# Patient Record
Sex: Female | Born: 1935 | Race: White | Hispanic: No | Smoking: Former smoker
Health system: Southern US, Community
[De-identification: ages and names within clinical notes are randomized; demographics above are authoritative.]

## PROBLEM LIST (undated history)

## (undated) DIAGNOSIS — G510 Bell's palsy: Secondary | ICD-10-CM

## (undated) DIAGNOSIS — Z9221 Personal history of antineoplastic chemotherapy: Secondary | ICD-10-CM

## (undated) DIAGNOSIS — Z7901 Long term (current) use of anticoagulants: Secondary | ICD-10-CM

## (undated) DIAGNOSIS — D689 Coagulation defect, unspecified: Secondary | ICD-10-CM

## (undated) DIAGNOSIS — I739 Peripheral vascular disease, unspecified: Secondary | ICD-10-CM

## (undated) DIAGNOSIS — I509 Heart failure, unspecified: Secondary | ICD-10-CM

## (undated) DIAGNOSIS — K529 Noninfective gastroenteritis and colitis, unspecified: Secondary | ICD-10-CM

## (undated) DIAGNOSIS — I480 Paroxysmal atrial fibrillation: Secondary | ICD-10-CM

## (undated) DIAGNOSIS — Z5189 Encounter for other specified aftercare: Secondary | ICD-10-CM

## (undated) DIAGNOSIS — K219 Gastro-esophageal reflux disease without esophagitis: Secondary | ICD-10-CM

## (undated) DIAGNOSIS — K922 Gastrointestinal hemorrhage, unspecified: Secondary | ICD-10-CM

## (undated) DIAGNOSIS — Z86711 Personal history of pulmonary embolism: Secondary | ICD-10-CM

## (undated) DIAGNOSIS — C3490 Malignant neoplasm of unspecified part of unspecified bronchus or lung: Secondary | ICD-10-CM

## (undated) DIAGNOSIS — E119 Type 2 diabetes mellitus without complications: Secondary | ICD-10-CM

## (undated) DIAGNOSIS — I10 Essential (primary) hypertension: Secondary | ICD-10-CM

## (undated) DIAGNOSIS — Z923 Personal history of irradiation: Secondary | ICD-10-CM

## (undated) DIAGNOSIS — C801 Malignant (primary) neoplasm, unspecified: Secondary | ICD-10-CM

## (undated) HISTORY — PX: CARPAL TUNNEL RELEASE: SHX101

## (undated) HISTORY — DX: Encounter for other specified aftercare: Z51.89

## (undated) HISTORY — DX: Gastro-esophageal reflux disease without esophagitis: K21.9

## (undated) HISTORY — PX: BLADDER SURGERY: SHX569

## (undated) HISTORY — DX: Noninfective gastroenteritis and colitis, unspecified: K52.9

## (undated) HISTORY — DX: Gastrointestinal hemorrhage, unspecified: K92.2

## (undated) HISTORY — PX: ABDOMINAL HYSTERECTOMY: SHX81

## (undated) HISTORY — DX: Malignant neoplasm of unspecified part of unspecified bronchus or lung: C34.90

## (undated) HISTORY — PX: OTHER SURGICAL HISTORY: SHX169

## (undated) HISTORY — PX: LUMBAR SPINE SURGERY: SHX701

## (undated) HISTORY — DX: Peripheral vascular disease, unspecified: I73.9

## (undated) HISTORY — DX: Coagulation defect, unspecified: D68.9

## (undated) HISTORY — DX: Paroxysmal atrial fibrillation: I48.0

## (undated) HISTORY — DX: Long term (current) use of anticoagulants: Z79.01

## (undated) HISTORY — DX: Personal history of antineoplastic chemotherapy: Z92.21

## (undated) HISTORY — PX: APPENDECTOMY: SHX54

## (undated) HISTORY — DX: Personal history of irradiation: Z92.3

## (undated) HISTORY — DX: Bell's palsy: G51.0

## (undated) HISTORY — DX: Personal history of pulmonary embolism: Z86.711

---

## 1997-12-31 ENCOUNTER — Emergency Department (HOSPITAL_COMMUNITY): Admission: EM | Admit: 1997-12-31 | Discharge: 1997-12-31 | Payer: Self-pay | Admitting: Emergency Medicine

## 1999-06-12 HISTORY — PX: CORONARY ANGIOPLASTY WITH STENT PLACEMENT: SHX49

## 2000-01-04 ENCOUNTER — Encounter: Payer: Self-pay | Admitting: Cardiovascular Disease

## 2000-01-04 ENCOUNTER — Ambulatory Visit: Admission: RE | Admit: 2000-01-04 | Discharge: 2000-01-04 | Payer: Self-pay | Admitting: Cardiovascular Disease

## 2000-01-30 ENCOUNTER — Observation Stay (HOSPITAL_COMMUNITY): Admission: RE | Admit: 2000-01-30 | Discharge: 2000-01-31 | Payer: Self-pay | Admitting: Cardiovascular Disease

## 2001-03-02 ENCOUNTER — Emergency Department (HOSPITAL_COMMUNITY): Admission: EM | Admit: 2001-03-02 | Discharge: 2001-03-02 | Payer: Self-pay | Admitting: Emergency Medicine

## 2002-11-02 ENCOUNTER — Ambulatory Visit (HOSPITAL_BASED_OUTPATIENT_CLINIC_OR_DEPARTMENT_OTHER): Admission: RE | Admit: 2002-11-02 | Discharge: 2002-11-02 | Payer: Self-pay | Admitting: Cardiovascular Disease

## 2003-04-12 HISTORY — PX: SHOULDER OPEN ROTATOR CUFF REPAIR: SHX2407

## 2003-04-20 ENCOUNTER — Encounter: Admission: RE | Admit: 2003-04-20 | Discharge: 2003-04-20 | Payer: Self-pay | Admitting: Orthopedic Surgery

## 2003-04-21 ENCOUNTER — Ambulatory Visit (HOSPITAL_COMMUNITY): Admission: RE | Admit: 2003-04-21 | Discharge: 2003-04-21 | Payer: Self-pay | Admitting: Orthopedic Surgery

## 2003-04-21 ENCOUNTER — Ambulatory Visit (HOSPITAL_BASED_OUTPATIENT_CLINIC_OR_DEPARTMENT_OTHER): Admission: RE | Admit: 2003-04-21 | Discharge: 2003-04-21 | Payer: Self-pay | Admitting: Orthopedic Surgery

## 2004-06-11 HISTORY — PX: OTHER SURGICAL HISTORY: SHX169

## 2004-12-24 ENCOUNTER — Emergency Department (HOSPITAL_COMMUNITY): Admission: EM | Admit: 2004-12-24 | Discharge: 2004-12-24 | Payer: Self-pay | Admitting: Emergency Medicine

## 2005-02-01 ENCOUNTER — Inpatient Hospital Stay (HOSPITAL_COMMUNITY): Admission: RE | Admit: 2005-02-01 | Discharge: 2005-02-05 | Payer: Self-pay | Admitting: Specialist

## 2005-04-09 ENCOUNTER — Encounter: Admission: RE | Admit: 2005-04-09 | Discharge: 2005-04-09 | Payer: Self-pay | Admitting: Specialist

## 2005-04-10 ENCOUNTER — Inpatient Hospital Stay (HOSPITAL_COMMUNITY): Admission: EM | Admit: 2005-04-10 | Discharge: 2005-04-23 | Payer: Self-pay | Admitting: Emergency Medicine

## 2005-04-11 ENCOUNTER — Ambulatory Visit: Payer: Self-pay | Admitting: Pulmonary Disease

## 2005-04-16 ENCOUNTER — Encounter (INDEPENDENT_AMBULATORY_CARE_PROVIDER_SITE_OTHER): Payer: Self-pay | Admitting: *Deleted

## 2005-04-19 ENCOUNTER — Ambulatory Visit: Payer: Self-pay | Admitting: Oncology

## 2005-04-24 ENCOUNTER — Ambulatory Visit: Payer: Self-pay | Admitting: Oncology

## 2005-05-24 ENCOUNTER — Ambulatory Visit (HOSPITAL_COMMUNITY): Admission: RE | Admit: 2005-05-24 | Discharge: 2005-05-24 | Payer: Self-pay | Admitting: Oncology

## 2005-06-11 HISTORY — PX: OTHER SURGICAL HISTORY: SHX169

## 2005-06-13 ENCOUNTER — Ambulatory Visit: Payer: Self-pay | Admitting: Oncology

## 2005-06-14 ENCOUNTER — Ambulatory Visit (HOSPITAL_COMMUNITY): Admission: RE | Admit: 2005-06-14 | Discharge: 2005-06-14 | Payer: Self-pay | Admitting: Thoracic Surgery

## 2005-07-18 ENCOUNTER — Encounter: Admission: RE | Admit: 2005-07-18 | Discharge: 2005-07-18 | Payer: Self-pay | Admitting: Thoracic Surgery

## 2005-07-30 ENCOUNTER — Ambulatory Visit: Payer: Self-pay | Admitting: Oncology

## 2005-08-09 ENCOUNTER — Inpatient Hospital Stay (HOSPITAL_COMMUNITY): Admission: RE | Admit: 2005-08-09 | Discharge: 2005-08-15 | Payer: Self-pay | Admitting: Thoracic Surgery

## 2005-08-09 ENCOUNTER — Ambulatory Visit: Payer: Self-pay | Admitting: Oncology

## 2005-08-09 ENCOUNTER — Encounter (INDEPENDENT_AMBULATORY_CARE_PROVIDER_SITE_OTHER): Payer: Self-pay | Admitting: *Deleted

## 2005-08-22 ENCOUNTER — Encounter: Admission: RE | Admit: 2005-08-22 | Discharge: 2005-08-22 | Payer: Self-pay | Admitting: Thoracic Surgery

## 2005-09-05 ENCOUNTER — Encounter: Admission: RE | Admit: 2005-09-05 | Discharge: 2005-09-05 | Payer: Self-pay | Admitting: Thoracic Surgery

## 2005-09-14 ENCOUNTER — Ambulatory Visit: Payer: Self-pay | Admitting: Oncology

## 2005-09-21 LAB — CBC WITH DIFFERENTIAL/PLATELET
Basophils Absolute: 0 10*3/uL (ref 0.0–0.1)
HCT: 40.4 % (ref 34.8–46.6)
HGB: 13.5 g/dL (ref 11.6–15.9)
MCH: 30.9 pg (ref 26.0–34.0)
MONO#: 0.7 10*3/uL (ref 0.1–0.9)
NEUT%: 69.6 % (ref 39.6–76.8)
WBC: 10.4 10*3/uL — ABNORMAL HIGH (ref 3.9–10.0)
lymph#: 2.2 10*3/uL (ref 0.9–3.3)

## 2005-09-21 LAB — PROTIME-INR

## 2005-10-01 LAB — PROTIME-INR: INR: 2.7 (ref 2.00–3.50)

## 2005-10-08 LAB — PROTIME-INR: Protime: 16.1 Seconds — ABNORMAL HIGH (ref 10.6–13.4)

## 2005-10-19 LAB — COMPREHENSIVE METABOLIC PANEL
ALT: 18 U/L (ref 0–40)
AST: 20 U/L (ref 0–37)
Albumin: 4 g/dL (ref 3.5–5.2)
Alkaline Phosphatase: 97 U/L (ref 39–117)
BUN: 10 mg/dL (ref 6–23)
Calcium: 9.5 mg/dL (ref 8.4–10.5)
Chloride: 103 mEq/L (ref 96–112)
Potassium: 4.2 mEq/L (ref 3.5–5.3)
Sodium: 140 mEq/L (ref 135–145)

## 2005-10-19 LAB — CBC WITH DIFFERENTIAL/PLATELET
BASO%: 0.3 % (ref 0.0–2.0)
EOS%: 1.7 % (ref 0.0–7.0)
HCT: 41.4 % (ref 34.8–46.6)
LYMPH%: 20.9 % (ref 14.0–48.0)
MCH: 29.6 pg (ref 26.0–34.0)
MCHC: 33.1 g/dL (ref 32.0–36.0)
MONO%: 7 % (ref 0.0–13.0)
NEUT%: 70.1 % (ref 39.6–76.8)
Platelets: 237 10*3/uL (ref 145–400)
RBC: 4.63 10*6/uL (ref 3.70–5.32)
WBC: 8.2 10*3/uL (ref 3.9–10.0)

## 2005-10-19 LAB — PROTIME-INR
INR: 2 (ref 2.00–3.50)
Protime: 17.6 Seconds — ABNORMAL HIGH (ref 10.6–13.4)

## 2005-10-24 ENCOUNTER — Encounter: Admission: RE | Admit: 2005-10-24 | Discharge: 2005-10-24 | Payer: Self-pay | Admitting: Thoracic Surgery

## 2005-10-31 ENCOUNTER — Ambulatory Visit: Payer: Self-pay | Admitting: Oncology

## 2005-11-02 ENCOUNTER — Ambulatory Visit (HOSPITAL_COMMUNITY): Admission: RE | Admit: 2005-11-02 | Discharge: 2005-11-02 | Payer: Self-pay | Admitting: Oncology

## 2005-11-02 LAB — PROTIME-INR: INR: 2.7 (ref 2.00–3.50)

## 2005-11-16 LAB — CBC WITH DIFFERENTIAL/PLATELET
BASO%: 0.2 % (ref 0.0–2.0)
Eosinophils Absolute: 0.1 10*3/uL (ref 0.0–0.5)
HCT: 41 % (ref 34.8–46.6)
LYMPH%: 22.7 % (ref 14.0–48.0)
MONO#: 0.6 10*3/uL (ref 0.1–0.9)
NEUT#: 7.2 10*3/uL — ABNORMAL HIGH (ref 1.5–6.5)
NEUT%: 70.1 % (ref 39.6–76.8)
Platelets: 246 10*3/uL (ref 145–400)
WBC: 10.2 10*3/uL — ABNORMAL HIGH (ref 3.9–10.0)
lymph#: 2.3 10*3/uL (ref 0.9–3.3)

## 2005-11-16 LAB — COMPREHENSIVE METABOLIC PANEL
ALT: 13 U/L (ref 0–40)
Albumin: 4.2 g/dL (ref 3.5–5.2)
Alkaline Phosphatase: 105 U/L (ref 39–117)
CO2: 28 mEq/L (ref 19–32)
Glucose, Bld: 141 mg/dL — ABNORMAL HIGH (ref 70–99)
Potassium: 4.1 mEq/L (ref 3.5–5.3)
Sodium: 140 mEq/L (ref 135–145)
Total Bilirubin: 0.4 mg/dL (ref 0.3–1.2)
Total Protein: 6.6 g/dL (ref 6.0–8.3)

## 2005-11-16 LAB — PROTIME-INR: Protime: 16.4 Seconds — ABNORMAL HIGH (ref 10.6–13.4)

## 2006-01-10 ENCOUNTER — Ambulatory Visit: Payer: Self-pay | Admitting: Oncology

## 2006-01-14 LAB — PROTIME-INR
INR: 2.2 (ref 2.00–3.50)
Protime: 18.2 Seconds — ABNORMAL HIGH (ref 10.6–13.4)

## 2006-01-14 LAB — CBC WITH DIFFERENTIAL/PLATELET
Basophils Absolute: 0 10*3/uL (ref 0.0–0.1)
EOS%: 2 % (ref 0.0–7.0)
HGB: 13.7 g/dL (ref 11.6–15.9)
MCH: 29.8 pg (ref 26.0–34.0)
MCV: 87.5 fL (ref 81.0–101.0)
MONO%: 5.7 % (ref 0.0–13.0)
NEUT#: 7.3 10*3/uL — ABNORMAL HIGH (ref 1.5–6.5)
RBC: 4.62 10*6/uL (ref 3.70–5.32)
RDW: 17.4 % — ABNORMAL HIGH (ref 11.3–14.5)
lymph#: 2.1 10*3/uL (ref 0.9–3.3)

## 2006-01-14 LAB — COMPREHENSIVE METABOLIC PANEL
ALT: 21 U/L (ref 0–40)
AST: 16 U/L (ref 0–37)
Alkaline Phosphatase: 94 U/L (ref 39–117)
BUN: 18 mg/dL (ref 6–23)
Creatinine, Ser: 0.89 mg/dL (ref 0.40–1.20)
Total Bilirubin: 0.4 mg/dL (ref 0.3–1.2)

## 2006-01-16 ENCOUNTER — Encounter: Admission: RE | Admit: 2006-01-16 | Discharge: 2006-01-16 | Payer: Self-pay | Admitting: Thoracic Surgery

## 2006-03-07 ENCOUNTER — Ambulatory Visit: Payer: Self-pay | Admitting: Oncology

## 2006-03-11 LAB — PROTIME-INR
INR: 1.7 — ABNORMAL LOW (ref 2.00–3.50)
Protime: 20.4 Seconds — ABNORMAL HIGH (ref 10.6–13.4)

## 2006-03-11 LAB — CBC WITH DIFFERENTIAL/PLATELET
BASO%: 0.3 % (ref 0.0–2.0)
EOS%: 2.8 % (ref 0.0–7.0)
Eosinophils Absolute: 0.3 10*3/uL (ref 0.0–0.5)
LYMPH%: 18.7 % (ref 14.0–48.0)
MCHC: 34 g/dL (ref 32.0–36.0)
MCV: 89.7 fL (ref 81.0–101.0)
MONO%: 5 % (ref 0.0–13.0)
NEUT#: 8.3 10*3/uL — ABNORMAL HIGH (ref 1.5–6.5)
Platelets: 236 10*3/uL (ref 145–400)
RBC: 4.59 10*6/uL (ref 3.70–5.32)
RDW: 14.9 % — ABNORMAL HIGH (ref 11.3–14.5)

## 2006-03-11 LAB — COMPREHENSIVE METABOLIC PANEL
ALT: 20 U/L (ref 0–40)
AST: 16 U/L (ref 0–37)
Albumin: 4 g/dL (ref 3.5–5.2)
BUN: 19 mg/dL (ref 6–23)
CO2: 24 mEq/L (ref 19–32)
Calcium: 9.3 mg/dL (ref 8.4–10.5)
Chloride: 102 mEq/L (ref 96–112)
Creatinine, Ser: 0.93 mg/dL (ref 0.40–1.20)
Potassium: 3.8 mEq/L (ref 3.5–5.3)

## 2006-03-11 LAB — LACTATE DEHYDROGENASE: LDH: 113 U/L (ref 94–250)

## 2006-05-23 ENCOUNTER — Ambulatory Visit: Payer: Self-pay | Admitting: Oncology

## 2006-05-27 LAB — CBC WITH DIFFERENTIAL/PLATELET
BASO%: 0.4 % (ref 0.0–2.0)
Eosinophils Absolute: 0.2 10*3/uL (ref 0.0–0.5)
HCT: 41.9 % (ref 34.8–46.6)
MCHC: 33.4 g/dL (ref 32.0–36.0)
MONO#: 0.4 10*3/uL (ref 0.1–0.9)
NEUT#: 6.5 10*3/uL (ref 1.5–6.5)
RBC: 4.67 10*6/uL (ref 3.70–5.32)
WBC: 9.5 10*3/uL (ref 3.9–10.0)
lymph#: 2.3 10*3/uL (ref 0.9–3.3)

## 2006-05-27 LAB — COMPREHENSIVE METABOLIC PANEL
AST: 15 U/L (ref 0–37)
Albumin: 4 g/dL (ref 3.5–5.2)
Alkaline Phosphatase: 103 U/L (ref 39–117)
BUN: 18 mg/dL (ref 6–23)
Calcium: 8.8 mg/dL (ref 8.4–10.5)
Chloride: 102 mEq/L (ref 96–112)
Glucose, Bld: 150 mg/dL — ABNORMAL HIGH (ref 70–99)
Potassium: 4.1 mEq/L (ref 3.5–5.3)
Sodium: 138 mEq/L (ref 135–145)
Total Protein: 6.5 g/dL (ref 6.0–8.3)

## 2006-05-27 LAB — PROTIME-INR: INR: 2.2 (ref 2.00–3.50)

## 2006-06-19 ENCOUNTER — Encounter: Admission: RE | Admit: 2006-06-19 | Discharge: 2006-06-19 | Payer: Self-pay | Admitting: Thoracic Surgery

## 2006-07-05 ENCOUNTER — Emergency Department (HOSPITAL_COMMUNITY): Admission: EM | Admit: 2006-07-05 | Discharge: 2006-07-06 | Payer: Self-pay | Admitting: Emergency Medicine

## 2006-07-17 ENCOUNTER — Encounter: Admission: RE | Admit: 2006-07-17 | Discharge: 2006-07-17 | Payer: Self-pay | Admitting: Specialist

## 2006-07-17 ENCOUNTER — Ambulatory Visit: Payer: Self-pay | Admitting: Oncology

## 2006-07-19 LAB — COMPREHENSIVE METABOLIC PANEL
ALT: 24 U/L (ref 0–35)
AST: 20 U/L (ref 0–37)
CO2: 24 mEq/L (ref 19–32)
Calcium: 9.3 mg/dL (ref 8.4–10.5)
Chloride: 102 mEq/L (ref 96–112)
Sodium: 139 mEq/L (ref 135–145)
Total Bilirubin: 0.3 mg/dL (ref 0.3–1.2)
Total Protein: 7.2 g/dL (ref 6.0–8.3)

## 2006-07-19 LAB — CBC WITH DIFFERENTIAL/PLATELET
Basophils Absolute: 0.1 10*3/uL (ref 0.0–0.1)
HCT: 43.2 % (ref 34.8–46.6)
HGB: 14.8 g/dL (ref 11.6–15.9)
MONO#: 1 10*3/uL — ABNORMAL HIGH (ref 0.1–0.9)
NEUT#: 10.6 10*3/uL — ABNORMAL HIGH (ref 1.5–6.5)
NEUT%: 72.8 % (ref 39.6–76.8)
WBC: 14.5 10*3/uL — ABNORMAL HIGH (ref 3.9–10.0)
lymph#: 2.8 10*3/uL (ref 0.9–3.3)

## 2006-07-19 LAB — PROTIME-INR

## 2006-08-08 ENCOUNTER — Encounter: Admission: RE | Admit: 2006-08-08 | Discharge: 2006-08-08 | Payer: Self-pay | Admitting: Specialist

## 2006-08-22 ENCOUNTER — Encounter: Admission: RE | Admit: 2006-08-22 | Discharge: 2006-08-22 | Payer: Self-pay | Admitting: Specialist

## 2006-10-15 ENCOUNTER — Ambulatory Visit: Payer: Self-pay | Admitting: Oncology

## 2006-10-17 LAB — CBC WITH DIFFERENTIAL/PLATELET
BASO%: 0.4 % (ref 0.0–2.0)
Basophils Absolute: 0 10*3/uL (ref 0.0–0.1)
HCT: 41 % (ref 34.8–46.6)
HGB: 14.1 g/dL (ref 11.6–15.9)
LYMPH%: 25.9 % (ref 14.0–48.0)
MCHC: 34.4 g/dL (ref 32.0–36.0)
MONO#: 0.6 10*3/uL (ref 0.1–0.9)
NEUT%: 65.1 % (ref 39.6–76.8)
Platelets: 232 10*3/uL (ref 145–400)
WBC: 9.3 10*3/uL (ref 3.9–10.0)
lymph#: 2.4 10*3/uL (ref 0.9–3.3)

## 2006-10-17 LAB — COMPREHENSIVE METABOLIC PANEL
ALT: 13 U/L (ref 0–35)
BUN: 20 mg/dL (ref 6–23)
CO2: 24 mEq/L (ref 19–32)
Calcium: 8.7 mg/dL (ref 8.4–10.5)
Chloride: 103 mEq/L (ref 96–112)
Creatinine, Ser: 0.82 mg/dL (ref 0.40–1.20)
Glucose, Bld: 135 mg/dL — ABNORMAL HIGH (ref 70–99)
Total Bilirubin: 0.4 mg/dL (ref 0.3–1.2)

## 2006-10-17 LAB — LACTATE DEHYDROGENASE: LDH: 156 U/L (ref 94–250)

## 2006-11-25 ENCOUNTER — Encounter: Admission: RE | Admit: 2006-11-25 | Discharge: 2006-11-25 | Payer: Self-pay | Admitting: Specialist

## 2006-11-26 ENCOUNTER — Inpatient Hospital Stay (HOSPITAL_COMMUNITY): Admission: RE | Admit: 2006-11-26 | Discharge: 2006-12-02 | Payer: Self-pay | Admitting: Specialist

## 2006-12-02 ENCOUNTER — Ambulatory Visit: Payer: Self-pay | Admitting: Physical Medicine & Rehabilitation

## 2007-01-14 ENCOUNTER — Ambulatory Visit: Payer: Self-pay | Admitting: Oncology

## 2007-01-16 LAB — CBC WITH DIFFERENTIAL/PLATELET
Basophils Absolute: 0 10*3/uL (ref 0.0–0.1)
EOS%: 2.4 % (ref 0.0–7.0)
Eosinophils Absolute: 0.2 10*3/uL (ref 0.0–0.5)
HCT: 38.3 % (ref 34.8–46.6)
HGB: 13.1 g/dL (ref 11.6–15.9)
MCH: 28.7 pg (ref 26.0–34.0)
MONO#: 0.6 10*3/uL (ref 0.1–0.9)
NEUT#: 5.6 10*3/uL (ref 1.5–6.5)
NEUT%: 67.4 % (ref 39.6–76.8)
RDW: 14.2 % (ref 11.3–14.5)
WBC: 8.3 10*3/uL (ref 3.9–10.0)
lymph#: 1.9 10*3/uL (ref 0.9–3.3)

## 2007-01-16 LAB — COMPREHENSIVE METABOLIC PANEL
ALT: 9 U/L (ref 0–35)
AST: 12 U/L (ref 0–37)
Albumin: 3.9 g/dL (ref 3.5–5.2)
BUN: 12 mg/dL (ref 6–23)
CO2: 26 mEq/L (ref 19–32)
Calcium: 9.2 mg/dL (ref 8.4–10.5)
Chloride: 105 mEq/L (ref 96–112)
Creatinine, Ser: 0.78 mg/dL (ref 0.40–1.20)
Potassium: 4.4 mEq/L (ref 3.5–5.3)

## 2007-01-16 LAB — LACTATE DEHYDROGENASE: LDH: 154 U/L (ref 94–250)

## 2007-01-21 ENCOUNTER — Ambulatory Visit: Payer: Self-pay | Admitting: Thoracic Surgery

## 2007-01-21 ENCOUNTER — Encounter: Admission: RE | Admit: 2007-01-21 | Discharge: 2007-01-21 | Payer: Self-pay | Admitting: Thoracic Surgery

## 2007-04-16 ENCOUNTER — Ambulatory Visit: Payer: Self-pay | Admitting: Oncology

## 2007-04-18 LAB — COMPREHENSIVE METABOLIC PANEL
ALT: 9 U/L (ref 0–35)
AST: 15 U/L (ref 0–37)
Creatinine, Ser: 0.83 mg/dL (ref 0.40–1.20)
Total Bilirubin: 0.4 mg/dL (ref 0.3–1.2)

## 2007-04-18 LAB — CBC WITH DIFFERENTIAL/PLATELET
BASO%: 0.5 % (ref 0.0–2.0)
EOS%: 2.8 % (ref 0.0–7.0)
HCT: 39.6 % (ref 34.8–46.6)
LYMPH%: 27 % (ref 14.0–48.0)
MCH: 27.9 pg (ref 26.0–34.0)
MCHC: 33.2 g/dL (ref 32.0–36.0)
NEUT%: 62.7 % (ref 39.6–76.8)
Platelets: 212 10*3/uL (ref 145–400)
lymph#: 2.1 10*3/uL (ref 0.9–3.3)

## 2007-07-17 ENCOUNTER — Ambulatory Visit: Payer: Self-pay | Admitting: Oncology

## 2007-07-21 LAB — CBC WITH DIFFERENTIAL/PLATELET
Basophils Absolute: 0.1 10*3/uL (ref 0.0–0.1)
Eosinophils Absolute: 0.2 10*3/uL (ref 0.0–0.5)
HCT: 44.1 % (ref 34.8–46.6)
HGB: 14.4 g/dL (ref 11.6–15.9)
LYMPH%: 28.3 % (ref 14.0–48.0)
MCV: 85.1 fL (ref 81.0–101.0)
MONO#: 0.5 10*3/uL (ref 0.1–0.9)
MONO%: 5.9 % (ref 0.0–13.0)
NEUT#: 5.3 10*3/uL (ref 1.5–6.5)
Platelets: 219 10*3/uL (ref 145–400)
RBC: 5.18 10*6/uL (ref 3.70–5.32)
WBC: 8.4 10*3/uL (ref 3.9–10.0)

## 2007-07-21 LAB — COMPREHENSIVE METABOLIC PANEL
Albumin: 4.4 g/dL (ref 3.5–5.2)
Alkaline Phosphatase: 76 U/L (ref 39–117)
BUN: 17 mg/dL (ref 6–23)
CO2: 26 mEq/L (ref 19–32)
Glucose, Bld: 123 mg/dL — ABNORMAL HIGH (ref 70–99)
Sodium: 139 mEq/L (ref 135–145)
Total Bilirubin: 0.6 mg/dL (ref 0.3–1.2)
Total Protein: 7.1 g/dL (ref 6.0–8.3)

## 2007-07-21 LAB — LACTATE DEHYDROGENASE: LDH: 141 U/L (ref 94–250)

## 2007-08-05 ENCOUNTER — Encounter: Admission: RE | Admit: 2007-08-05 | Discharge: 2007-08-05 | Payer: Self-pay | Admitting: Thoracic Surgery

## 2007-08-05 ENCOUNTER — Ambulatory Visit: Payer: Self-pay | Admitting: Thoracic Surgery

## 2007-08-18 ENCOUNTER — Encounter: Admission: RE | Admit: 2007-08-18 | Discharge: 2007-08-18 | Payer: Self-pay | Admitting: Specialist

## 2007-10-07 ENCOUNTER — Inpatient Hospital Stay (HOSPITAL_COMMUNITY): Admission: RE | Admit: 2007-10-07 | Discharge: 2007-10-13 | Payer: Self-pay | Admitting: Specialist

## 2007-11-14 ENCOUNTER — Ambulatory Visit: Payer: Self-pay | Admitting: Oncology

## 2007-11-18 LAB — COMPREHENSIVE METABOLIC PANEL
AST: 12 U/L (ref 0–37)
Alkaline Phosphatase: 69 U/L (ref 39–117)
BUN: 15 mg/dL (ref 6–23)
Creatinine, Ser: 0.83 mg/dL (ref 0.40–1.20)
Glucose, Bld: 134 mg/dL — ABNORMAL HIGH (ref 70–99)
Potassium: 4 mEq/L (ref 3.5–5.3)
Total Bilirubin: 0.4 mg/dL (ref 0.3–1.2)

## 2007-11-18 LAB — CBC WITH DIFFERENTIAL/PLATELET
Basophils Absolute: 0 10*3/uL (ref 0.0–0.1)
EOS%: 3.7 % (ref 0.0–7.0)
Eosinophils Absolute: 0.3 10*3/uL (ref 0.0–0.5)
HCT: 40.3 % (ref 34.8–46.6)
HGB: 13.4 g/dL (ref 11.6–15.9)
LYMPH%: 24.8 % (ref 14.0–48.0)
MCH: 27.8 pg (ref 26.0–34.0)
MCV: 83.5 fL (ref 81.0–101.0)
MONO%: 5.4 % (ref 0.0–13.0)
NEUT#: 6.3 10*3/uL (ref 1.5–6.5)
NEUT%: 65.8 % (ref 39.6–76.8)
Platelets: 234 10*3/uL (ref 145–400)

## 2007-11-24 ENCOUNTER — Emergency Department (HOSPITAL_COMMUNITY): Admission: EM | Admit: 2007-11-24 | Discharge: 2007-11-25 | Payer: Self-pay | Admitting: Emergency Medicine

## 2007-12-08 ENCOUNTER — Encounter: Admission: RE | Admit: 2007-12-08 | Discharge: 2007-12-08 | Payer: Self-pay | Admitting: Specialist

## 2008-01-28 ENCOUNTER — Ambulatory Visit: Payer: Self-pay | Admitting: Thoracic Surgery

## 2008-01-28 ENCOUNTER — Encounter: Admission: RE | Admit: 2008-01-28 | Discharge: 2008-01-28 | Payer: Self-pay | Admitting: Thoracic Surgery

## 2008-03-17 ENCOUNTER — Ambulatory Visit: Payer: Self-pay | Admitting: Oncology

## 2008-03-19 ENCOUNTER — Ambulatory Visit (HOSPITAL_COMMUNITY): Admission: RE | Admit: 2008-03-19 | Discharge: 2008-03-19 | Payer: Self-pay | Admitting: Oncology

## 2008-03-19 LAB — COMPREHENSIVE METABOLIC PANEL
ALT: 14 U/L (ref 0–35)
AST: 17 U/L (ref 0–37)
Alkaline Phosphatase: 50 U/L (ref 39–117)
BUN: 19 mg/dL (ref 6–23)
Creatinine, Ser: 0.97 mg/dL (ref 0.40–1.20)

## 2008-03-19 LAB — CBC WITH DIFFERENTIAL/PLATELET
BASO%: 0.3 % (ref 0.0–2.0)
Basophils Absolute: 0 10*3/uL (ref 0.0–0.1)
EOS%: 2.9 % (ref 0.0–7.0)
HCT: 44.3 % (ref 34.8–46.6)
HGB: 15.1 g/dL (ref 11.6–15.9)
MCH: 30.6 pg (ref 26.0–34.0)
MCHC: 34.2 g/dL (ref 32.0–36.0)
MCV: 89.3 fL (ref 81.0–101.0)
MONO%: 5.3 % (ref 0.0–13.0)
NEUT%: 69.4 % (ref 39.6–76.8)
RDW: 14.9 % — ABNORMAL HIGH (ref 11.3–14.5)
lymph#: 2.2 10*3/uL (ref 0.9–3.3)

## 2008-07-14 ENCOUNTER — Ambulatory Visit (HOSPITAL_BASED_OUTPATIENT_CLINIC_OR_DEPARTMENT_OTHER): Admission: RE | Admit: 2008-07-14 | Discharge: 2008-07-14 | Payer: Self-pay | Admitting: *Deleted

## 2008-07-16 ENCOUNTER — Ambulatory Visit: Payer: Self-pay | Admitting: Oncology

## 2008-07-26 LAB — CBC WITH DIFFERENTIAL/PLATELET
BASO%: 0.2 % (ref 0.0–2.0)
Eosinophils Absolute: 0.3 10*3/uL (ref 0.0–0.5)
HCT: 45 % (ref 34.8–46.6)
MCHC: 34 g/dL (ref 32.0–36.0)
MONO#: 0.7 10*3/uL (ref 0.1–0.9)
NEUT#: 6.4 10*3/uL (ref 1.5–6.5)
NEUT%: 65.7 % (ref 39.6–76.8)
WBC: 9.7 10*3/uL (ref 3.9–10.0)
lymph#: 2.3 10*3/uL (ref 0.9–3.3)

## 2008-07-26 LAB — COMPREHENSIVE METABOLIC PANEL
ALT: 13 U/L (ref 0–35)
CO2: 26 mEq/L (ref 19–32)
Calcium: 10.8 mg/dL — ABNORMAL HIGH (ref 8.4–10.5)
Chloride: 102 mEq/L (ref 96–112)
Creatinine, Ser: 0.92 mg/dL (ref 0.40–1.20)
Glucose, Bld: 101 mg/dL — ABNORMAL HIGH (ref 70–99)
Sodium: 139 mEq/L (ref 135–145)
Total Protein: 7.1 g/dL (ref 6.0–8.3)

## 2008-07-26 LAB — LACTATE DEHYDROGENASE: LDH: 144 U/L (ref 94–250)

## 2008-08-09 ENCOUNTER — Ambulatory Visit (HOSPITAL_COMMUNITY): Admission: RE | Admit: 2008-08-09 | Discharge: 2008-08-09 | Payer: Self-pay | Admitting: Oncology

## 2008-08-25 ENCOUNTER — Ambulatory Visit: Payer: Self-pay | Admitting: Thoracic Surgery

## 2008-09-24 ENCOUNTER — Ambulatory Visit (HOSPITAL_COMMUNITY): Admission: RE | Admit: 2008-09-24 | Discharge: 2008-09-24 | Payer: Self-pay | Admitting: Oncology

## 2008-11-26 ENCOUNTER — Ambulatory Visit: Payer: Self-pay | Admitting: Oncology

## 2008-11-30 LAB — CBC WITH DIFFERENTIAL/PLATELET
Basophils Absolute: 0 10*3/uL (ref 0.0–0.1)
Eosinophils Absolute: 0.3 10*3/uL (ref 0.0–0.5)
HCT: 43.8 % (ref 34.8–46.6)
LYMPH%: 28.1 % (ref 14.0–49.7)
MCV: 91.8 fL (ref 79.5–101.0)
MONO#: 0.6 10*3/uL (ref 0.1–0.9)
MONO%: 7.6 % (ref 0.0–14.0)
NEUT#: 5 10*3/uL (ref 1.5–6.5)
NEUT%: 60.2 % (ref 38.4–76.8)
Platelets: 200 10*3/uL (ref 145–400)
WBC: 8.2 10*3/uL (ref 3.9–10.3)

## 2008-11-30 LAB — LACTATE DEHYDROGENASE: LDH: 134 U/L (ref 94–250)

## 2008-11-30 LAB — COMPREHENSIVE METABOLIC PANEL
Alkaline Phosphatase: 46 U/L (ref 39–117)
BUN: 16 mg/dL (ref 6–23)
CO2: 23 mEq/L (ref 19–32)
Creatinine, Ser: 0.92 mg/dL (ref 0.40–1.20)
Glucose, Bld: 95 mg/dL (ref 70–99)
Sodium: 141 mEq/L (ref 135–145)
Total Bilirubin: 0.4 mg/dL (ref 0.3–1.2)
Total Protein: 7.2 g/dL (ref 6.0–8.3)

## 2009-01-01 ENCOUNTER — Emergency Department (HOSPITAL_COMMUNITY): Admission: EM | Admit: 2009-01-01 | Discharge: 2009-01-01 | Payer: Self-pay | Admitting: Emergency Medicine

## 2009-03-02 ENCOUNTER — Encounter: Admission: RE | Admit: 2009-03-02 | Discharge: 2009-03-02 | Payer: Self-pay | Admitting: Thoracic Surgery

## 2009-03-02 ENCOUNTER — Ambulatory Visit: Payer: Self-pay | Admitting: Thoracic Surgery

## 2009-04-04 ENCOUNTER — Ambulatory Visit: Payer: Self-pay | Admitting: Oncology

## 2009-04-06 LAB — CBC WITH DIFFERENTIAL/PLATELET
Basophils Absolute: 0 10*3/uL (ref 0.0–0.1)
EOS%: 2.5 % (ref 0.0–7.0)
Eosinophils Absolute: 0.2 10*3/uL (ref 0.0–0.5)
LYMPH%: 24.3 % (ref 14.0–49.7)
MCH: 31.2 pg (ref 25.1–34.0)
MCV: 94 fL (ref 79.5–101.0)
MONO%: 5.8 % (ref 0.0–14.0)
NEUT#: 6.2 10*3/uL (ref 1.5–6.5)
Platelets: 198 10*3/uL (ref 145–400)
RBC: 4.68 10*6/uL (ref 3.70–5.45)
RDW: 13.5 % (ref 11.2–14.5)

## 2009-04-06 LAB — COMPREHENSIVE METABOLIC PANEL
AST: 18 U/L (ref 0–37)
Alkaline Phosphatase: 46 U/L (ref 39–117)
BUN: 19 mg/dL (ref 6–23)
Glucose, Bld: 170 mg/dL — ABNORMAL HIGH (ref 70–99)
Potassium: 4 mEq/L (ref 3.5–5.3)
Sodium: 142 mEq/L (ref 135–145)
Total Bilirubin: 0.4 mg/dL (ref 0.3–1.2)

## 2009-06-11 HISTORY — PX: OTHER SURGICAL HISTORY: SHX169

## 2009-08-08 ENCOUNTER — Ambulatory Visit: Payer: Self-pay | Admitting: Oncology

## 2009-08-08 LAB — LACTATE DEHYDROGENASE: LDH: 132 U/L (ref 94–250)

## 2009-08-08 LAB — COMPREHENSIVE METABOLIC PANEL
Alkaline Phosphatase: 44 U/L (ref 39–117)
Creatinine, Ser: 0.92 mg/dL (ref 0.40–1.20)
Glucose, Bld: 161 mg/dL — ABNORMAL HIGH (ref 70–99)
Sodium: 140 mEq/L (ref 135–145)
Total Bilirubin: 0.4 mg/dL (ref 0.3–1.2)
Total Protein: 7 g/dL (ref 6.0–8.3)

## 2009-08-08 LAB — CBC WITH DIFFERENTIAL/PLATELET
BASO%: 0.3 % (ref 0.0–2.0)
Eosinophils Absolute: 0.3 10*3/uL (ref 0.0–0.5)
HCT: 44.6 % (ref 34.8–46.6)
LYMPH%: 25.8 % (ref 14.0–49.7)
MCHC: 33.8 g/dL (ref 31.5–36.0)
MCV: 92.5 fL (ref 79.5–101.0)
MONO%: 4.9 % (ref 0.0–14.0)
NEUT%: 66.1 % (ref 38.4–76.8)
Platelets: 198 10*3/uL (ref 145–400)
RBC: 4.82 10*6/uL (ref 3.70–5.45)

## 2009-08-31 ENCOUNTER — Ambulatory Visit: Payer: Self-pay | Admitting: Thoracic Surgery

## 2009-08-31 ENCOUNTER — Encounter: Admission: RE | Admit: 2009-08-31 | Discharge: 2009-08-31 | Payer: Self-pay | Admitting: Thoracic Surgery

## 2009-12-01 ENCOUNTER — Ambulatory Visit: Payer: Self-pay | Admitting: Oncology

## 2009-12-05 LAB — COMPREHENSIVE METABOLIC PANEL
AST: 18 U/L (ref 0–37)
Albumin: 4.2 g/dL (ref 3.5–5.2)
Alkaline Phosphatase: 43 U/L (ref 39–117)
Potassium: 4.3 mEq/L (ref 3.5–5.3)
Sodium: 138 mEq/L (ref 135–145)
Total Protein: 6.6 g/dL (ref 6.0–8.3)

## 2009-12-05 LAB — CBC WITH DIFFERENTIAL/PLATELET
BASO%: 0.3 % (ref 0.0–2.0)
EOS%: 4 % (ref 0.0–7.0)
MCH: 31.4 pg (ref 25.1–34.0)
MCHC: 33.9 g/dL (ref 31.5–36.0)
MCV: 92.5 fL (ref 79.5–101.0)
MONO%: 7.5 % (ref 0.0–14.0)
NEUT#: 4.6 10*3/uL (ref 1.5–6.5)
RBC: 4.59 10*6/uL (ref 3.70–5.45)
RDW: 13.9 % (ref 11.2–14.5)

## 2010-01-17 ENCOUNTER — Encounter: Admission: RE | Admit: 2010-01-17 | Discharge: 2010-01-17 | Payer: Self-pay | Admitting: Family Medicine

## 2010-04-05 ENCOUNTER — Ambulatory Visit: Payer: Self-pay | Admitting: Oncology

## 2010-04-13 LAB — CBC WITH DIFFERENTIAL/PLATELET
BASO%: 0.3 % (ref 0.0–2.0)
Basophils Absolute: 0 10*3/uL (ref 0.0–0.1)
Eosinophils Absolute: 0.3 10*3/uL (ref 0.0–0.5)
HCT: 46.1 % (ref 34.8–46.6)
HGB: 15.3 g/dL (ref 11.6–15.9)
LYMPH%: 28 % (ref 14.0–49.7)
MCHC: 33.3 g/dL (ref 31.5–36.0)
MONO#: 0.5 10*3/uL (ref 0.1–0.9)
NEUT%: 62.6 % (ref 38.4–76.8)
Platelets: 210 10*3/uL (ref 145–400)
WBC: 9.1 10*3/uL (ref 3.9–10.3)
lymph#: 2.5 10*3/uL (ref 0.9–3.3)

## 2010-04-13 LAB — COMPREHENSIVE METABOLIC PANEL
ALT: 12 U/L (ref 0–35)
BUN: 23 mg/dL (ref 6–23)
CO2: 23 mEq/L (ref 19–32)
Calcium: 9.6 mg/dL (ref 8.4–10.5)
Chloride: 101 mEq/L (ref 96–112)
Creatinine, Ser: 1.05 mg/dL (ref 0.40–1.20)
Glucose, Bld: 183 mg/dL — ABNORMAL HIGH (ref 70–99)
Total Bilirubin: 0.4 mg/dL (ref 0.3–1.2)

## 2010-04-13 LAB — LACTATE DEHYDROGENASE: LDH: 133 U/L (ref 94–250)

## 2010-04-17 ENCOUNTER — Ambulatory Visit (HOSPITAL_COMMUNITY): Admission: RE | Admit: 2010-04-17 | Discharge: 2010-04-17 | Payer: Self-pay | Admitting: Oncology

## 2010-07-02 ENCOUNTER — Encounter (HOSPITAL_COMMUNITY): Payer: Self-pay | Admitting: Oncology

## 2010-07-02 ENCOUNTER — Encounter: Payer: Self-pay | Admitting: Thoracic Surgery

## 2010-08-03 ENCOUNTER — Other Ambulatory Visit: Payer: Self-pay | Admitting: Thoracic Surgery

## 2010-08-03 DIAGNOSIS — C3490 Malignant neoplasm of unspecified part of unspecified bronchus or lung: Secondary | ICD-10-CM

## 2010-08-14 ENCOUNTER — Encounter (HOSPITAL_BASED_OUTPATIENT_CLINIC_OR_DEPARTMENT_OTHER): Payer: Medicare Other | Admitting: Oncology

## 2010-08-14 ENCOUNTER — Other Ambulatory Visit (HOSPITAL_COMMUNITY): Payer: Self-pay | Admitting: Oncology

## 2010-08-14 DIAGNOSIS — C3430 Malignant neoplasm of lower lobe, unspecified bronchus or lung: Secondary | ICD-10-CM

## 2010-08-14 LAB — CBC WITH DIFFERENTIAL/PLATELET
BASO%: 0.4 % (ref 0.0–2.0)
Basophils Absolute: 0 10*3/uL (ref 0.0–0.1)
EOS%: 4 % (ref 0.0–7.0)
HCT: 42.7 % (ref 34.8–46.6)
HGB: 14.4 g/dL (ref 11.6–15.9)
MCH: 31.1 pg (ref 25.1–34.0)
MONO#: 0.7 10*3/uL (ref 0.1–0.9)
NEUT%: 56 % (ref 38.4–76.8)
RDW: 13.8 % (ref 11.2–14.5)
WBC: 7.7 10*3/uL (ref 3.9–10.3)
lymph#: 2.4 10*3/uL (ref 0.9–3.3)

## 2010-08-14 LAB — COMPREHENSIVE METABOLIC PANEL
AST: 19 U/L (ref 0–37)
Albumin: 4.3 g/dL (ref 3.5–5.2)
Alkaline Phosphatase: 42 U/L (ref 39–117)
BUN: 32 mg/dL — ABNORMAL HIGH (ref 6–23)
Calcium: 9.1 mg/dL (ref 8.4–10.5)
Chloride: 104 mEq/L (ref 96–112)
Glucose, Bld: 137 mg/dL — ABNORMAL HIGH (ref 70–99)
Potassium: 4.3 mEq/L (ref 3.5–5.3)
Sodium: 137 mEq/L (ref 135–145)
Total Protein: 6.5 g/dL (ref 6.0–8.3)

## 2010-08-30 ENCOUNTER — Ambulatory Visit (INDEPENDENT_AMBULATORY_CARE_PROVIDER_SITE_OTHER): Payer: Medicare Other | Admitting: Thoracic Surgery

## 2010-08-30 ENCOUNTER — Ambulatory Visit
Admission: RE | Admit: 2010-08-30 | Discharge: 2010-08-30 | Disposition: A | Discharge status: Home / Self Care | Payer: Medicare Other | Source: Ambulatory Visit | Attending: Thoracic Surgery | Admitting: Thoracic Surgery

## 2010-08-30 ENCOUNTER — Other Ambulatory Visit: Payer: Self-pay | Admitting: Thoracic Surgery

## 2010-08-30 DIAGNOSIS — C3490 Malignant neoplasm of unspecified part of unspecified bronchus or lung: Secondary | ICD-10-CM

## 2010-08-31 NOTE — Letter (Signed)
August 30, 2010  Samul Dada, M.D. [address] [facility] [place] [address]  Re:  [name], [name]                DOB:  05-11-1931  Dear Dr. Arline Asp:  The patient returns today.  Her CT scan unfortunately showed a new right upper lobe lesion that is about 3 cm in size.  She also has complaints of some back pain.  I am going to go ahead and order a PET scan on her and we will see her and will probably need to have a needle biopsy depending on what the PET scan shows.  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  I will keep you informed after we get her workup.  [name], M.D. Electronically Signed  DPB/MEDQ  D:  08/30/2010  T:  08/31/2010  Job:  [id number]

## 2010-09-07 ENCOUNTER — Encounter (HOSPITAL_COMMUNITY)
Admission: RE | Admit: 2010-09-07 | Discharge: 2010-09-07 | Disposition: A | Discharge status: Home / Self Care | Payer: Medicare Other | Source: Ambulatory Visit | Attending: Thoracic Surgery | Admitting: Thoracic Surgery

## 2010-09-07 DIAGNOSIS — E278 Other specified disorders of adrenal gland: Secondary | ICD-10-CM | POA: Insufficient documentation

## 2010-09-07 DIAGNOSIS — C3490 Malignant neoplasm of unspecified part of unspecified bronchus or lung: Secondary | ICD-10-CM | POA: Insufficient documentation

## 2010-09-07 DIAGNOSIS — Z9221 Personal history of antineoplastic chemotherapy: Secondary | ICD-10-CM | POA: Insufficient documentation

## 2010-09-07 DIAGNOSIS — Z85118 Personal history of other malignant neoplasm of bronchus and lung: Secondary | ICD-10-CM | POA: Insufficient documentation

## 2010-09-07 MED ORDER — FLUDEOXYGLUCOSE F - 18 (FDG) INJECTION
16.5000 | Freq: Once | INTRAVENOUS | Status: AC | PRN
  Administered 2010-09-07: 16.5 via INTRAVENOUS

## 2010-09-15 ENCOUNTER — Other Ambulatory Visit: Payer: Self-pay | Admitting: Thoracic Surgery

## 2010-09-15 DIAGNOSIS — R911 Solitary pulmonary nodule: Secondary | ICD-10-CM

## 2010-09-18 NOTE — Assessment & Plan Note (Signed)
OFFICE VISIT  [name], Jo E DOB:  May 02, 1931                                        September 13, 2010 CHART #:  [id number]  We called her daughter and said that the PET scan was positive in the right upper lobe nodule.  She also had some increased uptake in the left adrenal gland which was similar to the previous PET scane as well as a slight uptake in her liver which was also as been on her previous PET. She does have an enlarged liver.  I will go ahead and schedule for a needle biopsy.  She is on Coumadin, so will have to stop this 5 days before.  [name], M.D. Electronically Signed  DPB/MEDQ  D:  09/13/2010  T:  09/14/2010  Job:  [id number]

## 2010-09-21 ENCOUNTER — Other Ambulatory Visit: Payer: Self-pay | Admitting: Thoracic Surgery

## 2010-09-21 ENCOUNTER — Other Ambulatory Visit: Payer: Self-pay | Admitting: Interventional Radiology

## 2010-09-21 ENCOUNTER — Ambulatory Visit (HOSPITAL_COMMUNITY)
Admission: RE | Admit: 2010-09-21 | Discharge: 2010-09-21 | Disposition: A | Discharge status: Home / Self Care | Payer: Medicare Other | Source: Ambulatory Visit | Attending: Thoracic Surgery | Admitting: Thoracic Surgery

## 2010-09-21 DIAGNOSIS — R911 Solitary pulmonary nodule: Secondary | ICD-10-CM

## 2010-09-21 DIAGNOSIS — C3410 Malignant neoplasm of upper lobe, unspecified bronchus or lung: Secondary | ICD-10-CM | POA: Insufficient documentation

## 2010-09-21 DIAGNOSIS — J95811 Postprocedural pneumothorax: Secondary | ICD-10-CM

## 2010-09-21 DIAGNOSIS — Z01812 Encounter for preprocedural laboratory examination: Secondary | ICD-10-CM | POA: Insufficient documentation

## 2010-09-21 LAB — CBC
Hemoglobin: 14.2 g/dL (ref 12.0–15.0)
MCH: 30.5 pg (ref 26.0–34.0)
MCV: 93.8 fL (ref 78.0–100.0)
RBC: 4.66 MIL/uL (ref 3.87–5.11)
WBC: 7.9 10*3/uL (ref 4.0–10.5)

## 2010-09-21 LAB — PROTIME-INR: Prothrombin Time: 13.8 seconds (ref 11.6–15.2)

## 2010-09-21 LAB — APTT: aPTT: 25 seconds (ref 24–37)

## 2010-09-26 ENCOUNTER — Ambulatory Visit (INDEPENDENT_AMBULATORY_CARE_PROVIDER_SITE_OTHER): Payer: Medicare Other | Admitting: Thoracic Surgery

## 2010-09-26 DIAGNOSIS — D491 Neoplasm of unspecified behavior of respiratory system: Secondary | ICD-10-CM

## 2010-09-26 LAB — POCT I-STAT, CHEM 8
Calcium, Ion: 1.21 mmol/L (ref 1.12–1.32)
Glucose, Bld: 130 mg/dL — ABNORMAL HIGH (ref 70–99)
HCT: 46 % (ref 36.0–46.0)
Hemoglobin: 15.6 g/dL — ABNORMAL HIGH (ref 12.0–15.0)
Potassium: 3.8 mEq/L (ref 3.5–5.1)
TCO2: 24 mmol/L (ref 0–100)

## 2010-09-26 LAB — PROTIME-INR
INR: 0.9 (ref 0.00–1.49)
Prothrombin Time: 12.3 seconds (ref 11.6–15.2)

## 2010-09-27 NOTE — Letter (Signed)
September 26, 2010    Re:  [name], [name]                DOB:  December 17, 1930  The patient returns today after a needle biopsy which showed a non-small cell cancer.  Given the location and all her other medical problems, I think that she will be best be treated with SBRT, and I will arrange for her to see one of our radiation oncologist, Dr. Kathrynn Running or his associates to consider whether to do SBRT.  I discussed this also with Dr. Arline Asp.  Her blood pressure is 130/68, pulse 100, respirations 20 and sats were 98%.  [name], M.D. Electronically Signed  DPB/MEDQ  D:  09/26/2010  T:  09/27/2010  Job:  [id number]  cc:   Samul Dada, M.D.

## 2010-10-02 ENCOUNTER — Ambulatory Visit: Payer: Medicare Other | Attending: Radiation Oncology | Admitting: Radiation Oncology

## 2010-10-02 ENCOUNTER — Other Ambulatory Visit: Payer: Self-pay | Admitting: Radiation Oncology

## 2010-10-02 DIAGNOSIS — Z510 Encounter for antineoplastic radiation therapy: Secondary | ICD-10-CM | POA: Insufficient documentation

## 2010-10-02 DIAGNOSIS — Z87891 Personal history of nicotine dependence: Secondary | ICD-10-CM | POA: Insufficient documentation

## 2010-10-02 DIAGNOSIS — Z800 Family history of malignant neoplasm of digestive organs: Secondary | ICD-10-CM | POA: Insufficient documentation

## 2010-10-02 DIAGNOSIS — Z801 Family history of malignant neoplasm of trachea, bronchus and lung: Secondary | ICD-10-CM | POA: Insufficient documentation

## 2010-10-02 DIAGNOSIS — Z8049 Family history of malignant neoplasm of other genital organs: Secondary | ICD-10-CM | POA: Insufficient documentation

## 2010-10-02 DIAGNOSIS — Z7901 Long term (current) use of anticoagulants: Secondary | ICD-10-CM | POA: Insufficient documentation

## 2010-10-02 DIAGNOSIS — C3490 Malignant neoplasm of unspecified part of unspecified bronchus or lung: Secondary | ICD-10-CM

## 2010-10-02 DIAGNOSIS — Z803 Family history of malignant neoplasm of breast: Secondary | ICD-10-CM | POA: Insufficient documentation

## 2010-10-02 DIAGNOSIS — Z79899 Other long term (current) drug therapy: Secondary | ICD-10-CM | POA: Insufficient documentation

## 2010-10-02 DIAGNOSIS — C3410 Malignant neoplasm of upper lobe, unspecified bronchus or lung: Secondary | ICD-10-CM | POA: Insufficient documentation

## 2010-10-02 DIAGNOSIS — I739 Peripheral vascular disease, unspecified: Secondary | ICD-10-CM | POA: Insufficient documentation

## 2010-10-02 DIAGNOSIS — Z86711 Personal history of pulmonary embolism: Secondary | ICD-10-CM | POA: Insufficient documentation

## 2010-10-02 DIAGNOSIS — Z9861 Coronary angioplasty status: Secondary | ICD-10-CM | POA: Insufficient documentation

## 2010-10-04 ENCOUNTER — Ambulatory Visit (HOSPITAL_COMMUNITY)
Admission: RE | Admit: 2010-10-04 | Discharge: 2010-10-04 | Disposition: A | Discharge status: Home / Self Care | Payer: Medicare Other | Source: Ambulatory Visit | Attending: Radiation Oncology | Admitting: Radiation Oncology

## 2010-10-04 DIAGNOSIS — J3489 Other specified disorders of nose and nasal sinuses: Secondary | ICD-10-CM | POA: Insufficient documentation

## 2010-10-04 DIAGNOSIS — C3490 Malignant neoplasm of unspecified part of unspecified bronchus or lung: Secondary | ICD-10-CM | POA: Insufficient documentation

## 2010-10-04 MED ORDER — GADOBENATE DIMEGLUMINE 529 MG/ML IV SOLN
10.0000 mL | Freq: Once | INTRAVENOUS | Status: AC | PRN
  Administered 2010-10-04: 10 mL via INTRAVENOUS

## 2010-10-24 NOTE — Discharge Summary (Signed)
[name], KOTLARZ                ACCOUNT NO.:  [id number]   MEDICAL RECORD NO.:  [id number]          PATIENT TYPE:  INP   LOCATION:  5001                         FACILITY:  [facility]   PHYSICIAN:  Kerrin Champagne, M.D.   DATE OF BIRTH:  04-16-1931   DATE OF ADMISSION:  11/26/2006  DATE OF DISCHARGE:  12/02/2006                               DISCHARGE SUMMARY   ADMISSION DIAGNOSES:  1. Recurrent central spinal stenosis at L4-5 with degenerative      spondylolisthesis, L4-5, grade 1.  2. Diabetes mellitus type 2, non-insulin-dependent.  3. Hypertension.  4. Hypercholesterolemia.  5. History of pulmonary embolism.  On chronic Coumadin.  6. Chronic obstructive pulmonary disease.  7. History of lung cancer.  8. Irritable bowel syndrome.   DISCHARGE DIAGNOSES:  1. Recurrent central spinal stenosis at L4-5 with degenerative      spondylolisthesis, L4-5, grade 1.  2. Posthemorrhagic anemia requiring blood transfusion.  3. Hypokalemia, resolved with supplementation.  4. Postoperative ileus, resolved at discharge.  5. Diabetes mellitus type 2 diabetes, non-insulin dependent.  6. Hypertension.  7. Hypercholesterolemia.  8. History of pulmonary embolism, on chronic Coumadin.  9. Chronic obstructive pulmonary disease.  10.History of lung cancer.  11.Irritable bowel syndrome.   PROCEDURE:  On November 26, 2006, the patient underwent redo central  laminectomy, L4-5, with posterolateral fusion, L4-5, using a combination  of local bone graft and VITOSS, pedicle screws and rods.  This was  performed by Dr. Otelia Sergeant, assisted by Maud Deed, P.A.-C. under general  anesthesia.   CONSULTATIONS:  None.   BRIEF HISTORY:  The patient is a 77 year old female status post previous  L3-4 central decompression.  She did well following the surgery but had  recurring complaints of pain and discomfort with radiation into the  lower extremities and recurring neurogenic claudication.  Radiographs  demonstrated  a degenerative spondylolisthesis at the L4-5 level.  MRI  scan showed recurrent lumbar spinal stenosis with scar tissue over the  posterior aspect of the laminectomy site with central narrowing  secondary to hypertrophic changes involving the facets at L4-5.  It was  felt that she would require surgical intervention and was admitted for  the procedure as stated above.   BRIEF HOSPITAL COURSE:  The patient tolerated the procedure under  general anesthesia without complications.  Postoperatively, she was  placed on ileus precautions.  However, with a history of chronic  constipation, she did develop a mild postoperative ileus.  This was  treated with Reglan, diet restrictions, IV fluids.  She also had  hypokalemia which was treated with oral supplementation.  Eventually,  with the use of stool softeners, laxatives and suppositories, she was  able to have a bowel movement and her ileus resolved.  The patient  continued to have some mildly elevated temperatures with slight  hypoxemia.  Chest x-ray showed atelectasis.  Incentive spirometry and  handheld nebulizers were utilized.  Chest x-ray without any effusions or  signs of pneumonia.  Cardiomegaly and congestion noted.  Pain control  initially with IV analgesics via PCA.  She was gradually weaned to p.o.  analgesics.  She did require OxyContin 20 mg b.i.d. with Percocet for  breakthrough pain.  Muscle relaxers were also utilized.  The patient  continued to have low hemoglobin and hematocrit, drifting slowly to the  lowest value of 7.8.  She received 2 units of autogenous blood and  returned to a value of 11.3 on hemoglobin, 33.9 hematocrit prior to  discharge.  The patient's Coumadin was restarted postoperatively for her  history of pulmonary embolism.  Adjustments in dosage were made  according to pro times by the pharmacy at [facility].  She was also  on Lovenox until she was therapeutic with her INR.  The patient received  physical  therapy for ambulation and gait training.  She utilized an  [employer] when out of bed and ambulatory.  She was slow with progression  initially with ambulation but gradually, as pain was better controlled,  she was able to increase her activity level.  She received occupational  therapy for ADLs.  Prior to discharge, she was ambulating as much as 100  feet and was able to go up and down several stairs.  She utilized a  walker for ambulation.  A rehabilitation consult was obtained; however,  due to her excellent progress, she was not felt to be a candidate for  inpatient rehabilitation but it was recommended she have home health  physical therapy and occupational therapy.  Dressing changes were done  daily and the wound was found to be healing well.  Dressing with  Tegaderm was applied, and the patient was allowed to shower.   PERTINENT LABORATORY VALUES:  Hemoglobin and hematocrit as stated above.  Urine culture no growth on November 29, 2006.  Chemistry studies with sodium  as low as 133; however, normal at discharge.  Potassium 3.3 and 4.0 at  discharge.  Glucose values ranging from 112 to 176.  INR at discharge  1.6.   PLAN:  The patient was discharged to her home with arrangements for home  health physical therapy, occupational therapy and durable medical  equipment.  She will follow up with Dr. Otelia Sergeant 2 weeks from the date of  surgery.  The patient was instructed to change her dressing as needed  after bathing.  She will wear a Tegaderm type dressing on the wound and  will be allowed to shower.  She will ambulate with a walker and wear her  brace anytime out of bed.  No lifting, bending, twisting and no driving.   MEDICATIONS:  1. Coumadin per pharmacy recommendation.  2. Percocet 5/325 one to two every 4-6 hours as needed for      breakthrough pain.  3. OxyContin 20 mg one q.12h.  4. Robaxin 500 mg one every 8 hours as needed for spasm.  5. She will resume other home medications as  taken prior to admission,      and medicine reconciliation form was given to her with      instructions.   All questions encouraged and answered prior to discharge.   CONDITION ON DISCHARGE:  Stable.      Wende Neighbors, P.A.      Kerrin Champagne, M.D.  Electronically Signed    SMV/MEDQ  D:  01/16/2007  T:  01/16/2007  Job:  [id number]

## 2010-10-24 NOTE — Letter (Signed)
March 02, 2009   Samul Dada, MD  [address] [facility]  [place], [place] [address]   Re:  [name], [name]                DOB:  07/09/1930   Dear Roe Coombs.   I saw the patient for followup today.  Her chest x-ray showed no  evidence of recurrence of her cancer.  She is doing well overall except  for chronic back pain.  Her blood pressure was 130/68, pulse 79,  respirations 18, sats were 95%.  I will plan to see her back again in 6  months with a CT scan which at that time will be approximately 4 years  since her previous surgery.   [name], M.D.  Electronically Signed   DPB/MEDQ  D:  03/02/2009  T:  03/03/2009  Job:  [id number]

## 2010-10-24 NOTE — Discharge Summary (Signed)
[name], [name]                ACCOUNT NO.:  [id number]   MEDICAL RECORD NO.:  [id number]          PATIENT TYPE:  INP   LOCATION:  3022                         FACILITY:  [facility]   PHYSICIAN:  Kerrin Champagne, M.D.   DATE OF BIRTH:  [birth year]   DATE OF ADMISSION:  10/07/2007  DATE OF DISCHARGE:  10/13/2007                               DISCHARGE SUMMARY   ADMISSION DIAGNOSES:  1. 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  2. Degenerative disk disease at L4-S1.  3. Type 2 diabetes mellitus, diet controlled.  4. Hypertension.  5. Hypercholesterolemia.  6. Status post renal artery stent.  7. History of pulmonary embolism.  8. Chronic obstructive pulmonary disease.  9. History of lung cancer status post partial right lower lobectomy.  10.Irritable bowel syndrome.  11.Arthritis.  12.Gastroesophageal reflux disease.   DISCHARGE DIAGNOSES:  1. 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  2. Degenerative disk disease at L4-S1.  3. Type 2 diabetes mellitus, diet controlled.  4. Hypertension.  5. Hypercholesterolemia.  6. Status post renal artery stent.  7. History of pulmonary embolism.  8. Chronic obstructive pulmonary disease.  9. History of lung cancer status post partial right lower lobectomy.  10.Irritable bowel syndrome.  11.Arthritis.  12.Gastroesophageal reflux disease.  13.Posthemorrhagic anemia requiring blood transfusion.   PROCEDURES:  On October 07, 2007 the patient underwent,  1. Re-arthrodesis posterolateral fusion at L4-L5 with extension of      posterolateral fusion at the L5-S1 level with revision of hardware      at L4-L5 and primary instrumentation at the S1 pedicle with screws      and rods.  2. Right iliac crest bone graft harvest site through separate fascial      incision.  3. Local bone graft with OPL-1, aspiration of bone marrow from right      iliac crest with Vitoss.  This was performed by Dr. Otelia Sergeant  and      assisted by Maud Deed, [facility] under general anesthesia.   CONSULTATIONS:  None.   BRIEF HISTORY:  The patient is a 77 year old female with previous  decompressive laminectomy at L4 for spinal fusion.  She developed  spondylolisthesis and required return to the operating room in June 2008  for re-decompression at the L4-L5 level with arthrodesis using  posterolateral fusion, pedicle screws and rods instrumentation.  Several  months postoperatively she did well with the pain resolved.  However,  she developed increasing pain to the point of requiring narcotic  analgesics on a continuous basis.  Myelogram and post-myelogram CT scan  demonstrated the patient having motion at the L4-L5 level with  translation from 2 mm of slip in a supine position to over 11 mm of slip  when bending or standing.  Spondylolisthesis reduced when she was in a  supine position.  It was felt that she would require re-arthrodesis at  this level considering use of transforaminal lumbar interbody fusion for  stabilizing disk space at the L4-L5 level.  She also was noted to have  severe degenerative disk disease at the L5-S1  level and it was felt she  would benefit from translaminar interbody fusion at this area extended  from the previous from the above level.  She was admitted for the  procedure as stated above.   BRIEF HOSPITAL COURSE:  The patient tolerated the procedure under  general anesthesia without complications.  She received 250 mL of Cell  Saver blood and 2 units of autogenous blood intraoperatively.  Postoperatively, neurovascular function of the lower extremities was  noted to be intact.  She was held on a liquid diet until bowel function  returned and at that time advanced to a low-carbohydrate diet.  She was  able to tolerate this diet without difficulty.  Hemovac drain was  discontinued on the first postoperative day and wound was found to be  healing without erythema or drainage.  Daily  dressing changes were  provided for her during the hospital stay.  The patient had sore throat  which was treated with Chloraseptic throat spray.  She received PCA  analgesics initially and was weaned to p.o. analgesics without  difficulty.  Foley catheter was discontinued and she was able to void  without difficulty.  The patient had further acute blood loss anemia  requiring blood transfusion and received two further units of packed red  blood cells.  Physical therapy assisted the patient with ambulation and  gait training.  The patient utilized an Therapist, nutritional for ambulatory  purposes.  She utilized a walker for ambulation as well.  Occupational  therapy assisted with ADLs.  Prior to discharge, the patient was  ambulating as much as 150 feet in the hallway.  On Oct 13, 2007, the  patient was stable for discharge to her home.  Arrangements were made  for home health physical therapy and occupational therapy as well as  necessary durable medical equipment.  The patient was resumed on her  Coumadin postoperatively for DVT and PE prophylaxis.  She was eventually  placed back on her usual dosage.   PERTINENT LABORATORY VALUES:  On admission, CBC with hemoglobin and  hematocrit 11.7 and 35.4.  The patient's hemoglobin's lowest value at  8.0 with hematocrit 23.1 and at discharge hemoglobin and hematocrit were  9.4 and 27.8.  INR at discharge 1.2 with Protime 15.5.  Chemistry  studies on admission within normal limits with exception of glucose 104.  Potassium at lowest value of 3.4 and returned to 3.5 on the next day  laps.  On October 09, 2007, BUN 5, creatinine 0.74, GFR greater than 60.  Calcium 7.4.  Urinalysis on admission negative for urinary tract  infection.  Repeat urinalysis on October 09, 2007 with 0-2 WBCs, 11-20  RBCs, and small amount of hemoglobin.  EKG on admission, normal sinus  rhythm with short PR, otherwise normal EKG.  No significant change since  last tracing confirmed by Dr.  Donnie Aho.   PLAN:  The patient was discharged to her home.  Home health physical  therapy and occupational therapy will evaluate her at home.  This is  done through [facility]  She may shower and change her  dressing daily for her incision.  No bending, lifting, or twisting.  Low-  carbohydrate diet.  She will resume home medications as taken prior to  admission.  Medication reconciliation was provided with these  instructions.   DISCHARGE MEDICATIONS:  1. OxyContin SR 20 mg one p.o. q.12 h.  2. Percocet 5/325 one to two every 4-6 hours as needed for  breakthrough pain.  3. Robaxin 500 mg one every 8 hours as needed for spasm.   The patient has home oxygen which she will utilize.  Should she have any  respiratory difficulties while utilizing the brace or concerns, she is  advised to call her primary care physician, otherwise, she will notify  Dr. Barbaraann Faster office with other questions or concerns.  Office visit in 2  weeks.   CONDITION ON DISCHARGE:  Stable.      Wende Neighbors, P.A.      Kerrin Champagne, M.D.  Electronically Signed    SMV/MEDQ  D:  11/27/2007  T:  11/27/2007  Job:  [id number]

## 2010-10-24 NOTE — Letter (Signed)
August 31, 2009   Samul Dada, MD  [address] [facility]  [place], [place] [address]   Re:  [name], [name]                DOB:  1930/12/02   Dear Roe Coombs,   I saw the patient back today.  She is now over 4 years since her surgery  and no evidence of any recurrence of her cancer.  She is doing well  overall, and I will see her back again in 1 year with another CT scan.  Her blood pressure is 144/75, pulse 66, respirations 18, sats were 92%.   [name], M.D.  Electronically Signed   DPB/MEDQ  D:  08/31/2009  T:  09/01/2009  Job:  [id number]   cc:   Nadine Counts

## 2010-10-24 NOTE — Letter (Signed)
August 05, 2007   Samul Dada, M.D.  [address] [facility]  [place], [place] [address]   Re:  MAYANNA, GARLITZ                DOB:  1930/08/03   Dear Roe Coombs:   I saw Diona Browner about 2 years after her lumpectomy.  Her CT scan  today showed no evidence of recurrence.  She is doing well overall.  Her  blood pressure was 122/71.  Pulse 80.  Respirations 18.  Saturations  were 92%.  I will see her back again in 6 months with a chest x-ray.  I  appreciate the opportunity for seeing Ms. Filla.   [name], M.D.  Electronically Signed   DPB/MEDQ  D:  08/05/2007  T:  08/06/2007  Job:  [id number]

## 2010-10-24 NOTE — Op Note (Signed)
[name], [name]                ACCOUNT NO.:  [id number]   MEDICAL RECORD NO.:  [id number]          PATIENT TYPE:  INP   LOCATION:  3022                         FACILITY:  [facility]   PHYSICIAN:  Kerrin Champagne, M.D.   DATE OF BIRTH:  August 12, 1930   DATE OF PROCEDURE:  10/07/2007  DATE OF DISCHARGE:                               OPERATIVE REPORT   PREOPERATIVE DIAGNOSES:  1. 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  2. Degenerative disk disease at L4-S1.   POSTOPERATIVE DIAGNOSES:  1. 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  2. Degenerative disk disease at L4-S1.   PROCEDURE:  1. Re-arthrodesis posterolateral fusion at L4-L5 with extension of      posterolateral fusion at L5-S1 level with revision of hardware at      L4-L5 with primary instrumentation at the S1 pedicle screws and      rods.  2. Right iliac crest bone graft harvest site through separate fascial      incision.  3. Local bone graft and OPL-1 aspiration of bone marrow from right      iliac crest with VITOSS.   SURGEON:  Kerrin Champagne, MD   ASSISTANT:  Wende Neighbors, PA-C   ANESTHESIA:  General via orotracheal intubation by Dr. Gypsy Balsam,  supplemental local infiltration Marcaine 0.5% 1:200,000 epinephrine 10  mL.   ESTIMATED BLOOD LOSS:  550 mL.   BLOOD RETURNED:  250 mL of Cell Saver blood with high hematocrit, 2  units of autogenous blood.   COMPLICATIONS:  None.   The patient returned to the PACU in good condition.  Intraoperative  neuromonitoring noting the pedicle screw soft tissue resistance greater  than 29-0 screws.  Range of 29-70.   HISTORY OF PRESENT ILLNESS:  The patient is a 77 year old female who has  undergone previous decompressive laminectomy at L4 for spinal stenosis  went on to develop a spondylolisthesis returned to the operating room in  June 2008 and then underwent a re-decompression at the L4-L5 level with  arthrodesis using  posterolateral fusion, pedicle screws, and rods.  Postoperatively, she did well for several months and developed  increasing discomfort and pain this year to the point where she is  requiring narcotic medications on a continuous basis.  Underwent a  preoperative myelogram, and postmyelogram CT scan did demonstrates the  patient has motion at the L4-L5 level with translation from 2 mm of slip  in a supine position to over 11 mm of slip when bending or standing up.  This spondylolisthesis reduces when she is in a supine position.  After  discussion, it was determined to go ahead and proceed with an attempt at  re-arthrodesis at this level considering the use of a transforaminal  lumbar interbody fusion stabilized disk space at L4-L5.  The patient had  severe degenerative disk changes at L5-S1, and a L5-S1 with TLIF and  extension of fusion was recommended.  The patient was brought to the  operating room to undergo a re-arthrodesis at the L4-L5 level with  possible TLIF and TLIF at L5-S1  with extension of fusion to the L5-S1  level.   INTRAOPERATIVE FINDINGS:  The patient was found to have poor quality of  bone and all of her screws at the L5 and L4 levels were loose.  They  were easily removed.  Bone graft did not incorporate at either  posterolateral fusion site.   Attempts at removing facets on the right side of the L4-L5 and L5-S1  demonstrated that there was severe foraminal narrowing present and this  made it impossible to perform transforaminal lumbar interbody fusion  safely.  There was scar tissue present within the posterior aspect of  the spinal canal extending up into neuroforamen on the right side at  each segment which precluded the ability to mobilize either L4 or L5  nerve root and that prevented TLIF from being performed.  Therefore, we  proceeded with replacement of screws above the L4 and L5 level,  decortication of the transverse process at L4 and L5, and sacroiliac   bilaterally.  Application of pedicle screws at the S1 level and then  redoing rods and screws extending from L4 to sacrum bilaterally with  posterolateral fusion using local iliac crest bone graft harvested  through a separate incision at the L1 in addition to VITOSS using  extender with bone marrow aspiration right iliac crest.  With this  obtained, excellent purchase over 5 out of 6 screws, the screw on the  right side at the L4 level was plus/minus.   DESCRIPTION OF PROCEDURE:  After adequate general anesthesia, the  patient placed on a Jackson spinal table.  She was large patient, obese.  All pressure points were well padded with PAS stockings for both lower  extremities.  She had her preoperative antibiotics with 2 g of Ancef.  Intraoperative neuromonitoring leads and Foley catheter placed.  Prior  to surgery, the patient had marking of the right lower lumbar region  determining side of TLIF and levels for surgery.  Time-out was performed  during this surgical procedure in order to determine personnel present  at the primary closure of the surgical procedure and the surgical  procedure planned.  Expected blood loss was substantial, so that the  patient had 2 units of autogenous blood available and Cell Saver was  being used.  She had an arterial line in place and venous lines.  The  patient underwent standard prep with DuraPrep solution, was draped in  the usual manner.  Iodine Vi-Drape was used.  The incision ellipsed in  the older incision scar in the midline extending from approximately L2  to S2 through the skin and subcutaneous layers controlling bleeders  using electrocautery.  The lumbodorsal fascia incised over the spinous  processes of L2 and L3 bilaterally and continued in the midline down to  the S1 level and S2 level.  This was then carried down to the base of  spinous process at L3 bilaterally as well as L2 and then at the S1 level  bilaterally.  Cobb used to elevate  paralumbar muscles bilaterally  lateral to the lateral masses and the posterior instrumentation at L4  and L5.  Bleeders controlled using electrocautery.  The sacroiliac  exposed bilaterally as well as the L5-S1 facets bilaterally.  The soft  tissure overlying the patient's previous pedicle screws and rods was  carefully debrided using electrocautery and curettes as well as  rongeurs.  A viper retractor was then placed in the depth of the  incision.  Screw caps were then removed, overall  4 screws and the rod  carefully debrided of any soft tissue attachments after the caps were  removed and the rods were removed bilaterally.  Soft tissue was removed  from within the cap and the fastener over the screws at each level and  then screws were removed using appropriate hex screwdriver.  After this  was done, then each of the posterolateral regions and insertion points  carefully debrided down to bone and the transverse process at both L4  bilaterally and L5 bilaterally were identified and exposed using  curettes with high-speed burr.  The sacroiliac bilaterally also was  exposed.  On the right side, the pedicle screws have been removed, a  6.25 screw x 45 mm at the L4 level and a 7.0 screw x 45 mm at the L5  level.  Similar screw length and size on the right side.  These were all  marked and information regarding the size kept throughout the case.  At  the L4 level then a size 7-mm x 40-mm screw was inserted on the right  and the left side and then on the left side at the L5 level, a 7.75  screw was placed at 45 mm, both of these provided excellent purchase.  Sacral ala was decorticated on the left side.  The left facet was  carefully excised and decorticated in order to allow full fusion to  occur here.  The patient's transverse process at L5 nearly abutted the  ala superiorly on both sides, right and left sides.  C-arm fluoro was  brought into the field and under C-arm fluoro, then an awl was  used to  make an entry point into the midportion of the lateral aspect of the  superior articular process at the S1 below the superior aspect of the  sacral ala.  Intraoperative fluoro demonstrated this to be in line with  the pedicle at S1 well positioned parallel to the endplate of S1.  A  handheld pedicle finder straight was then used to probe the pedicle at  S1 on the left side.  Ball-tip probe used to further assess the pedicle  and ensure patency and no signs of bulging cortex.  Note that the length  of screw measured at 40-mm x 7.0 screw was chosen.  No tapping was  performed.  The screw was then entered into the hole placed by the  pedicle finder and the screwed in good position alignment after  obtaining an excellent purchase on the sacral pedicle.  Decortication  was carried down over the sacral ala on the left side.  Attention then  turned to the right side where the pedicle screws at the L4 and L5 were  replaced.  Pedicle screw at L4 replaced with pedicle screw 7.0 mm x 45  mm and at the L5 level 7.75 x 35 mm as the previous screw was felt to  approach cortex on the lateral aspect of vertebral body of L5.  Both  screws obtained purchased, one at the L4 level, not as solid as than on  the left side.  Finally, the S1 screw was placed on the left side, first  an awl used to make an entry point at the expected level of the pedicle  observed on C-arm fluoro to be slightly high in entry points, so this  was replaced about 2-3 mm lower than the first entry point.  Then, a  straight pedicle finder used to probe pedicle on the right side of the  S1.  Once this was completed, then depth was measured at 30 mm on this  side a 7.0 x 30-mm screw was chosen.  A ball-tip probe was used to probe  the hole placed by the pedicle probe indicating patency of the pedicle  opening as well as no sign of bulging cortex.  The screw was then  screwed on the right side to 30 mm obtaining an excellent  purchase.  Attempts were made then to perform facetectomies on the right side at L5-  S1.  This, however, indicated that the interpedicular distance on the  right side as well as the transverse process proximally in the superior  portion of the pedicle for the lamina on the right side precluded the  ability to perform TLIF on the right side at L5-S1 and similarly at L4-  L5.  There was no room possible in addition scar tissue present within  the thecal sac and inability to mobilize both the L4 and L5 nerve roots.  With this, then decision was chosen to perform a re-arthrodesis  posterolateral alone.  The transverse processes have been well  delineated and decorticated using the patient's high-speed bur.  A bone  graft was then harvested from the right iliac crest through a separate  incision subcutaneous to the patient's lumbodorsal fascia to the right  posterior superior iliac spine.  Incision made on the fascia and thereof  and then elevation over the lateral inferior aspect of the posterior  superior iliac crest a Taylor retractor was placed.  The patient then  had bone graft harvested using a three-quarter inch curved osteotome  resecting the cortical cancellous bone from the posterior aspect of the  posterosuperior iliac spine and then excising cancellous bone using  gouges.  Additional bone marrow was aspirated from the right iliac crest  through the same opening using the aspiration equipment provided with  VITOSS material.  A 10 mL of bone marrow aspirate was obtained and this  was applied to 10 mL of VITOSS and this has got strips of mastic  quality.  Adequate bone graft had been obtained from both local and  iliac crests as well as local bone graft from the wound itself and use  of the VITOSS, OPL-1 was added to the mixture of the local bone graft  and iliac crest bone graft to try and provide stimulation or osteo  stimulation.  Osteo inductive retention was felt to be improved  with  this using the VITOSS to provide some amount of bone extender.  With  this, then a 65-mm length precurved rods were then carefully  encountered, placed within the fasteners, and the screws extending from  L4 to sacrum after performing intraoperative neuro testing over the soft  tissue resistance to both sides.  The soft tissue resistance got  measured greater than 29 throughout, with the ranges between 29-70.  Note that the rods were then placed within the fasteners and the caps  then placed easily at each level.  The caps were then each tightened to  80 foot pounds using the torque device provided.  Bone grafts were then  placed posterolaterally extending from the transverse process at L4-L5  to the sacral ala on the left side and then on the right side similarly.  When this was completed, then irrigation was performed.  Permanent  images obtained in both the AP and lateral planes under C-arm for  permanent documentation purposes.  Following irrigation, then the right  iliac crest bone graft harvest  site was closed using Gelfoam for  hemostasis and approximating the fascial layers over the iliac crest  with interrupted #1 Vicryl suture.  The subcu disk space extending to  the posterior right iliac crest was closed with several subcu sutures of  2-0 Vicryl.  The lumbodorsal fascia was then approximated with spinous  processes and their spinous ligaments after first placing a medium  Hemovac drain in the depth of the incision extending over the right  lower lumbar spine.  Lumbodorsal fascia was approximated in the spinous  process and spinous ligaments with interrupted #1 Vicryl sutures in a  figure-of-eight fashion.  Paralumbar muscles approximated over the open  laminotomy site L4 and L5 using interrupted #1 Vicryl sutures.  Lumbodorsal fascia was reapproximated to the patient's S1 and S2 level  with #1 Vicryl sutures.  The deep fascial layers approximated with  interrupted #1 0  Vicryl sutures and more superficial layers with  interrupted 2-0 Vicryl sutures and the skin closed with a running subcu  stitch of 4-0 Vicryl.  Dermabond was then applied, 4 x 4,  ABD pads affixed to the skin with hyperfix tape.  The patient then was  returned to the supine position, reactively extubated, and returned to the recovery room in satisfactory condition.  All instruments and sponge  counts were correct.      Kerrin Champagne, M.D.  Electronically Signed     JEN/MEDQ  D:  10/07/2007  T:  10/08/2007  Job:  [id number]

## 2010-10-24 NOTE — Op Note (Signed)
[name], SCHMUTZ                ACCOUNT NO.:  [id number]   MEDICAL RECORD NO.:  [id number]          PATIENT TYPE:  AMB   LOCATION:  DSC                          FACILITY:  [facility]   PHYSICIAN:  Lowell Bouton, M.D.DATE OF BIRTH:  02/17/1931   DATE OF PROCEDURE:  07/14/2008  DATE OF DISCHARGE:                               OPERATIVE REPORT   PREOPERATIVE DIAGNOSIS:  Right carpal tunnel syndrome.   POSTOPERATIVE DIAGNOSIS:  Right carpal tunnel syndrome.   PROCEDURE:  Decompression median nerve, right carpal tunnel.   SURGEON:  Lowell Bouton, MD   ANESTHESIA:  Marcaine 0.5% local with sedation.   OPERATIVE FINDINGS:  The patient had no masses in the carpal canal.  The  motor branch of the nerve was intact.   PROCEDURE:  Under 0.5% Marcaine and local anesthesia with a tourniquet  on the right arm, she was prepped and draped in usual fashion.  After  explaining the limb, the tourniquet was inflated to 250 mmHg.  A 3-cm  longitudinal incision was made in the palm just ulnar to the thenar  crease and carried down through the subcutaneous tissues.  Blunt  dissection was carried through the superficial palmar fascia distal to  the transverse carpal ligament.  A hemostat was then placed in the  carpal canal hook of the hamate and the transverse carpal ligament was  divided on the ulnar border of the median nerve.  The proximal end of  the ligament was divided with the scissors after dissecting the nerve  away from the undersurface of the ligament.  The carpal canal was then  palpated and was found to be adequately decompressed.  The nerve was  examined and the motor branch was identified.  The wound was irrigated  with saline.  The skin was closed with 4-0 nylon sutures.  Sterile  dressings were applied followed by a volar wrist splint.  The patient  tolerated the procedure well, went to recovery room awake in stable and  good condition.      Lowell Bouton, M.D.  Electronically Signed     EMM/MEDQ  D:  07/14/2008  T:  07/15/2008  Job:  [id number]   cc:   Nadine Counts

## 2010-10-24 NOTE — Letter (Signed)
January 21, 2007   Dr. Roe Coombs [place]  [address] [place]  [address]   Re:  DAVINE, SWENEY                DOB:  [birth year]   Dear Roe Coombs:   I saw Ms. Eisman for followup today. Her chest x-ray was stable with no  evidence of recurrence of her surgery. She had a previous CT scan  approximately 2 months ago by Dr. Otelia Sergeant that was apparently negative.  She is in a back brace from this previous surgery. Overall though she  appears to be doing well. She is now over 16 months since her resection.  Her blood pressure was 122/71, pulse 84, respirations 20, saturations  were 92%. I plan to see her back again in 6 months and will get a CT  scan at that time.   [name], M.D.  Electronically Signed   DPB/MEDQ  D:  01/21/2007  T:  01/23/2007  Job:  [id number]   cc:   Kerrin Champagne, M.D.

## 2010-10-24 NOTE — Letter (Signed)
August 25, 2008   Samul Dada, MD  [address] [place] [facility]  [place], [place] [address]   Re:  [name], [name]                DOB:  09/23/1930   Dear Roe Coombs,   I saw the patient back today.  As you know, she got a CT scan about a  month ago by you that was essentially negative except for questionable  ground-glass area in the right upper lobe, which was thought to be  questionable new.  Apparently, she has a followup CT scan scheduled in  about 2 months.  I looked at the x-rays and was not too concerned about  that but obviously since there has been a slight change, she does need  to have a followup.  I will see her again in 6 months with a chest x-  ray, but I will be happy to see her back again should there be some  change in the right upper lobe, questionable right upper lobe nodule.  Her blood pressure is 147/83, pulse 75, respirations 18, and sats are  96%.   [name], M.D.  Electronically Signed   DPB/MEDQ  D:  08/25/2008  T:  08/25/2008  Job:  [id number]

## 2010-10-24 NOTE — Assessment & Plan Note (Signed)
OFFICE VISIT   [name], [name]  DOB:  Jul 25, 1930                                        January 28, 2008  CHART #:  [id number]   She returns 3-1/2 years since her surgery.  Her lungs were clear to  auscultation and percussion.  Her blood pressure was 127/72, pulse 92,  respirations 18, and sats were 94%.  She will see Dr. Arline Asp next  month and I will see her back in 6 months with a CT scan which will be  approximately 4years.   [name], M.D.  Electronically Signed   DPB/MEDQ  D:  01/28/2008  T:  01/29/2008  Job:  [id number]

## 2010-10-24 NOTE — Op Note (Signed)
NAME:  [name], [name]             ACCOUNT NO.:  [id number]   MEDICAL RECORD NO.:  [id number]          PATIENT TYPE:  INP   LOCATION:  5001                         FACILITY:  [facility]   PHYSICIAN:  Kerrin Champagne, M.D.   DATE OF BIRTH:  16-Aug-1930   DATE OF PROCEDURE:  11/26/2006  DATE OF DISCHARGE:                               OPERATIVE REPORT   PREOPERATIVE DIAGNOSIS:  Recurrent central spinal stenosis, lumbar L4-  L5, degenerative spondylolisthesis, L4-L5, grade I.   POSTOPERATIVE DIAGNOSIS:  Recurrent central spinal stenosis, lumbar L4-  L5, degenerative spondylolisthesis, L4-L5, grade I.   PROCEDURE PERFORMED:  Re-do central laminectomy, L4-L5, with  posterolateral fusion, L4-L5, using a combination of local bone graft  and VITOSS.   INSTRUMENTATION:  L4-L5 using pedicle screws and rods.  Bone marrow  aspirate performed left L4 for the use of VITOSS.  Intraoperative neuro  monitoring.   SURGEON:  Kerrin Champagne, M.D.   ASSISTANT SURGEON:  Wende Neighbors, P.A.   ANESTHESIA:  General via orotracheal intubation, supplemented with local  infiltration with Marcaine 0.5% and 1:200,000 epinephrine 15 mL.   SPECIMENS:  None.   ESTIMATED BLOOD LOSS:  500 mL.   COMPLICATIONS:  None.   FLUID REPLACEMENT:  The patient had Cell Saver blood returned, 200 mL.   DRAINS:  A Hemovac x one to the lumbar spine, right side; and a Foley to  straight drain.   BRIEF CLINICAL HISTORY:  The patient is a 76 year old female who has  undergone a previous lumbar decompressive procedure.  That surgery  performed in 2006.  The patient underwent decompressive laminectomy for  lumbar spinal stenosis at the L3-L4 level.  Following surgery, the  patient did well for a short period of time, then had recurring  complaints of pain and discomfort, with radiation to the both legs and  recurring neurogenic claudication.  Follow-up radiographs demonstrated a  degenerative spondylolisthesis at the L4-L5  level.  Follow-up MRI scan  demonstrated recurrent lumbar spinal stenosis with scar tissue over the  posterior aspect of the laminectomy site with central narrowing  secondary to hypertrophic changes involving the facets at L4-L5.   The patient is brought to the operating room to undergo decompressive  laminectomy, L4-L5, with instrumentation and fusion at the L4-L5 level  posterolaterally.  In the interval, she had been diagnosed as having a  lung nodule.  She also post surgery two years ago has shortness of  breath.  CT scan demonstrated pulmonary embolism.  She also was found to  have a mass and was treated for lung carcinoma.  She is currently in  remission, had undergone a resection of the disease in her right lower  lobe.  Preoperative study demonstrated a (?)nodule.  Preoperative CT  scan was negative for any recurrent mass.  She was brought to the  operating room to undergo decompression and fusion at the L4-L5 level.   INTRAOPERATIVE FINDINGS:  The patient was found to have severe lumbar  spinal stenosis at L4-L5 secondary to hypertrophic changes involving the  facets at the L4-L5 level pressing upon the thecal sac  and both L4 nerve  roots and both L5 nerve roots.   DESCRIPTION OF PROCEDURE:  After adequate general anesthesia, the  patient was transferred to the [facility] spine table with posts in place  carefully positioned over the iliac crest, chest and face.  And those  for the thighs.  She had a Foley catheter placed prior to turning.  Standard prep with DuraPrep solution in a line extending from the mid  dorsal spine to the mid sacrum.  Draped in the usual manner.  An iodine  Vi-Drape was used.  The old incision scar was infiltrated with Marcaine  0.5% with 1:200,000 epinephrine and ellipsed using a #10 blade scalpel.  The total incision length was approximately 5 to 5-1/2 inches in length  through the subcutaneous layers carried down to the lumbodorsal fascia.  The  incision was then carried down to the spinous process of L3, L2  bilaterally and the paralumbar muscles carefully incised off the  posterior aspect of the spinous process and lamina to L2 and L3, and the  posterior aspects of the facets at L4-L5 and L5 and S1 were carefully  exposed.   A Viper retractor was placed.  Bleeding was controlled using  electrocautery.  Curet used to carefully define the margins of the  previous laminectomy.  An osteotome was then used to resect medial  facets, both right and left side, at the L4-L5 level expected.  After  the [facility] 4 was placed over the right transverse process at the  expected L5 level, the intraoperative radiograph demonstrated the  [place] just at the level of the disk at L4-L5 identifying this correct  level.  Following facet resection medially, the inferior articular  process of L4 bilaterally, then a 3 mm Kerrison was able to used to  further resect superior articular process of L5 bilaterally and to  decompress the lateral recesses, both sides.  Foraminotomy performed  over both L5 nerve roots using a 3 mm Kerrison and light and loop  magnification was used for this, first the left side, then the right  side.  Decompression carried superiorly and the lateral recesses at the  L3-L4 segment was decompressed, both sides.  The ligamentum flavum  resected as well as the medial articular process of the superior portion  of L4 decompressed.  The patient then underwent further decompression  using 3 mm Kerrisons out to the neuroforamen at the L4 level,  decompressing the L4 nerve roots, both sides, right and left sides.   Attention was then turned to careful exposure of the transverse process  at L4 and L5 over the lateral aspect of the L3-L4 facet, preserving  facet capsule bilaterally, and then at the L5 level, resecting further  portions of the superior articular process of L5 bilaterally.  The C-arm fluoroscopy was then used to  carefully place an awl for  determination of the correct position and alignment for entry into the  pedicle, first on the left side at L4, making an entry point with an  awl; then using a straight pedicle finder, probed the pedicle to about  40-45 mm.  A 6.25 screw was used on the left side x 45 mm.  On the left  side at the L5 level, then the transverse process was identified at its  entry point with the pedicle at the intersection of the transverse  process with the lateral aspect of the pedicle identified using an awl,  then the straight pedicle finder also used to probe  the pedicle here.  Note that following probing of the pedicles with the pedicle finders, a  ball-tipped probe was used to carefully probe each of the openings.  Tapping was performed at the L4 level using a 5.5 tap, then a 6.25 x 45  mm screw placed on the left side at L4, and then on the left side at L5  level using a 6.25 tap and then placing a 7.0 screw on the left side at  the L5 level.  Note that the transverse process was decorticated using a  high-speed bur and VITOSS was placed out posterolaterally, obtaining  bone marrow aspirate from the left side L4 entry port into the pedicle  using the bone marrow aspirate device, entering into the pedicle and  into the body of L4 on left side, aspirating some 10 mL of bone marrow  and using this, along with the VITOSS, for charging the VITOSS and  placing it appropriately.   Attention was then turned to the right side where similarly an awl was  used to make an entry point over the lateral aspect of the pedicle of L4  and at the intersection of the transverse process over the lateral  aspect of the pedicle in its superior articular process at L4.  Then the  pedicle finder was then used to probe the pedicle 45 mm at L4 on the  right, again tapping with the 5.5 tap and then placing a 6.25 screw x 45  mm on the right side at the L4 level.  Decortication of the transverse   process was carried out.  Unfortunately, the transverse process was  fractured with placement of the pedicle screw; however, VITOSS and  decortication were able to be carried out appropriately.   Attention was turned to the right side at the L5 level where, again, an  awl was used to make an entry into at the intersection of the transverse  process of L5 with the lateral aspect of the pedicle at L5.  A straight  pedicle finder was then used to probe the pedicle, the corrected area of  convergence lordosis.  The C-arm fluoroscopy noted it to be in good  position and alignment, again probing to 45 mm.  The ball-tipped probe  was used to ensure patency of the pedicle opening without any evidence  of broach or cortex.  Tapping with a 6.25 tap and then placing a 7.0  screw x 45 mm to correct the area of convergence and lordosis.  Decorticating the transverse process of L5 prior to this.  Then inserting VITOSS that had been charged with bone marrow previously.   Once this was completed, each of the pedicle screws was carefully tested  using the testing equipment and dura monitoring.  The left L4 pedicle  screw measuring at a 62, the left at L5 at 29; the right L4 at  approximately 31, and then the right L5 at 28.  These were felt to be  acceptable pedicle screw soft tissue resistance values.  Irrigation was  carried out.  On the left side, a 35 mm rod was then placed within the  fasteners after loosening the fasteners with the correct device.  Then  placing the rod, caps were then placed at the L4 fastener and then L5  fastener.  On the right side, a 45 mm length rod was placed into the  fasteners and captured using the fastener caps.  With this completed and  tightening performed of the fasteners  to the rod on both sides to 80  foot pounds.  This completed the instrumentation of the L4-L5 level and  the slip at this level.   Local bone graft that had been obtained was then added over the   posterior aspect of the VITOSS area, extending from the transverse  process of L4 to L5 bilaterally and beneath the pedicle screw and rod  construct medially and anteriorly.  Following this, then, careful  inspection of the nerve roots demonstrated no nerve compression present.  A small amount of bone material that had found its way into the  laminotomy region was carefully removed and replaced over the  posterolateral region.  A hockey stick neuro probe could be easily  passed out both L5 neuroforamina and both L4 neuroforamina.  The lateral  recess at L3-L4 was felt to be well decompressed.  A medium Hemovac  drain was placed in the depth of the incision on the left side,  extending out of the right lower lumbar spine.  There was no active  bleeding present.  The Viper retractors were released following further  irrigation.  A small amount of Gelfoam was placed over the posterior  laminotomy region at L4-L5.   Next the lumbodorsal fascia was reapproximated to the spinous process  and inter spinous ligaments, L2 and L3 superiorly over the lower L5 and  S1 levels inferiorly.  The paralumbar muscles were approximated over the  laminotomy region using interrupted #1 Vicryl sutures.  The lumbodorsal  fascia was approximated in the midline using interrupted #1 Vicryl  sutures in simple fashion.  The deep subcutaneous layers were  approximated with interrupted #1 Vicryl sutures; the more superficial  layers with interrupted 0 and 2-0 Vicryl sutures; and the skin was  closed with a running subcuticular sinus of 4-0 Vicryl.  Dermabond was  then applied.  4 x 4's and ABD pads were affixed to the skin with  Hypafix tape.  The patient was then returned to a supine position.  Note  that intraoperative permanent images were obtained in AP and lateral  planes documenting the internal fixation at L4-L5.  Also note that intraoperative neuro monitoring demonstrated no abnormal nerve changes  throughout  the case.  Following return to the supine position, the  patient was reactivated, extubated, and returned to the recovery room in  satisfactory condition.  All instrument and sponge counts were correct.      Kerrin Champagne, M.D.  Electronically Signed     JEN/MEDQ  D:  11/26/2006  T:  11/27/2006  Job:  [id number]

## 2010-10-27 NOTE — Procedures (Signed)
[facility]  Patient:    [name], [name]                         MRN: [id number] Adm. Date:  [id number] Disc. Date: [id number] Attending:  Berry, Jonathan [place] CC:         [facility], Second Floor  [facility], [place] [address], [place] [address]  Everlene Other, M.D.   Procedure Report  INDICATIONS:  Ms. Rosado is a 77 year old white female patient of Dr. Everlene Other referred for evaluation of PVOD on December 19, 1999, with a history of complaints of hip and buttock claudication.  She underwent a peripheral angiography on July 26 revealing distal abdominal aortic stenosis.  She presents August 21 for endovascular therapy.  DESCRIPTION OF PROCEDURE:  The patient is brought to the second floor [facility] in the postabsorptive state.  She is premedicated with p.o. Valium.  Her right groin was prepped and shaved in the usual sterile fashion.  One percent Xylocaine was used for local anesthesia.  An 8-French long quarter sheath was inserted into the right femoral artery using the standard Seldinger technique.  The patient received 2500 units of heparin intravenously.  Visipaque dye was used for the entirety of the case.  Aortic pressure was monitored.  A 0.25 Wholey wire was used to cross the distal abdominal aortic lesion.  PTA and stenting was performed with a P188 9 x 2 PowerFlex deployed at nominal pressures and post dilated with a 10 x 2 PowerFlex resulting in reduction of 80% distal abdominal aortic stenosis to less than 20% residual.  The patient tolerated the procedure well.  An ACT was measured, and the sheath was removed.  The patient left the lab in stable condition.  She will be discharged home in the morning with outpatient followup Doppler studies in the office.  She left the lab in stable condition. DD:  03/09/00 TD:  03/09/00 Job: [id number]

## 2010-10-27 NOTE — H&P (Signed)
[name], [name]                ACCOUNT NO.:  [id number]   MEDICAL RECORD NO.:  [id number]          PATIENT TYPE:  INP   LOCATION:  NA                           FACILITY:  [facility]   PHYSICIAN:  [name], M.D. DATE OF BIRTH:  04/15/1931   DATE OF ADMISSION:  08/09/2005  DATE OF DISCHARGE:                                HISTORY & PHYSICAL   HISTORY OF PRESENT ILLNESS:  This 77 year old patient had L4-5 spinal  surgery in August 2006 and was noted to have a lateral right lower lobe  lesions.  She had a pulmonary embolus postoperatively and was started on  Coumadin. CT scan showed 2 areas in the right lower lobe.  A needle biopsy  of the lung showed neuroendocrine carcinoma.  She had no evidence of spread,  however, on her PET scan.  Pulmonary function tests showed an FVC of 1.61,  an FEV1 of 1.47.  She has history of smoking in the past, no hemoptysis,  fever, or chills.  She was started on chemotherapy.  After she completed  chemotherapy, repeat CT scan showed almost complete resolution of the  lateral lesion, but the medial vascular lesion was still present.   ALLERGIES:  No allergies.   MEDICATIONS:  1.  Lotrel 5/10 mg daily.  2.  Lipitor 80 mg daily.  3.  Hydrocodone as needed for back pain.  4.  She was on Carbatrol 500 mg daily for back pain.  5.  Prilosec 20 mg daily.  6.  She was on Coumadin, but this has been stopped.   PAST MEDICAL HISTORY:  1.  Hypertension.  2.  Hypercholesterolemia.  3.  Obesity.  4.  Diabetes.   FAMILY HISTORY:  Positive for heart disease and vascular disease in her  father, and she has had four members of her family that had cancer.   SOCIAL HISTORY:  She is widowed.  She has 3 children.  One daughter is  accompanying her who also has been treated for cancer.  She quit smoking 2  years ago and does not drink alcohol on a regular basis.   REVIEW OF SYSTEMS:  She is 240 pounds; she is 5 feet tall.  Her weight has  been stable. CARDIAC:  No angina or atrial fibrillation. PULMONARY: No  bronchitis or hemoptysis.  GI: She has had chemotherapy-induced nausea and  no constipation or diarrhea. GU: No dysuria, frequent urination, kidney  disease.  VASCULAR:  Claudication; and she has pain in her leg when walking.  History of pulmonary embolus.  No TIAs.  NEUROLOGIC: She has chronic  headaches.  She also is known to have a 10 x 6 x 8 mm lesion in the left  frontal lobe that was consistent with meningioma. EYES AND ENT: No change in  eyesight or hearing. PSYCHIATRIC:  No psychiatric illnesses. ORTHOPEDIC: She  has back pain as mentioned, as well as some chronic arthritis.   PHYSICAL EXAMINATION:  GENERAL:  She is an obese Caucasian female in no  acute distress.  HEAD:  Atraumatic.  EYES:  Pupils equal, round, and reactive to  light and accommodation.  EARS:  Tympanic membranes are intact.  NOSE: There is no septal deviation.  THROAT: Without lesions.  NECK:  Supple with no thyromegaly.  No carotid bruits.  No supraclavicular  or axillary adenopathy.  CHEST:  Clear to auscultation and percussion.  HEART: Regular sinus rhythm, no murmurs.  ABDOMEN:  Obese.  There is hepatosplenomegaly.  EXTREMITIES: Pulses 2+.  No clubbing or edema.  No calf tenderness now.  BACK:  She has a surgical scar.  NEUROLOGIC: She is oriented x3. Sensory and motor are intact.  SKIN:  Without lesion.   DIAGNOSTIC TESTS:  Repeat pulmonary function tests showed an FVC of 2.37  with an FEV1 of 1.73 and perfusion capacity of 585.   IMPRESSION:  1.  Right lower lobe neuroendocrine cancer status post radiation      chemotherapy.  2.  Hypertension.  3.  Diabetes mellitus.  4.  Hypercholesterolemia.  5.  Status post lumbar laminectomy.  6.  Tobacco abuse.  7.  History of deep vein thrombosis and pulmonary embolus.   PLAN:  Right VATS, wedge resection of right lower lobe lesions and node  resection.           ______________________________  [name], M.D.     DPB/MEDQ  D:  08/07/2005  T:  08/07/2005  Job:  [id number]

## 2010-10-27 NOTE — Discharge Summary (Signed)
NAMECAMBREIGH, [name]                ACCOUNT NO.:  [id number]   MEDICAL RECORD NO.:  [id number]          PATIENT TYPE:  OUT   LOCATION:  XRAY                         [facility]ACILITY[facility]   PHYSICIAN:  Kerrin Champagne, M.D.   DATE OF BIRTH:  [birth year]   DATE OF ADMISSION:  04/23/2005  DATE OF DISCHARGE:  04/23/2005                                 DISCHARGE SUMMARY   ADMISSION DIAGNOSES:  1.  Lumbar spinal stenosis and central bilateral foraminal stenosis L4-5 and      L5-S1.  2.  Type 2 diabetes mellitus, diet controlled.  3.  Gastroesophageal reflux disease.  4.  Hypertension.  5.  Hypercholesterolemia.  6.  Severe chronic constipation.  7.  History of a peptic ulcer disease.  8.  History of renal artery stent placement in 2001.  9.  Status post hysterectomy in 1979.   DISCHARGE DIAGNOSES:  1.  Lumbar spinal stenosis and central bilateral foraminal stenosis L4-5 and      L5-S1.  2.  Type 2 diabetes mellitus, diet controlled.  3.  Gastroesophageal reflux disease.  4.  Hypertension.  5.  Hypercholesterolemia.  6.  Severe chronic constipation.  7.  History of a peptic ulcer disease.  8.  History of renal artery stent placement in 2001.  9.  Status post hysterectomy in 1979.  10. Mild postoperative ileus.   PROCEDURE:  On 02/01/2005 the patient underwent central decompressive  laminectomy at L4-5 and L5-S1 with bilateral decompression L4-L5 and S1  nerve roots performed by Dr. Otelia Sergeant assisted by Maud Deed [facility] under  general anesthesia.   CONSULTATIONS:  None.   BRIEF HISTORY:  The patient is a 77 year old female with history of  progressive increasing neurogenic claudication. MRI study has indicated  severe lumbar spinal stenosis at both L4-5 and L5-S1. Examination has shown  diminished EHL bilaterally with reflexes at the ankle diminished bilaterally  as well. Attempts at conservative management have been unsuccessful. She was  felt to require surgical intervention for  definitive treatment; and was  admitted for the procedure as stated above.   BRIEF HOSPITAL COURSE:  Postoperatively the patient's neurovascular function  of the lower extremities was noted and to be intact. On the first  postoperative day, dressing change was done after removal of her drain.  Dressing changes were done daily; thereafter, and wound was found to be  healing without drainage. The patient did have significant history for  severe chronic constipation with poor tone of her colon; and, therefore, was  treated very strictly and monitored closely for ileus precautions. She was  NPO until flatus and then given clear liquids. She did have hypoactive bowel  sounds with slight distension on the second postoperative day. Her activity  was increased as quickly as tolerated. She was given stool softeners and  laxatives for her bowel movement; and eventually was able to have a bowel  movement on the third postoperative day.   Due to her problems with constipation and distension of her abdomen, she was  slow to progress with physical therapy; but eventually by the fourth  postoperative  day, she was ambulating greater than 100 feet utilizing her  walker. She continued to have neurovascular motor function of the lower  extremities intact. She was afebrile with vital signs stable on the day of  discharge. The patient had been weaned to p.o. analgesics and was  comfortable at the time of discharge with oral analgesics. On 02/05/2005 she  was stable for discharge home with arrangements for home health physical  therapy, durable medical equipment as well.   PERTINENT LABORATORY VALUES:  EKG on admission showed normal sinus rhythm,  normal EKG no significant change since last tracing, confirmed by Dr. Donia Guiles. There is no chest x-ray on the chart at the time of this dictation.  Unfortunately her laboratory values are not on the chart as well. We will  have the record sent back to  review to have these values placed on the  chart; and we will dictate an addendum when they are available.   CONDITION ON DISCHARGE:  Stable.   PLAN:  The patient will have a home evaluation by advanced home care  physical therapy. Durable medical equipment was made available to her. She  will change her dressing daily; and will be allowed to shower in 5 days if  no drainage from her wound. She will continue on a diabetic diet at home;  and is encouraged to have plenty of water intake. She will avoid driving,  lifting, bending, and twisting. She will use a walker for ambulation.   She will continue on her home medications as taken prior to admission and  increased her stool softeners and laxatives as needed.   MEDICATIONS GIVEN AT DISCHARGE:  1.  Percocet 5/325 one to two q.4-6 h. as needed for pain.  2.  Robaxin 500 mg 1 q.8 h. as needed for spasm.   The patient called Dr. Barbaraann Faster office to arrange an appointment 2 weeks from  the day of surgery. I have encouraged her to call the office should she have  questions or concerns prior to that time.      Wende Neighbors, P.A.      Kerrin Champagne, M.D.  Electronically Signed    SMV/MEDQ  D:  04/30/2005  T:  04/30/2005  Job:  [id number]

## 2010-10-27 NOTE — H&P (Signed)
[name], [name]                ACCOUNT NO.:  [id number]   MEDICAL RECORD NO.:  [id number]          PATIENT TYPE:  INP   LOCATION:  1824                         FACILITY:  [facility]   PHYSICIAN:  Theone Stanley, MD   DATE OF BIRTH:  [birth year]   DATE OF ADMISSION:  04/09/2005  DATE OF DISCHARGE:                                HISTORY & PHYSICAL   CHIEF COMPLAINT:  Right side flank pain.   HISTORY OF PRESENT ILLNESS:  [name] is a very pleasant 77 year old  Caucasian female who recently had a central decompression at the L4-S1 by  Dr. Otelia Sergeant on August 24.  Patient did well postoperatively.  She states about  two weeks ago she started to have this right-sided flank pain on and off.  She did not feel there was any alleviating or aggravating factors.  However,  she did notice when the pain presented if she stayed very still it would  help out.  She did get shortness of breath; however, this was associated  with the pain.  She denies any chest pain, shortness of breath, cough  otherwise.  She describes the pain as a punching type pain like somebody  punching you, lasting maximum approximately five to 10 minutes.  She  presented to Dr. Barbaraann Faster office.  Because of her presentations she was  instructed to get a CT angio.  A CT angio was performed which showed a right  subsegmental PE.  In addition, it did show two masses present on that right  side.  Patient never mentioned any history of masses.  It is unclear whether  this is new or not.  Looking over her previous studies it shows that it was  noted in November of 2004 new nodule on the right lung.  Of note, patient  recently had an episode of a gastrointestinal bleed.  Prior to  her surgery  she was seen by a gastroenterologist in [place].  She apparently had either  a sigmoidoscopy or a colonoscopy at that time.  Several polyps were removed  and it seems like this resolved her bleeding.  I did ask whether she ever  was diagnosed with  diverticulosis.  She was not familiar with this term.  She subsequently was seen in [facility] for a colonoscopy, apparently the  gastroenterologist in [place] was unsuccessful in reaching where he needed  to go so he referred her to [facility]  She recently was at [facility] for a  colonoscopy, had a polypectomy.  Apparently the gastroenterologist was  concerned and asked her to come back.  She actually has appointment for this  Wednesday; however, she states she will cancel this.  She has a long history  of constipation using laxatives.  She states that currently she was given a  prescription which helped her regulate her bowels; however, she does not  know what this prescription is.   PAST MEDICAL HISTORY:  1.  Hypertension.  2.  Hyperlipidemia.  3.  Peripheral vascular disease.  4.  Renal artery stenosis.   SURGERIES:  Patient had an aortic stent placed by Dr. Allyson Sabal  secondary to  peripheral vascular disease in September 2001.  It was 80%.  After the stent  placed, it was 20%.  At that time, it was noted that she had renal stenosis  of right side 70% and left side 50%.  There is no indication for  intervention with the restenosis.  Recently had surgery for cervical  decompression.  She had   MEDICATIONS:  1.  Lipitor 80 mg p.o. daily.  2.  Lotrel 5/10 daily.  3.  Prilosec daily.  4.  Aspirin enteric-coated 325 mg daily.   ALLERGIES:  She states that she cannot take the regular aspirin that it  needs to be enteric-coated, otherwise negative.   FAMILY HISTORY:  Daughter has breast cancer.   SOCIAL HISTORY:  Patient lives in [place], is widowed.  Her son lives with  her.  She has three grown children.  She has an extensive history of smoking  pack and a half per day greater than equal 50 years.  She quit 2004.  No  alcohol or illicit drug use.   REVIEW OF SYSTEMS:  Please see HPI, otherwise all systems were negative.   PHYSICAL EXAMINATION:  VITAL SIGNS:  Temperature 97.8, blood  pressure  146/71, pulse 91, respiratory rate 22, saturating 94% on room air.  GENERAL:  Very pleasant 77 year old in no distress lying on gurney.  HEENT:  Pupils are equal, reactive.  Throat clear.  HEART:  Regular in rate and rhythm.  No murmurs, rubs, or gallops  appreciated.  LUNGS:  Clear to auscultation bilaterally.  ABDOMEN:  Soft, nontender, nondistended, obese.  EXTREMITIES:  Mild pitting edema.  No asymmetric abnormalities.  NEUROLOGIC:  Patient is alert and oriented x3.  5/5 strength in upper and  lower extremities.  Cranial nerves II-XII grossly intact.  GENITOURINARY:  Deferred.   LABORATORIES:  Radiology:  As noted, patient had CT angio which did show a  PE; however, it did show two nodules, one 1.7 cm, another 11 mm.  White  count of 9.3, hemoglobin of 13, hematocrit of 41, platelets at 240.  PT of  12, INR 0.9, PTT of 26.  Sodium 139, potassium 3.7, chloride at 102,  bicarbonate at 28, glucose at 92, BUN at 13, creatinine at 0.9, calcium at  8.8.   ASSESSMENT/PLAN:  A 77 year old status post central decompression by Dr.  Otelia Sergeant presenting with right flank pain.  CT angio positive for pulmonary  embolism.  1.  Pulmonary embolism.  Patient will be started on heparin for multiple      reasons.  Number one, she recently had surgery; number two, she has a      history of a gastrointestinal bleed.  Will continue to monitor closely      and start her on Coumadin tomorrow.  2.  Right lung mass.  The patient has two lung masses present on CT.  It      appears that she had something in November of 2004.  I will need to      discuss with the patient whether this has been addressed previously.  I      do not see any pathology report in the computer.  If she states that      this is something new to her I will put for consultation for a CT-guided      biopsy.  3.  Peripheral vascular disease.  Patient is status post aortic stent in     2001.  This does not appear to  be an issue at  this point in time.  4.  Hyperlipidemia.  Continue the Lipitor.  5.  Hypertension.  Continue the Lotrel.  6.  Peripheral access.  Patient will be on heparin and Protonix.      Theone Stanley, MD  Electronically Signed     AEJ/MEDQ  D:  04/09/2005  T:  04/10/2005  Job:  [id number]   cc:   Kerrin Champagne, M.D.  Fax: [phone]   Nanetta Batty, M.D.  Fax: [phone]   Nadine Counts  Fax: [phone]

## 2010-10-27 NOTE — Discharge Summary (Signed)
NAMELINDZEE, GOUGE                ACCOUNT NO.:  [id number]   MEDICAL RECORD NO.:  [id number]          PATIENT TYPE:  INP   LOCATION:                               FACILITY:  [facility]   PHYSICIAN:  Kerrin Champagne, M.D.   DATE OF BIRTH:  04-18-1931   DATE OF ADMISSION:  02/01/2005  DATE OF DISCHARGE:  02/05/2005                                 DISCHARGE SUMMARY   ADDENDUM:  This addendum is for pertinent laboratory values.  These include  CBC on admission with values within normal limits.  Hemoglobin and  hematocrit postoperatively 12.2 and 36.0, respectively.  Coagulation studies  on admission within normal limits.  Chemistry studies on admission were  normal with the exception of glucose 108.  Repeat BMET on February 02, 2005  with potassium 3.4, glucose 113, BUN 5 and remaining values normal.  Urinalysis on August 21 was negative for urinary tract infection.      Wende Neighbors, P.A.      Kerrin Champagne, M.D.  Electronically Signed    SMV/MEDQ  D:  08/29/2005  T:  08/30/2005  Job:  [id number]

## 2010-10-27 NOTE — Procedures (Signed)
[facility]  Patient:    [name], [name]                          MRN: [id number] Attending:  Runell Gess, M.D. CC:         [facility], [place] [address] [place]             Cardiac Catheterization Lab             Marguerite Olea, M.D., [place], [place]                           Procedure Report  [facility] [phone]  INDICATIONS:  Ms. [name] is a 77 year old widowed white female mother of three children who was referred by Dr. Everlene Other for evaluation of bilateral lower extremity claudication.  She apparently had Dopplers before at [facility] which revealed ABIs in the 0.6 range bilaterally.  She complains of hip, buttock, and lower extremity claudication becoming progressively worse over the last several months, limiting her to walking one block and rest.  She presents now for angiography and potential intervention.  DESCRIPTION OF PROCEDURE:  The patient was brought to the second floor [facility] in the postabsorptive state.  She was premedicated with p.o. Valium.  Her left groin was prepped and shaved in the usual sterile fashion.  Xylocaine, 1%, was used for local anesthesia.  A 6-French sheath was inserted into the right femoral artery using standard Seldinger technique.  A 5-French tennis racquet catheter was used for mid stream abdominal aorta and distal abdominal aortography.  A 5-French LIMA catheter was used for selective bilateral renal angiography and bilateral femoral angiography was performed.  ICV dye was used for the entirety of the case.  Pressures were monitored during the case.  Pullback pressures were recorded.  We crossed the ostium of the right renal artery and the mid abdominal aortic stenosis.  ANGIOGRAPHIC RESULTS: 1. Abdominal aorta    a. Renal arteries:  70% right renal artery stenosis with a 25-mm pullback       gradient and 50% left renal artery  stenosis.    b. An 80% concentric, fairly focal, infrarenal abdominal aortic stenosis       with a 60-mm transtenotic gradient. 2. Left lower extremity    a. Absent profunda femoris.    b. Three-vessel ______ . 3. Right lower extremity    a. Three-vessel ______ .  IMPRESSION:  [name] has bilateral renal artery stenosis, right greater than left, with a high grade hemodynamically significant 80% infrarenal abdominal aortic obstruction which is the culprit lesion contributing to the patients claudication symptoms.  This is amenable to PTCA and stenting.  PLAN:  Will hydrate the patient, discharge her today, and will bring her back for percutaneous intervention.  Dr. Everlene Other was notified of these results. The patient left the lab in stable condition. DD:  01/04/00 TD:  01/04/00 Job: [id number]

## 2010-10-27 NOTE — Discharge Summary (Signed)
[name], [name]                ACCOUNT NO.:  [id number]   MEDICAL RECORD NO.:  [id number]          PATIENT TYPE:  INP   LOCATION:  3303                         FACILITY:  [facility]   PHYSICIAN:  [name], M.D. DATE OF BIRTH:  Oct 04, 1930   DATE OF ADMISSION:  08/09/2005  DATE OF DISCHARGE:  08/14/2005                                 DISCHARGE SUMMARY   PRIMARY ADMITTING DIAGNOSIS:  Right lower lobe lung lesion.   ADDITIONAL/DISCHARGE DIAGNOSES:  1.  Squamous cell carcinoma right lower lobe.  2.  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  3.  Hypertension.  4.  Hyperlipidemia.  5.  History of pulmonary embolus on chronic Coumadin.  6.  Gastroesophageal reflux.  7.  Obesity.   PROCEDURES PERFORMED:  1.  Right VATs.  2.  Right mini thoracotomy with right lower lobectomy and lymph node      dissection.   HISTORY:  The patient is a 77 year old female who was found on preoperative  chest x-ray in August 2006 prior to back surgery to have two right lower  lobe lung lesions. Needle biopsy was performed on one of the lesions which  was positive for neuroendocrine carcinoma. A PET scan showed no evidence of  metastatic disease. She was subsequently referred to Dr. Arline Asp and  underwent four cycles of chemotherapy using carboplatin and VP-16.   On a recent follow-up CT scan, she has had almost complete resolution of the  right lower lobe nodule which was biopsied; however, there was no change in  the right lower leg nodule which was more medially position. She  subsequently referred to Dr. Dewayne Shorter for surgical evaluation and  consideration of resection. Dr. Edwyna Shell reviewed her films and agreed that a  right VATs and right lower lobe wedge resection was indicated. He explained  the risks, benefits and alternatives of the procedure to the patient and she  agreed to proceed with surgery.   HOSPITAL COURSE:  She was admitted  to [facility] on August 09, 2005 and was  taken to the operating room where she underwent a right VATs with right  lower lobectomy and lymph node dissection as detailed above. She tolerated  the procedure well and was transferred from the recovery area to the floor  in stable condition. Postoperatively, she has done well. Her final pathology  was positive for squamous cell carcinoma with all nodes negative. Her chest  tubes have been removed in a stepwise fashion. A follow-up chest x-ray is  pending at this time after the removal of her final chest tube. She has been  weaned from supplemental oxygen and is maintaining O2 saturations of greater  than 90% on room air. Her surgical incision sites were all healing well. She  has been afebrile and all vital signs have been stable. She has been  restarted on her Coumadin and at the present her PT is 14.6 with an INR was  0.1. She is ambulating the halls without problems and tolerating a regular  diet.   Her  most recent labs showed hemoglobin of 9.5, hematocrit 27.6, white count  11.2, platelets 253,000. Sodium 136, potassium 3.9, BUN 7, creatinine 0.7.  She will undergo a PA and lateral chest x-ray on the morning of August 14, 2005. It is anticipated that if she continues to remain stable and her chest  x-ray shows no evidence of pneumothorax at that time, she will hopefully be  ready for discharge home.   DISCHARGE MEDICATIONS:  1.  Coumadin home dose will be determined by PT and INR drawn on the day of      discharge.  2.  Tylox 1-2 q.4h. p.r.n. for pain.  3.  Prilosec 20 milligrams daily.  4.  Lipitor 80 milligrams daily.  5.  Lactulose daily.  6.  Lotrel 5/10 mg daily.  7.  Prochlorperazine 10 milligrams p.r.n.   DISCHARGE INSTRUCTIONS:  She is asked to refrain from driving, heavy lifting  or strenuous activity. She may continue ambulating daily and using her  incentive spirometer. She may shower daily and clean her incisions with soap   and water. She will continue her same preoperative diet.   DISCHARGE FOLLOWUP:  She will see Dr. Edwyna Shell back in the office in one week  and will have a chest x-ray prior to this visit at [facility].  She will need to follow up within 48 hours of discharge for PT and INR to  monitor Coumadin dose. She will follow up with Dr. Arline Asp as directed.      Coral Ceo, P.A.    ______________________________  [name], M.D.    GC/MEDQ  D:  08/13/2005  T:  08/14/2005  Job:  [id number]   cc:   Samul Dada, M.D.  Fax: [phone]   Nadine Counts  Fax: [phone]   CVTS Office

## 2010-10-27 NOTE — Discharge Summary (Signed)
[name], SCAFF                ACCOUNT NO.:  [id number]   MEDICAL RECORD NO.:  [id number]          PATIENT TYPE:  INP   LOCATION:  2004                         FACILITY:  [facility]   PHYSICIAN:  Theone Stanley, MD   DATE OF BIRTH:  [birth year]   DATE OF ADMISSION:  04/09/2005  DATE OF DISCHARGE:  04/23/2005                                 DISCHARGE SUMMARY   ADMITTING DIAGNOSES:  Right flank pain, hypertension, hyperlipidemia,  peripheral vascular disease, renal artery stenosis.   DISCHARGE DIAGNOSES:  Right flank pain secondary to pulmonary embolism,  status post central decompression at L4-S1, pulmonary mass, biopsy reveals  high-grade neuroendocrine carcinoma, hypertension, hyperlipidemia,  peripheral vascular disease, history of renal artery stenosis.   CONSULTATIONS:  1.  Dr. Arline Asp with oncology.  2.  Radiology.   PROCEDURES AND DIAGNOSTIC TESTS:  1.  The patient had a CT angio on October 30 which showed multiple tiny      subsegmental emboli to the right lower lobe, 1.7 cm speculated soft      tissue mass in the superior segment of the right lower lobe with a      second smaller, more medial lesion in the same lobe.  2.  The patient had a CT-guided biopsy on November 6.  Results sent to      pathology which showed high-grade neuroendocrine carcinoma.  3.  A CT of the abdomen and pelvis was performed on November 9, which showed      a right lower lobe pulmonary speculated nodules again demonstrated.      Aortic stent.  Postoperative changes, 1.8 cm left adrenal node.  There      is a 1 cm hyperdensity in the left kidney too small to characterize.  CT      of the pelvis: No acute intra-abdominal pathology.  4.  An MRI of the brain was performed on November 10, which showed no      evidence of metastatic disease of the brain  A 10 x 6 x 8 mm dural base      lesion in the left frontal lobe most compatible with meningoma.      Perivascular and subcortical white matter  changes are nonspecific but      likely reflect sequelae of chronic microvascular ischemia.  A 6 mm      peripheral T2 hyperintense left parotid lesion.  5.  A PET scan was performed on November 14.  Impression:  Two areas of      abnormal FDG uptake in the right lower lobe corresponding to the      previously seen pulmonary parenchymal masses of which the larger has      been biopsy proven as neuroendocrine carcinoma.  No evidence for distant      metastases or contralateral pulmonary uptake.   HOSPITAL COURSE:  Mrs. Mcgrory is a very pleasant 77 year old female who  recently had an L4-S1 central decompression February 01, 2005, and went to Dr.  Barbaraann Faster office complaining of right-sided pain.  Because of this, Dr. Otelia Sergeant  ordered a CT angio which was  consistent with a right-sided pulmonary  embolus.  The patient was admitted at that time.  Also of note, it was noted  that she had pulmonary masses present.  It was felt that this should also be  investigated.  The patient was started on heparin and a CT-guided biopsy was  performed on November 6.  Results came back several days later which showed  high grade neuroendocrine carcinoma.  At that point in time, oncology was  consulted.  Dr. Arline Asp was kind enough to come by and give advice as to  further workup which included an abdomen and pelvis CT scan, MRI of the  brain, and PET scan.  During this time, the patient was continued on her  heparin and subsequently started on Coumadin.  On her discharge she was  therapeutic.  Prior to discharge she did have a PET scan November 14 which  did not show any evidence of metastases.  On November 14, the patient was  very eager to go home and since she was therapeutic on her INR and had been  therapeutic for greater than 24 hours, it was felt that she could be  discharged at that time.  There was concern about followup.  In regard to  this, subsequently I was able to contact Dr. Harrison Mons, her primary care   physician, and informed him of the situation and stated that she should  follow up in 3 days for an INR check.  Her primary care physician understood  this.  Dr. Arline Asp is to follow up with her also.   DISCHARGE MEDICATIONS:  1.  Coumadin 5 mg 1 p.o. daily.  2.  Lipitor 80 mg 1 p.o. daily.  3.  Lotrel 5/10 mg 1 p.o. daily.  4.  Prilosec 1 p.o. daily.  The patient also took a medication for chronic constipation which appeared  to be lactulose.   FOLLOWUP:  The patient is to follow up with Dr. Harrison Mons, her primary care  physician, in 3 days for an INR check and Dr. Arline Asp in a week's time.      Theone Stanley, MD  Electronically Signed     AEJ/MEDQ  D:  04/27/2005  T:  04/29/2005  Job:  [id number]   cc:   Samul Dada, M.D.  Fax: [phone]   Nadine Counts  Fax: [phone]

## 2010-10-27 NOTE — Op Note (Signed)
[name], [name]                ACCOUNT NO.:  [id number]   MEDICAL RECORD NO.:  [id number]          PATIENT TYPE:  INP   LOCATION:  3303                         FACILITY:  [facility]   PHYSICIAN:  [name], M.D. DATE OF BIRTH:  1930/06/16   DATE OF PROCEDURE:  08/09/2005  DATE OF DISCHARGE:                                 OPERATIVE REPORT   PREOPERATIVE DIAGNOSIS:  Right lower lobe neoendocrine cancer with second  nodule.   POSTOPERATIVE DIAGNOSIS:  Right lower lobe neoendocrine cancer with second  nodule status post chemotherapy.   OPERATION PERFORMED:  Right video assisted thoracoscopic surgery lobectomy  with mini-thoracotomy.   SURGEON:  [name], M.D.   FIRST ASSISTANT:  Rowe Clack, P.A.-C.   ANESTHESIA:  General.   After the percutaneous insertion of all monitoring lines, the patient was  turned to the right lateral thoracotomy position and was prepped and draped  in the usual sterile manner.  Two trocar sites were made in the anterior and  posterior axillary line, the anterior one was at the seventh intercostal  space, the posterior was at the eighth intercostal space.  Two trocars were  inserted.  The patient was very obese and had a very high diaphragm on the  right side which made seeing the right lower lobe difficult with the scope.  The patient had two lesions and needle biopsy showed neuroendocrine and had  3-4 cycles of chemotherapy.  An access incision was made at the sixth  intercostal space between the anterior axillary line going toward the mid  axillary line, about 7-8 cm incision.  The serratus was split and the  interspace was entered.  A small retractor was used to hold the deep  subcutaneous tissues out and with minimal spreading of the ribs.  Another  small access incision was made in the triangle of auscultation.  This was to  hold the retractor forceps.  The diaphragm was elevated and we had to bring  in a number 1 Prolene suture  through the anterior trocar site through the  dome of the diaphragm and we used a felt pledget to hold the stitch and then  bring it out through the subcutaneous tissue to pull the dome of the  diaphragm down to allow [place] better access to the right lower lobe.  The  inferior pulmonary ligament was taken down with electrocautery and using the  scope as a guidance, the inferior pulmonary vein was identified and a number  12 red rubber Robinson catheter was passed around it and through that, we  resected it with two applications of the Autosuture 30 white Roticulator.  Then, attention was turned to the fissure, the inferior portion of the major  fissure was identified and I could see where the artery was and dissection  was started over the artery and the basilar branch was dissected out.  There  was a small branch to the right middle lobe that had to be clipped in order  to gain access to the fissure.  The fissure was then divided with the  Autosuture 45 green  stapler.  Then, we dissected out the basilar branch.  We  attempted to first divide them with the stapler but could not get the right  angle to divide it so we ended up ligating the basilar branches proximally  and then distally on the two big branches, using Liga clips between them,  and dividing the basilar branch.  We then dissected out several 11R nodes  and this exposed the segmental branch and this was also divided between Liga  clips and 0 ties.  The rest of the major fissure was divided with the  Autosuture green stapler.  The bronchus was dissected out, looped with an  umbilical tape, and then stapled with an Ethicon stapler and the right lower  lobe bronchus was divided and the right lower lobe was placed in an Ethicon  retrieval bag and removed.  The two lesions were marked with stitches and  sent for frozen section.  CoSeal was applied to the staple line.  A Marcaine  block was done in the usual fashion.  The On-Q was placed  subpleural in the  usual fashion.  The posterior access incision was closed with 3-0 Vicryl.  A  right angle chest tube was placed in the inferior trocar site and in the  anterior trocar site, a straight 28 chest tube.  These were tied in place  with 0 silk.  The access incision was closed with two pericostals, #1 Vicryl  in the muscle layer, and 3-0 Vicryl subcuticular stitch.  The patient was  returned to the recovery room in stable condition.           ______________________________  [name], M.D.     DPB/MEDQ  D:  08/09/2005  T:  08/09/2005  Job:  [id number]   cc:   Samul Dada, M.D.  Fax: [phone]

## 2010-10-27 NOTE — Consult Note (Signed)
[name], [name]                ACCOUNT NO.:  [id number]   MEDICAL RECORD NO.:  [id number]          PATIENT TYPE:  INP   LOCATION:  2004                         FACILITY:  [facility]   PHYSICIAN:  Samul Dada, M.D.DATE OF BIRTH:  02/25/1931   DATE OF CONSULTATION:  04/19/2005  DATE OF DISCHARGE:                                   CONSULTATION   REASON FOR CONSULTATION:  Lung cancer.   REFERRING PHYSICIAN:  Dr. Jasmine Pang   HISTORY OF PRESENT ILLNESS:  [name] is a pleasant 77 year old white  female with a recent history of L4-S1 central decompression on February 01, 2005 admitted on April 09, 2005 with multiple pulmonary emboli for CT  angio.  An incidental 1.7 x 1.7 cm spiculated soft tissue nodule was seen  along with a more medial right lower lobe 1.2 cm nodule.  This mass in the  right lower lobe was first noted on April 20, 2003.  CT was recommended at  that time for follow-up.  The patient does not remember to know whether this  was ever done.  The chest x-ray on January 04, 2000 showed no nodules.  A  needle biopsy on April 16, 2005 was performed revealing high-grade  neuroendocrine carcinoma mostly small cell with some areas containing larger  cells.  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  In addition, a left adrenal gland mass  is seen.  We were asked to see the patient with further recommendations.   PAST MEDICAL HISTORY:  1.  History of colon polyps which were to be followed up as an outpatient      visit at [facility].  We have requested records.  2.  Hypertension.  3.  Hyperlipidemia.  4.  Peripheral vascular disease followed by Dr. Allyson Sabal.  5.  Renal artery stenosis.  6.  History of tobacco abuse, quitting in 2004.  7.  Multiple pulmonary embolism per CT angio during this admission,      currently on heparin per pharmacy and Coumadin.   PAST SURGICAL HISTORY:  1.  Status post  L4-S1 central decompression, Dr. Otelia Sergeant on February 01, 2005.  2.  Status post colonoscopy and polypectomy in [place], awaiting records.  3.  Status post catheterization and stent in September of 2001.  4.  Status post right lung biopsy on April 16, 2005.  5.  Status post left rotator cuff surgery in November 2004.   ALLERGIES:  ASPIRIN if it is not enteric-coated causing GI upset.   CURRENT MEDICATIONS:  1.  Norvasc 5 mg daily.  2.  Lipitor 80 mg q.h.s.  3.  Lotensin 10 mg daily.  4.  Heparin per pharmacy.  5.  Protonix 40 mg daily.  6.  Coumadin 5 mg daily.  7.  Tylenol p.r.n.  8.  Combivent two puffs q.i.d. p.r.n.  9.  Benadryl p.r.n.  10. Chronulac p.r.n.  11. Ambien p.r.n.   REVIEW OF SYSTEMS:  Remarkable for fatigue, intermittent headaches, dyspnea  on exertion, nonproductive cough, constipation, and right  claudication  symptoms.   FAMILY HISTORY:  Mother died with pneumonia in 72.  Father died young,  after breaking his neck during an accident.  She has five sisters who are  deceased, four of cancer of unknown type.  One brother alive with a history  of breast cancer.  She has one brother with a history of MI.   SOCIAL HISTORY:  Patient is widowed.  She has three grown children.  Her son  lives with her.  She is retired from [employer].  She quit in 2004  the use of half pack to one pack a day of cigarettes for about 50 years.  She lives in [place].  No alcohol history.   PHYSICAL EXAMINATION:  GENERAL:  This is a well-developed 77 year old white  female in no acute distress.  Alert and oriented x3, but hard of hearing.  VITAL SIGNS:  Blood pressure 135/72, pulse 79, respirations 20, temperature  97.4.  Pulse oximetry 95% on room air.  Weight 221, height 64.  HEENT:  Normocephalic, atraumatic.  PERRLA.  Oral mucosa without thrush or  lesions.  NECK:  Supple.  No cervical or supraclavicular masses.  LUNGS:  Clear to auscultation bilaterally.  No axillary  masses.  CARDIOVASCULAR:  Regular rate and rhythm with 1/6 systolic murmur, a  questionable rub.  No gallop.  ABDOMEN:  Obese, nontender.  Bowel sounds x4.  No palpable spleen or liver.  There is an abdominal bruit heard bilaterally more pronounced on the right.  GENITOURINARY:  Deferred.  RECTAL:  Deferred.  EXTREMITIES:  Remarkable for slightly cooler left lower extremity with  decreased pedal pulses, no clubbing or edema.  SKIN:  Without lesions, bruising, or petechiae.  NEUROLOGIC:  Nonfocal.   LABORATORIES:  Hemoglobin 13.6, hematocrit 40.6, white count 10.7, platelets  212, neutrophils 5.5, MCV 89.  PT 16, PTT 26, INR 1.3.  Sodium 139,  potassium 4, BUN 7, creatinine 0.8, glucose 110.  Hemoccult negative.   ASSESSMENT/PLAN:  Dr. Arline Asp has seen and evaluated the patient and the  chart has been reviewed.  As stated above, the patient was found to have  during a work-up a right lower lung mass going back to 2004.  Apparently CTs  were recommended at that time but she does not remember whether this was  ever done.  She underwent a needle biopsy of the lesion on April 16, 2005  revealing high-grade neuroendocrine carcinoma, mostly small cell with some  areas containing larger cells.  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  There is a  second smaller lesion in the right lower lobe medially located and one mass  involving the left adrenal gland.  We will plan to review imaging studies  with radiologist but it seems that the right lower lobe lesion has not  changed in size significantly since November of 2004.  Will check her LDH,  albumin, CEA.  She is to have a PET scan and a brain MRI soon and we will  follow up on the results.  She is  currently on heparin and being converted to Coumadin.  She has a history of GI bleeding and colonic polyp mass of great concern.  Records are needed.  Will check the stools.  Thank  you very much for allowing [place] the opportunity  to participate in the care of [name].      Marlowe Kays, P.A.  Samul Dada, M.D.  Electronically Signed    SW/MEDQ  D:  04/20/2005  T:  04/20/2005  Job:  [id number]   cc:   Nadine Counts  Fax: [phone]

## 2010-10-27 NOTE — Op Note (Signed)
NAMEEVALETTE, MONTROSE                ACCOUNT NO.:  [id number]   MEDICAL RECORD NO.:  [id number]          PATIENT TYPE:  INP   LOCATION:  5037                         FACILITY:  [facility]   PHYSICIAN:  Kerrin Champagne, M.D.   DATE OF BIRTH:  [birth year]   DATE OF PROCEDURE:  02/01/2005  DATE OF DISCHARGE:                                 OPERATIVE REPORT   PREOPERATIVE DIAGNOSIS:  Lumbar spinal stenosis, central bilateral foraminal  L4-5 and L5-S1.   POSTOPERATIVE DIAGNOSIS:  Lumbar spinal stenosis, central bilateral  foraminal L4-5 and L5-S1.   PROCEDURE:  Central decompressive laminectomy at L4-5 and L5-S1 with  bilateral decompression, L4, L5 and S1 nerve roots.   SURGEON:  Kerrin Champagne, M.D.   ASSISTANT:  Wende Neighbors, P.A.   ANESTHESIA:  GOT, Guadalupe Maple, M.D.   ESTIMATED BLOOD LOSS:  75 mL.   DRAINS:  Foley to straight drain.   CLINICAL HISTORY:  The patient is a 77 year old female with history of  progressive increasing neurogenic claudication.  She has had pain with  standing and ambulation, with pain radiating down the right leg greater than  left.  She has undergone attempts at conservative management.  Difficulty  walking more than to the bathroom before she has to sit and rest.  She has  no bowel and bladder symptoms.  Underwent evaluation with MR study that  demonstrated severe lumbar spinal stenosis at both L4-5 and L5-S1.  She has  diminished EHL of both sides.  Reflexes at the ankle are diminished, both  sides, as well.   INTRAOPERATIVE FINDINGS:  Severe lateral recess stenosis, L5-S1, affecting  the S1 nerve roots bilaterally.  Bilateral foraminal narrowing causing  bilateral L5 nerve root entrapment.  Moderately severe lumbar spinal  stenosis at L4-5 centrally with lateral recess stenosis at this level as  well.   DESCRIPTION OF PROCEDURE:  After adequate general anesthesia, the patient in  a prone position using an Andrews table in the flexed hip  position.  All  pressure points well-padded, standard preoperative antibiotics, standard  prep with DuraPrep solution.  Draped in the usual manner.  Initial spinal  needles were placed at the expected L4 and L5 level.  Intraoperative lateral  radiograph demonstrated the needles below and above the spinous process at  L5.  Incision was made following the infiltration of Marcaine 0.5% with  1:200,000 epinephrine, the incision approximately 8 cm in length through the  skin and subcutaneous layers, carried down to the lumbar dorsal fascia in  the midline, incised on both sides of the spinous processes of L3, L4, L5  and S1.  A Cobb used to elevate the paralumbar muscles bilaterally, dividing  their attachments off the inferior aspect of the lamina at each of L3, L4,  L5.  Clamps then placed over the spinous process of L5 and L4,  intraoperative lateral radiograph demonstrating clamps on the said spinous  process, and these were marked with Beyer rongeur for continued  identification throughout the procedure.  A Boss McCullough retractor was  inserted for self-retaining retraction purposes.  Loupe  magnification and  head lamp used.  During the initial portions of the procedure, the spinous  process of L4 and L5 were resected, the inferior aspect of the spinous  process of L3 resected about 30%, the superior aspect of S1 by about 15-20%.  Central portions of the lamina of L4 and L5 were then carefully thinned  using the Leksell rongeur.  A 3 mm Kerrison then used to resect central  portions of the lamina, decompressing the central portion of the canal, and  a half-inch osteotome then used to perform medial facetectomy bilateral at  the L5-S1 level, approximately 15% of the medial joint surface bilaterally.  Ligamentum flavum then resected at the L4-5 and L5-S1 levels.  Recess  decompressed at the L5-S1 level by first performing foraminotomy over the S1  nerve root and then using osteotomes to  perform a medial facetectomy,  removing bone out to the level of the medial aspect of the facet at the S1  level.  This adequately decompressed the lateral recess, the right S1 nerve  root particularly noted to be severely pinched, associated with the anterior  aspect of the facet at the L5-S1 level.  On the left side, similarly  foraminotomy performed over the S1 nerve root and osteotome used to resect  the medial facet over about 15-20%, decompressing lateral recess.  The  superior aspect, superior articular process of S1 was resected as well as  the reflected portion of the ligamentum superiorly at this point,  decompressing the neural foramen for the L5 nerve root.  Recess decompressed  the L4-5 level using osteotome to resect medial portion of the medial facet,  L4-5, about 10% each side.  Then debrided the ligamentum flavum until the L5  nerve root was felt to be completely free.  A hockey stick neuro probe could  then be passed out the L4 nerve root bilaterally without difficulty.  Ligamentum flavum at the L3-4 level was similarly resected using 3 and 4 mm  Kerrison and a hockey stuck neuro probe used to probe the neural foramen and  lateral recess at this level, demonstrating no further compression of the  thecal sac.  The thecal sac showed a normal appearance with normal pulsation  following the decompression.  Irrigation was performed, bone wax applied to  the bleeding cancellous bone surfaces and then thrombin-soaked Gelfoam  centrally.  The soft tissue then allowed to fall back into place.  Note that  the microscope was draped and brought into the field for the remaining  portion of the case and using a hockey stick neuro probe, each of the neural  foramen of L5, S1, L4, L3 bilaterally were probed, ensuring the patency and  adequate decompression.  The scope was then brought out of the field.  Soft  tissues were reapproximated by approximating the paralumbar muscles in  the midline using interrupted #1 Vicryl sutures loosely.  There was no active  bleeding evident.  The lumbar dorsal fascia reattached to the remaining  portion of the spinous process of L3 and of S1 and then the lumbar dorsal  fascia reapproximated in the midline with interrupted #1 Vicryl sutures in  simple and figure-of-eight fashion.  Deep subcu layers approximated with  interrupted #1, then 0 Vicryl sutures, the more subcu layers were  approximated with interrupted 2-0 Vicryl sutures, and the skin closed with a  running subcu stitch of 4-0 Vicryl.  Tincture of Benzoin and Steri-Strips  applied, 4 x 4's, ABD pad affixed to  the skin with Hypafix tape.  The  patient was then returned to a supine position and reactivated and extubated  and returned to the recovery room in a satisfactory condition.  All  instrument and sponge counts were correct.      Kerrin Champagne, M.D.  Electronically Signed     JEN/MEDQ  D:  02/01/2005  T:  02/02/2005  Job:  [id number]

## 2010-10-27 NOTE — Op Note (Signed)
NAMEKANSAS, SPAINHOWER                            ACCOUNT NO.:  [id number]   MEDICAL RECORD NO.:  [id number]                   PATIENT TYPE:  OUT   LOCATION:  [facility]                                 FACILITY:  [facility]   PHYSICIAN:  Feliberto Gottron. Turner Daniels, M.D.                DATE OF BIRTH:  10-Aug-1930   DATE OF PROCEDURE:  04/21/2003  DATE OF DISCHARGE:  04/21/2003                                 OPERATIVE REPORT   PREOPERATIVE DIAGNOSES:  Left shoulder impingement syndrome with subacromial  spurring.   POSTOPERATIVE DIAGNOSES:  Left shoulder impingement syndrome with  subacromial spurring.  Partial thickness labral tear and subclavicular spur  as well.   OPERATION PERFORMED:  Left shoulder arthroscopic anterior inferior  acromioplasty, distal clavicle spur excision and debridement of partial  biceps and labral tear.   SURGEON:  Feliberto Gottron. Turner Daniels, M.D.   ASSISTANTLaural Benes. Jannet Mantis.   ANESTHESIA:  Interscalene block plus general endotracheal.   ESTIMATED BLOOD LOSS:  Minimal.   FLUIDS REPLACED:  800 mL of crystalloid.   DRAINS:  None.   TOURNIQUET TIME:  None.   INDICATIONS FOR PROCEDURE:  The patient is a 77 year old woman injured at  work with left shoulder impingement syndrome with failed conservative  treatment with anti-inflammatory medicines, physical therapy and got only  temporary relief from anti-inflammatory medicines and steroid shot.  Plain  radiographs showed a fairly impressive subacromial spur and a preoperative  MRI scan showed acromioclavicular joint arthritis and some rotator cuff  impingement but no cuff tear.  Because of persistent pain and disability,  she was taken for arthroscopic evaluation and treatment of her left  shoulder.   DESCRIPTION OF PROCEDURE:  The patient was identified by arm band and taken  to the operating room at [facility] where appropriate  anesthetic monitors are attached and left shoulder interscalene block  anesthesia  accomplished followed by general endotracheal anesthesia.  The  patient was placed in the beach chair position.  The left upper extremity  was prepped and draped in the usual sterile fashion from the wrist to the  hemithorax.  Using a #11 blade, standard portals were then made 1.5 cm  anterior to the acromioclavicular joint, lateral to the junction of the  middle and posterior thirds of the acromion, posterior to the posterolateral  corner of the acromion process.  The inflow was placed anteriorly.  The  arthroscope laterally and a 4.2 great white sucker shaver posteriorly  allowing subacromial bursectomy outlining the subacromial spur which was  then removed with a 4.5 hooded Vortex bur as was a spur in the distal  clavicle.  We evaluated the rotator cuff, found minimal external leaflet  tearing and this was likely debrided.  The scope was then inserted into the  glenohumeral joint using a posterior portal where we further debrided the  biceps anchor and labrum where it  was partially torn with a 4.2 great white  sucker shaver.  The articular cartilage of the glenohumeral joint looked to  be in good condition.  The shoulder was then washed out with normal saline  solution.  The subscapularis tendon was intact as was the inferior and  anterior aspects of the labrum.  The arthroscopic instruments were removed.  A dressing of Xeroform, 4 x 4 dressing sponges and paper tape applied  followed by a sling.  Patient awakened and taken to the recovery room  without difficulty.                                               Feliberto Gottron. Turner Daniels, M.D.    Ovid Curd  D:  05/17/2003  T:  05/18/2003  Job:  [id number]

## 2010-11-30 ENCOUNTER — Other Ambulatory Visit: Payer: Self-pay | Admitting: Radiation Oncology

## 2010-11-30 ENCOUNTER — Ambulatory Visit
Admission: RE | Admit: 2010-11-30 | Discharge: 2010-11-30 | Disposition: A | Discharge status: Home / Self Care | Payer: Medicare Other | Source: Ambulatory Visit | Attending: Radiation Oncology | Admitting: Radiation Oncology

## 2010-11-30 DIAGNOSIS — C3490 Malignant neoplasm of unspecified part of unspecified bronchus or lung: Secondary | ICD-10-CM

## 2010-12-14 ENCOUNTER — Other Ambulatory Visit (HOSPITAL_COMMUNITY): Payer: Self-pay | Admitting: Oncology

## 2010-12-14 ENCOUNTER — Encounter (HOSPITAL_BASED_OUTPATIENT_CLINIC_OR_DEPARTMENT_OTHER): Payer: Medicare Other | Admitting: Oncology

## 2010-12-14 DIAGNOSIS — C3490 Malignant neoplasm of unspecified part of unspecified bronchus or lung: Secondary | ICD-10-CM

## 2010-12-14 DIAGNOSIS — C3430 Malignant neoplasm of lower lobe, unspecified bronchus or lung: Secondary | ICD-10-CM

## 2010-12-14 LAB — CBC WITH DIFFERENTIAL/PLATELET
BASO%: 0.3 % (ref 0.0–2.0)
EOS%: 3.5 % (ref 0.0–7.0)
HGB: 14.9 g/dL (ref 11.6–15.9)
MCH: 31.6 pg (ref 25.1–34.0)
MCHC: 34.1 g/dL (ref 31.5–36.0)
MCV: 92.6 fL (ref 79.5–101.0)
MONO%: 7.1 % (ref 0.0–14.0)
RBC: 4.71 10*6/uL (ref 3.70–5.45)
RDW: 13.3 % (ref 11.2–14.5)
lymph#: 2 10*3/uL (ref 0.9–3.3)

## 2010-12-14 LAB — COMPREHENSIVE METABOLIC PANEL
ALT: 14 U/L (ref 0–35)
AST: 21 U/L (ref 0–37)
Albumin: 4.4 g/dL (ref 3.5–5.2)
Alkaline Phosphatase: 41 U/L (ref 39–117)
Calcium: 9.3 mg/dL (ref 8.4–10.5)
Chloride: 104 mEq/L (ref 96–112)
Potassium: 4.3 mEq/L (ref 3.5–5.3)
Sodium: 137 mEq/L (ref 135–145)

## 2010-12-26 ENCOUNTER — Other Ambulatory Visit: Payer: Self-pay | Admitting: Thoracic Surgery

## 2010-12-26 DIAGNOSIS — C3410 Malignant neoplasm of upper lobe, unspecified bronchus or lung: Secondary | ICD-10-CM

## 2010-12-27 ENCOUNTER — Ambulatory Visit
Admission: RE | Admit: 2010-12-27 | Discharge: 2010-12-27 | Disposition: A | Discharge status: Home / Self Care | Payer: Medicare Other | Source: Ambulatory Visit | Attending: Thoracic Surgery | Admitting: Thoracic Surgery

## 2010-12-27 ENCOUNTER — Ambulatory Visit (INDEPENDENT_AMBULATORY_CARE_PROVIDER_SITE_OTHER): Payer: Medicare Other | Admitting: Thoracic Surgery

## 2010-12-27 DIAGNOSIS — C3410 Malignant neoplasm of upper lobe, unspecified bronchus or lung: Secondary | ICD-10-CM

## 2010-12-27 DIAGNOSIS — C3490 Malignant neoplasm of unspecified part of unspecified bronchus or lung: Secondary | ICD-10-CM

## 2010-12-28 NOTE — Assessment & Plan Note (Signed)
OFFICE VISIT  ANGLINE, SCHWEIGERT E DOB:  1931-01-31                                        December 27, 2010 CHART #:  [id number]  Ms. Kilty returned today.  Her blood pressure is 124/64, pulse 58, sats were 95%.  She is doing stable.  She finished 5 treatments of SBRT with the last one on Oct 24, 2010.  Chest x-ray today shows reduction in the right upper lobe spot.  She will see Dr. Kathrynn Running in a month; and I think we will probably get a CT scan at that time.  I will see her again in 3 months with a chest x-ray.  If she had not had a CT scan at that time, we will order one.  [name], M.D. Electronically Signed  DPB/MEDQ  D:  12/27/2010  T:  12/28/2010  Job:  [id number]  cc:   Samul Dada, M.D. Oneita Hurt, M.D.

## 2011-01-30 ENCOUNTER — Encounter (HOSPITAL_COMMUNITY): Payer: Self-pay

## 2011-01-30 ENCOUNTER — Ambulatory Visit (HOSPITAL_COMMUNITY)
Admission: RE | Admit: 2011-01-30 | Discharge: 2011-01-30 | Disposition: A | Discharge status: Home / Self Care | Payer: Medicare Other | Source: Ambulatory Visit | Attending: Radiation Oncology | Admitting: Radiation Oncology

## 2011-01-30 DIAGNOSIS — C3490 Malignant neoplasm of unspecified part of unspecified bronchus or lung: Secondary | ICD-10-CM | POA: Insufficient documentation

## 2011-01-30 DIAGNOSIS — D3500 Benign neoplasm of unspecified adrenal gland: Secondary | ICD-10-CM | POA: Insufficient documentation

## 2011-01-30 DIAGNOSIS — Z923 Personal history of irradiation: Secondary | ICD-10-CM | POA: Insufficient documentation

## 2011-01-30 DIAGNOSIS — Z9221 Personal history of antineoplastic chemotherapy: Secondary | ICD-10-CM | POA: Insufficient documentation

## 2011-01-30 HISTORY — DX: Malignant (primary) neoplasm, unspecified: C80.1

## 2011-02-01 ENCOUNTER — Ambulatory Visit
Admission: RE | Admit: 2011-02-01 | Discharge: 2011-02-01 | Disposition: A | Discharge status: Home / Self Care | Payer: Medicare Other | Source: Ambulatory Visit | Attending: Radiation Oncology | Admitting: Radiation Oncology

## 2011-02-02 ENCOUNTER — Other Ambulatory Visit: Payer: Self-pay | Admitting: Radiation Oncology

## 2011-02-02 DIAGNOSIS — C3490 Malignant neoplasm of unspecified part of unspecified bronchus or lung: Secondary | ICD-10-CM

## 2011-02-22 ENCOUNTER — Inpatient Hospital Stay (HOSPITAL_COMMUNITY)
Admission: EM | Admit: 2011-02-22 | Discharge: 2011-02-27 | DRG: 387 | Disposition: A | Discharge status: Home / Self Care | Payer: Medicare Other | Source: Ambulatory Visit | Attending: Family Medicine | Admitting: Family Medicine

## 2011-02-22 ENCOUNTER — Emergency Department (HOSPITAL_COMMUNITY): Payer: Medicare Other

## 2011-02-22 ENCOUNTER — Encounter (HOSPITAL_COMMUNITY): Payer: Self-pay | Admitting: Radiology

## 2011-02-22 DIAGNOSIS — Z85118 Personal history of other malignant neoplasm of bronchus and lung: Secondary | ICD-10-CM

## 2011-02-22 DIAGNOSIS — Z833 Family history of diabetes mellitus: Secondary | ICD-10-CM

## 2011-02-22 DIAGNOSIS — E785 Hyperlipidemia, unspecified: Secondary | ICD-10-CM | POA: Diagnosis present

## 2011-02-22 DIAGNOSIS — Z79899 Other long term (current) drug therapy: Secondary | ICD-10-CM

## 2011-02-22 DIAGNOSIS — Z86711 Personal history of pulmonary embolism: Secondary | ICD-10-CM

## 2011-02-22 DIAGNOSIS — I10 Essential (primary) hypertension: Secondary | ICD-10-CM | POA: Diagnosis present

## 2011-02-22 DIAGNOSIS — K219 Gastro-esophageal reflux disease without esophagitis: Secondary | ICD-10-CM | POA: Diagnosis present

## 2011-02-22 DIAGNOSIS — Z87891 Personal history of nicotine dependence: Secondary | ICD-10-CM

## 2011-02-22 DIAGNOSIS — E669 Obesity, unspecified: Secondary | ICD-10-CM | POA: Diagnosis present

## 2011-02-22 DIAGNOSIS — K5100 Ulcerative (chronic) pancolitis without complications: Principal | ICD-10-CM | POA: Diagnosis present

## 2011-02-22 DIAGNOSIS — Z860100 Personal history of colon polyps, unspecified: Secondary | ICD-10-CM

## 2011-02-22 DIAGNOSIS — Z8249 Family history of ischemic heart disease and other diseases of the circulatory system: Secondary | ICD-10-CM

## 2011-02-22 DIAGNOSIS — G8929 Other chronic pain: Secondary | ICD-10-CM | POA: Diagnosis present

## 2011-02-22 DIAGNOSIS — I739 Peripheral vascular disease, unspecified: Secondary | ICD-10-CM | POA: Diagnosis present

## 2011-02-22 DIAGNOSIS — D649 Anemia, unspecified: Secondary | ICD-10-CM | POA: Diagnosis present

## 2011-02-22 DIAGNOSIS — Z923 Personal history of irradiation: Secondary | ICD-10-CM

## 2011-02-22 DIAGNOSIS — E119 Type 2 diabetes mellitus without complications: Secondary | ICD-10-CM | POA: Diagnosis present

## 2011-02-22 HISTORY — DX: Heart failure, unspecified: I50.9

## 2011-02-22 HISTORY — DX: Essential (primary) hypertension: I10

## 2011-02-22 LAB — GLUCOSE, CAPILLARY: Glucose-Capillary: 98 mg/dL (ref 70–99)

## 2011-02-22 LAB — COMPREHENSIVE METABOLIC PANEL
ALT: 17 U/L (ref 0–35)
Alkaline Phosphatase: 57 U/L (ref 39–117)
CO2: 27 mEq/L (ref 19–32)
Chloride: 101 mEq/L (ref 96–112)
GFR calc Af Amer: 55 mL/min — ABNORMAL LOW (ref >60)
Glucose, Bld: 107 mg/dL — ABNORMAL HIGH (ref 70–99)
Potassium: 3.6 mEq/L (ref 3.5–5.1)
Sodium: 139 mEq/L (ref 135–145)
Total Protein: 7.6 g/dL (ref 6.0–8.3)

## 2011-02-22 LAB — DIFFERENTIAL
Basophils Absolute: 0 10*3/uL (ref 0.0–0.1)
Basophils Relative: 0 % (ref 0–1)
Eosinophils Absolute: 0.2 10*3/uL (ref 0.0–0.7)
Eosinophils Relative: 3 % (ref 0–5)
Neutrophils Relative %: 63 % (ref 43–77)

## 2011-02-22 LAB — URINALYSIS, ROUTINE W REFLEX MICROSCOPIC
Bilirubin Urine: NEGATIVE
Specific Gravity, Urine: 1.014 (ref 1.005–1.030)
Urobilinogen, UA: 0.2 mg/dL (ref 0.0–1.0)

## 2011-02-22 LAB — CBC
Platelets: 215 10*3/uL (ref 150–400)
RDW: 13.9 % (ref 11.5–15.5)
WBC: 8.2 10*3/uL (ref 4.0–10.5)

## 2011-02-22 LAB — PROTIME-INR: Prothrombin Time: 26.8 seconds — ABNORMAL HIGH (ref 11.6–15.2)

## 2011-02-22 LAB — CROSSMATCH: Antibody Screen: NEGATIVE

## 2011-02-22 LAB — POCT I-STAT TROPONIN I: Troponin i, poc: 0 ng/mL (ref 0.00–0.08)

## 2011-02-22 LAB — URINE MICROSCOPIC-ADD ON

## 2011-02-22 MED ORDER — IOHEXOL 300 MG/ML  SOLN
100.0000 mL | Freq: Once | INTRAMUSCULAR | Status: AC | PRN
  Administered 2011-02-22: 100 mL via INTRAVENOUS

## 2011-02-23 DIAGNOSIS — K625 Hemorrhage of anus and rectum: Secondary | ICD-10-CM

## 2011-02-23 DIAGNOSIS — A09 Infectious gastroenteritis and colitis, unspecified: Secondary | ICD-10-CM

## 2011-02-23 DIAGNOSIS — Z7901 Long term (current) use of anticoagulants: Secondary | ICD-10-CM

## 2011-02-23 LAB — CBC
MCH: 30.6 pg (ref 26.0–34.0)
MCHC: 33.5 g/dL (ref 30.0–36.0)
Platelets: 205 10*3/uL (ref 150–400)
Platelets: 212 10*3/uL (ref 150–400)
RDW: 13.9 % (ref 11.5–15.5)
WBC: 6.9 10*3/uL (ref 4.0–10.5)

## 2011-02-23 LAB — BASIC METABOLIC PANEL
BUN: 20 mg/dL (ref 6–23)
Creatinine, Ser: 0.78 mg/dL (ref 0.50–1.10)
GFR calc Af Amer: >60 mL/min (ref >60)
GFR calc non Af Amer: >60 mL/min (ref >60)

## 2011-02-23 LAB — GLUCOSE, CAPILLARY
Glucose-Capillary: 93 mg/dL (ref 70–99)
Glucose-Capillary: 144 mg/dL — ABNORMAL HIGH (ref 70–99)

## 2011-02-23 LAB — HEMOGLOBIN A1C: Hgb A1c MFr Bld: 6.4 % — ABNORMAL HIGH (ref <5.7)

## 2011-02-23 LAB — PROTIME-INR: Prothrombin Time: 25.3 seconds — ABNORMAL HIGH (ref 11.6–15.2)

## 2011-02-24 ENCOUNTER — Other Ambulatory Visit: Payer: Self-pay | Admitting: Internal Medicine

## 2011-02-24 DIAGNOSIS — D689 Coagulation defect, unspecified: Secondary | ICD-10-CM

## 2011-02-24 DIAGNOSIS — K5289 Other specified noninfective gastroenteritis and colitis: Secondary | ICD-10-CM

## 2011-02-24 DIAGNOSIS — K625 Hemorrhage of anus and rectum: Secondary | ICD-10-CM

## 2011-02-24 LAB — CBC
HCT: 38.2 % (ref 36.0–46.0)
MCH: 31 pg (ref 26.0–34.0)
MCHC: 34.3 g/dL (ref 30.0–36.0)
RDW: 13.8 % (ref 11.5–15.5)

## 2011-02-24 LAB — BASIC METABOLIC PANEL
Calcium: 9.2 mg/dL (ref 8.4–10.5)
GFR calc Af Amer: >60 mL/min (ref >60)
GFR calc non Af Amer: >60 mL/min (ref >60)
Potassium: 3.5 mEq/L (ref 3.5–5.1)
Sodium: 142 mEq/L (ref 135–145)

## 2011-02-24 LAB — PROTIME-INR
INR: 1.65 — ABNORMAL HIGH (ref 0.00–1.49)
Prothrombin Time: 19.8 seconds — ABNORMAL HIGH (ref 11.6–15.2)

## 2011-02-24 LAB — PREPARE FRESH FROZEN PLASMA
Unit division: 0
Unit division: 0

## 2011-02-24 LAB — GLUCOSE, CAPILLARY: Glucose-Capillary: 100 mg/dL — ABNORMAL HIGH (ref 70–99)

## 2011-02-25 DIAGNOSIS — R197 Diarrhea, unspecified: Secondary | ICD-10-CM

## 2011-02-25 DIAGNOSIS — K5289 Other specified noninfective gastroenteritis and colitis: Secondary | ICD-10-CM

## 2011-02-25 LAB — CBC
MCV: 90.4 fL (ref 78.0–100.0)
Platelets: 225 10*3/uL (ref 150–400)
RBC: 4.15 MIL/uL (ref 3.87–5.11)
RDW: 13.8 % (ref 11.5–15.5)
WBC: 7.4 10*3/uL (ref 4.0–10.5)

## 2011-02-25 LAB — PROTIME-INR
INR: 1.35 (ref 0.00–1.49)
Prothrombin Time: 16.9 seconds — ABNORMAL HIGH (ref 11.6–15.2)

## 2011-02-25 LAB — BASIC METABOLIC PANEL
CO2: 28 mEq/L (ref 19–32)
Calcium: 9.2 mg/dL (ref 8.4–10.5)
Chloride: 102 mEq/L (ref 96–112)
Creatinine, Ser: 0.68 mg/dL (ref 0.50–1.10)
GFR calc Af Amer: >60 mL/min (ref >60)
Sodium: 137 mEq/L (ref 135–145)

## 2011-02-25 LAB — GLUCOSE, CAPILLARY
Glucose-Capillary: 141 mg/dL — ABNORMAL HIGH (ref 70–99)
Glucose-Capillary: 161 mg/dL — ABNORMAL HIGH (ref 70–99)
Glucose-Capillary: 120 mg/dL — ABNORMAL HIGH (ref 70–99)

## 2011-02-26 ENCOUNTER — Encounter: Payer: Self-pay | Admitting: Internal Medicine

## 2011-02-26 LAB — CBC
HCT: 38.5 % (ref 36.0–46.0)
Hemoglobin: 13.1 g/dL (ref 12.0–15.0)
RBC: 4.27 MIL/uL (ref 3.87–5.11)

## 2011-02-26 LAB — BASIC METABOLIC PANEL
Chloride: 100 mEq/L (ref 96–112)
GFR calc Af Amer: >60 mL/min (ref >60)
GFR calc non Af Amer: >60 mL/min (ref >60)
Potassium: 3.3 mEq/L — ABNORMAL LOW (ref 3.5–5.1)
Sodium: 138 mEq/L (ref 135–145)

## 2011-02-26 LAB — GLUCOSE, CAPILLARY
Glucose-Capillary: 124 mg/dL — ABNORMAL HIGH (ref 70–99)
Glucose-Capillary: 124 mg/dL — ABNORMAL HIGH (ref 70–99)
Glucose-Capillary: 152 mg/dL — ABNORMAL HIGH (ref 70–99)

## 2011-02-27 LAB — CBC
HCT: 42.9 % (ref 36.0–46.0)
Hemoglobin: 14.8 g/dL (ref 12.0–15.0)
MCH: 31.3 pg (ref 26.0–34.0)
MCV: 90.7 fL (ref 78.0–100.0)
RBC: 4.73 MIL/uL (ref 3.87–5.11)

## 2011-02-27 LAB — BASIC METABOLIC PANEL
BUN: 16 mg/dL (ref 6–23)
CO2: 30 mEq/L (ref 19–32)
Calcium: 10.1 mg/dL (ref 8.4–10.5)
Glucose, Bld: 117 mg/dL — ABNORMAL HIGH (ref 70–99)
Sodium: 139 mEq/L (ref 135–145)

## 2011-02-27 LAB — GLUCOSE, CAPILLARY: Glucose-Capillary: 134 mg/dL — ABNORMAL HIGH (ref 70–99)

## 2011-03-06 LAB — COMPREHENSIVE METABOLIC PANEL
AST: 20
Albumin: 4
Alkaline Phosphatase: 66
Chloride: 104
Creatinine, Ser: 0.79
GFR calc Af Amer: >60
Potassium: 4.8
Total Bilirubin: 0.3
Total Protein: 6.4

## 2011-03-06 LAB — PROTIME-INR: INR: 1

## 2011-03-06 LAB — URINALYSIS, ROUTINE W REFLEX MICROSCOPIC
Bilirubin Urine: NEGATIVE
Glucose, UA: NEGATIVE
Ketones, ur: NEGATIVE
Ketones, ur: NEGATIVE
Leukocytes, UA: NEGATIVE
Nitrite: NEGATIVE
Nitrite: NEGATIVE
Protein, ur: NEGATIVE
Specific Gravity, Urine: 1.01
Urobilinogen, UA: 0.2
pH: 6

## 2011-03-06 LAB — DIFFERENTIAL
Basophils Absolute: 0
Eosinophils Relative: 2
Lymphocytes Relative: 35
Monocytes Absolute: 0.8
Monocytes Relative: 8

## 2011-03-06 LAB — TYPE AND SCREEN
ABO/RH(D): O POS
Antibody Screen: NEGATIVE

## 2011-03-06 LAB — CROSSMATCH
ABO/RH(D): O POS
Antibody Screen: NEGATIVE

## 2011-03-06 LAB — CBC
Platelets: 240
WBC: 9.4

## 2011-03-06 LAB — POCT I-STAT 4, (NA,K, GLUC, HGB,HCT)
Glucose, Bld: 113 — ABNORMAL HIGH
Glucose, Bld: 124 — ABNORMAL HIGH
Operator id: 119851
Operator id: 119851
Potassium: 4.1

## 2011-03-06 LAB — BASIC METABOLIC PANEL
CO2: 26
Chloride: 101
Chloride: 103
GFR calc non Af Amer: >60
GFR calc non Af Amer: >60
Glucose, Bld: 152 — ABNORMAL HIGH
Potassium: 3.4 — ABNORMAL LOW
Potassium: 3.5
Sodium: 135
Sodium: 137

## 2011-03-06 LAB — HEMOGLOBIN AND HEMATOCRIT, BLOOD
HCT: 25.2 — ABNORMAL LOW
HCT: 30.5 — ABNORMAL LOW
Hemoglobin: 10.4 — ABNORMAL LOW
Hemoglobin: 8.5 — ABNORMAL LOW
Hemoglobin: 9.5 — ABNORMAL LOW

## 2011-03-08 LAB — DIFFERENTIAL
Basophils Absolute: 0
Eosinophils Absolute: 0.3
Eosinophils Relative: 3
Lymphocytes Relative: 29
Monocytes Absolute: 0.7

## 2011-03-08 LAB — POCT I-STAT, CHEM 8
BUN: 12
Calcium, Ion: 1.15
Creatinine, Ser: 0.9
Hemoglobin: 15.3 — ABNORMAL HIGH
TCO2: 24

## 2011-03-08 LAB — CBC
HCT: 43.5
Hemoglobin: 14.5
MCV: 84.9
RDW: 15.5

## 2011-03-08 LAB — URINALYSIS, ROUTINE W REFLEX MICROSCOPIC
Glucose, UA: NEGATIVE
Hgb urine dipstick: NEGATIVE
Ketones, ur: NEGATIVE
Protein, ur: NEGATIVE

## 2011-03-08 LAB — POCT CARDIAC MARKERS
CKMB, poc: 1.1
Myoglobin, poc: 62.8

## 2011-03-08 NOTE — H&P (Signed)
[name], [name]                ACCOUNT NO.:  [id number]  MEDICAL RECORD NO.:  [id number]  LOCATION:  [facility]                         FACILITY:  [facility]  PHYSICIAN:  Paula Compton, MD        DATE OF BIRTH:  26-Jan-1931  DATE OF ADMISSION:  02/22/2011 DATE OF DISCHARGE:                             HISTORY & PHYSICAL   CHIEF COMPLAINT:  Rectal bleeding.  HISTORY OF PRESENT ILLNESS:  Ms. [name] presents to the emergency department, complaining of rectal bleeding for 2 days.  She had had diarrhea for about a week and then on Tuesday, she began having bright red blood per rectum.  She says that it was a lot of blood and she was having to wear menstrual pad for the bleeding.  This morning, she called her primary care doctor who told her to come to the ED instead of coming in to the office.  The patient endorses some nausea, but no vomiting. She does complain of weakness and a little dizziness, but no fevers, denies chills, denies chest pain, denies dyspnea, denies lower extremity edema.  REVIEW OF SYSTEMS:  Negative except as stated in HPI.  PAST MEDICAL HISTORY:  The patient has history of lung cancer, history of pulmonary embolus on Coumadin, history of congestive heart failure, history of COPD, history of hypertension, history of hyperlipidemia, history of GERD, diabetes mellitus type 2, diet controlled.  MEDICATIONS: 1. Warfarin 3 mg p.o. daily. 2. Celebrex 200 mg p.o. at bedtime. 3. Fenofibrate 134 mg p.o. daily. 4. Senokot 8.6 mg p.o. daily p.r.n. 5. Simvastatin 80 mg p.o. daily. 6. Zantac 75 mg p.o. daily. 7. Amlodipine/benazepril 50/10 p.o. daily. 8. Hydrocodone/APAP 7.5/650 p.o. q.4 p.r.n.  ALLERGIES:  ASPIRIN.  FAMILY HISTORY:  Positive for coronary artery disease, hypertension, hyperlipidemia, and diabetes.  SOCIAL HISTORY:  The patient is a former smoker, 50 pack years.  She lives with her daughter.  She denies alcohol or drug use.  PHYSICAL EXAMINATION:  VITAL  SIGNS:  Temperature 98.4, heart rate 93, respiratory rate 18, blood pressure 155/60, pulse ox 96% on room air. GENERAL:  The patient is alert, in no distress. HEAD, EYES, EARS, NOSE, AND THROAT:  Extraocular movements are intact. Oral mucosa is moist.  No lesions. CARDIOVASCULAR:  Regular rate and rhythm.  No murmurs, rubs, or gallops. LUNGS:  Clear to auscultation bilaterally. ABDOMEN:  Positive for some diffuse tenderness to palpation.  Negative for rebound tenderness.  Abdomen is soft.  Positive bowel sounds. RECTAL:  The patient has tenderness with exam.  She has no internal hemorrhoids or fissures.  Positive for external hemorrhoids. EXTREMITIES:  2+ pulses.  No edema. SKIN:  No rashes or lesions. NEUROLOGIC:  Grossly normal.  LABS AND STUDIES:  CBC with differential; white blood cell count 8.2, hemoglobin 15.1, hematocrit 43.2, platelets are 215, neutrophils 63%, lymphocytes 18%, monocytes 17%, eosinophils 3%.  Complete metabolic panel; sodium 139, potassium 3.6, chloride 101, CO2 27, BUN 31, creatinine 1.16, glucose 107.  Total bilirubin 0.3, alk phos 57, AST 26, ALT 17, total protein is 7.6, albumin is 3.5, calcium is 9.5.  Lipase was 140.  Point-of-care troponin is 0.00.  INR is 2.43, ProTime  26.8. Fecal occult blood was positive.  Urinalysis showed trace blood and leukocytes.  Urine microscopic had many epithelial.  CT of the abdomen and pelvis showed no acute findings, hepatic steatosis.  ASSESSMENT AND PLAN:  Ms. [name] is a 77 year old female with multiple medical problems, on Coumadin for history of pulmonary embolus, who presents to the emergency department with rectal bleeding.  1. Hematology.  The patient has a hemoglobin of 15.  She is     hemodynamically stable.  No bright red blood per rectum since     arrival to the ED and no sign of hemodynamic instability.  We will     check CBCs serially to ensure that she remains stable.  We will     hold her Coumadin, we  will not reverse her INR at this time as     there is no sign of active bleeding. 2. Gastrointestinal.  Unclear source of the bright red blood per     rectum, possibly internal hemorrhoids that are too high to palpate     on exam versus a high-risk source of colon bleed.  We will monitor     the patient for further bleeding and plan to call Gastroenterology     in the morning as long as the patient remains stable.  If at any     time she becomes hemodynamically unstable, her hemoglobin is     dropping, or is having active bleeding, we will call     Gastroenterology.  The patient does have an elevated lipase though     she has minimal abdominal tenderness and she has no pancreas     abnormalities on her CT scan.  We will monitor her abdominal exam     for nausea, vomiting, and pain.  Also, we will give her Protonix     for gastrointestinal prophylaxis. 3. Cardiovascular.  We will admit the patient to a telemetry bed.  We     will obtain an EKG, point-of-care troponin in the ED was negative,     and she is not complaining of chest pain.  We will monitor for any     changes in that.  We will start her home hypertensive medications. 4. Endocrine.  The patient reports she has diet-controlled diabetes.     We will obtain a hemoglobin A1c and put her on sliding scale     insulin with q.a.c., at bedtime CBG checks. 5. Neurologic and pain.  The patient has some chronic pain from     orthopedic problems, so we will give her home dose of narcotics. 6. Fluids, electrolytes, and nutrition.  We will give the patient     maintenance IV fluids as her creatinine is mildly elevated.  We     will recheck that in the morning.  We will give the patient heart-     healthy, soft mechanical diet as she says she cannot put in her     lower dentures, but we will make her n.p.o. after midnight in case     of any need for procedure in the morning. 7. Deep vein thrombosis prophylaxis.  We will place SCDs due to      concern for gastrointestinal bleeding. 8. Code status.  The patient is full code. 9. Disposition is pending clinical improvement.    ______________________________ Ardyth Gal, MD   ______________________________ Paula Compton, MD    CR/MEDQ  D:  02/22/2011  T:  02/22/2011  Job:  [id number]  Electronically Signed  by Ardyth Gal MD on 02/26/2011 09:19:08 AM Electronically Signed by Paula Compton MD on 03/08/2011 03:02:55 PM

## 2011-03-12 DIAGNOSIS — Z86711 Personal history of pulmonary embolism: Secondary | ICD-10-CM

## 2011-03-12 HISTORY — DX: Personal history of pulmonary embolism: Z86.711

## 2011-03-16 NOTE — Consult Note (Signed)
NAMEWHITNI, PASQUINI                ACCOUNT NO.:  [id number]  MEDICAL RECORD NO.:  [id number]  LOCATION:  4705                         FACILITY:  [facility]  PHYSICIAN:  Jordan Hawks. Elnoria Howard, MD    DATE OF BIRTH:  1931/04/04  DATE OF CONSULTATION:  02/23/2011 DATE OF DISCHARGE:                                CONSULTATION   REASON FOR CONSULTATION:  Hematochezia and anemia.  This is a teaching service patient.  HISTORY OF PRESENT ILLNESS:  This is a 77 year old female with a past medical history of lung cancer and distant history of PE on Coumadin, CHF, peripheral vascular disease, hypertension, hyperlipidemia, gastroesophageal reflux disease, type 2 diabetes, and colonic polyps, who is admitted to the hospital with complaints of hematochezia.  The patient states that prior to her admission, she started having several days of diarrhea.  She denies having pain, fever, nausea, and vomiting, but subsequently she progressed to having hematochezia x2 days.  As a result of her symptoms, she presented to the emergency room for further evaluation and treatment.  During her hospitalization, after IV hydration her hemoglobin was noted to drop down from the 15 range to 12.8.  Overall, she is hemodynamically stable.  The patient denies taking any recent NSAIDs and she does have a history of colonic polyps. Apparently, she underwent colonoscopies over at [facility] per her report.  As a result of the symptoms, a GI consultation was requested for further evaluation and treatment.  PAST MEDICAL AND SURGICAL HISTORY:  As stated above.  FAMILY HISTORY:  Noncontributory.  SOCIAL HISTORY:  Positive for 50 pack-year of tobacco abuse.  No alcohol or illicit drug use.  MEDICATIONS: 1. __________ 10 mg 1 p.o. daily 2. Sliding-scale insulin. 3. Protonix 40 mg p.o. daily. 4. Robaxin 500 mg p.o. q.8h. p.r.n. 5. Zofran 4 mg IV q.6h. p.r.n.  ALLERGIES:  ASPIRIN.  PHYSICAL EXAMINATION:  VITAL SIGNS:   Blood pressure is 126/54, heart rate is 81, respirations 20, temperature is 98.3. GENERAL:  The patient is in no acute distress, alert and oriented. HEENT:  Normocephalic, atraumatic.  Extraocular muscles intact. NECK:.  Supple.  No lymphadenopathy. LUNGS:  Clear to auscultation bilaterally. CARDIOVASCULAR:  Regular rhythm. ABDOMEN:  Obese, soft, nontender, nondistended. EXTREMITIES:  No clubbing, cyanosis, or edema. RECTAL:  Deferred at this time as she is currently on the commode.  LABORATORY DATA:  White blood cell count 6.7, hemoglobin 12.8, MCV is 91.4, platelets at 212, INR 2.2.  Sodium was 140, potassium 2.8, chloride 103, CO2 of 27, glucose 92, BUN 20, creatinine 0.7.  IMPRESSION: 1. Hematochezia. 2. Heme-positive stool. 3. History of pulmonary embolism.  PLAN:  I discussed the case with the residents and it is uncertain about her chronic Coumadin use.  She did have pulmonary embolism in the distant past, but others thought that she may not require the Coumadin. Further evaluation with a colonoscopy is required at this time with thisbleeding.  Plan is for: 1. Colonoscopy tomorrow. 2. To reverse the Coumadin with FFP. 3. Further recommendations will be made pending the findings.     Jordan Hawks Elnoria Howard, MD     PDH/MEDQ  D:  02/23/2011  T:  02/23/2011  Job:  [id number]  Electronically Signed by Jeani Hawking MD on 03/16/2011 08:12:47 AM

## 2011-03-19 NOTE — Discharge Summary (Signed)
[name], [name]                ACCOUNT NO.:  [id number]  MEDICAL RECORD NO.:  [id number]  LOCATION:  4705                         FACILITY:  [facility]  PHYSICIAN:  Paula Compton, MD        DATE OF BIRTH:  [birth year]  DATE OF ADMISSION:  02/22/2011 DATE OF DISCHARGE:  02/27/2011                              DISCHARGE SUMMARY   PRIMARY CARE PROVIDER:  [name], NP at [facility] in [place].  DISCHARGE DIAGNOSES: 1. Rectal bleeding. 2. Hypertension. 3. Diabetes. 4. Chronic pain. 5. History of pulmonary embolism on chronic Coumadin.  DISCHARGE MEDICATIONS:  New medications on discharge: 1. Cipro 500 mg twice daily for 10 days. 2. Flagyl 500 mg 3 times a day for 10 days. 3. Asacol 400 mg 4 tablets 3 times per day.  Same medications from previous: 1. Alendronate 70 mg 1 tablet by mouth weekly. 2. Amlodipine/benazepril 5/10 mg 1 tablet by mouth daily. 3. Fenofibrate 134 mg 1 tablet by mouth daily. 4. Glucosamine 1 tablet by mouth daily. 5. Hydrocodone/APAP 7.5./650 mg 1 tablet by mouth every 4 hours as     needed. 6. Methocarbamol 500 mg 1 tablet by mouth every 8 hours as needed. 7. Senokot 8.6 mg 1-3 tablets by mouth daily as needed. 8. Simvastatin 80 mg 1 tablet by mouth daily at bedtime. 9. Zantac 75 mg 1 tablet by mouth every morning.  Medications to stop on discharge: 1. Celebrex 200 mg 1 capsule by mouth daily at bedtime. 2. Warfarin 3 mg 1 tablet by mouth every evening. 3. Tylenol 650 mg 1 tablet by mouth every 6 hours as needed. 4. Tylenol 1-2 tablets by mouth daily at bedtime as needed.  CONSULT:  Gastroenterology consulted on February 23, 2011, Jordan Hawks. Elnoria Howard, MD.  PROCEDURES: 1. CT abdomen showing no evidence of inflammatory process or abnormal     fluid collections.  On a positive side, bowel shows no definite     evidence of wall thickening or dilatation.  Overall, no acute     findings.  Hepatic steatosis. 2. Colonoscopy on February 24, 2011,  showed nonspecific pancolitis     with granularity throughout the colon and the rectum.  Rule out     ulcerative colitis.  Biopsies taken from the left colon, the     rectosigmoid, and random biopsies.  LABORATORY DATA:  CBC on admission; WBC 8.2, hemoglobin 15.1, hematocrit 43.2, platelet 215 with elevated monocyte.  CMET on admission; sodium 139, potassium 3.6, chloride 101, CO2 of 127, glucose 107, BUN 31, creatinine 1.16.  Bilirubin 0.3, alk phos 57, AST 26, ALT 17, total protein 7.6, albumin 3.5, calcium 9.5.  Lactic acid normal at 0.8. Lipase on February 22, 2011, 140; repeat lipase 115.  PT 26.8, INR 2.43 on admission.  Hemoglobin A1c 6.4.  Urinalysis; urine showed trace blood and trace leukocyte with many squamous epithelial cells on micro. Discharge CBC; WBC 11.6, hemoglobin 14.8, hematocrit 42.9, platelets 256.  BMET; sodium 139, potassium 3.9, chloride 99, CO2 of 30, glucose 117, BUN 16, creatinine 0.85, calcium 10.1.  PT/INR on discharge; PT 16.9, INR 1.35.  BRIEF HOSPITAL COURSE:  A 77 year old female with history of lung  cancer status post resection and radiation on chronic Coumadin due to PE in the past, also history of hypertension, diet-controlled diabetes, and chronic pain, who presented with diarrhea and rectal bleeding. 1. Rectal bleeding.  The patient was hemodynamically stable on     admission.  Her hemoglobin was normal at 15.1, but did trend down     to 13 the next day.  This was thought to be dilutional.  With her     complaint of hematochezia not improving overnight with diarrhea and     an elevated lipase, GI was consulted.  Colonoscopy was done that     showed pancolitis.  The differential included ulcerative colitis     versus infectious process.  Awaiting for the biopsy results, the     patient was started on Asacol 1200 mg by mouth twice a day and     Canasa 1000 mg per rectum twice a day.  The patient did feel that     there was some improvement,  although she still did have some     hematochezia.  The patient did not complain of any frank abdominal     pain, only mild soreness.  Biopsy results showed active colitis and     no evidence of viral cytopathic changes.  No evidence of dysplasia,     adenomatous changes, or malignancy in a random colon biopsy, the     left colon biopsy, and the rectosigmoid colon biopsy.  The main     consideration per the pathologist in the setting included early     onset of inflammatory bowel disease, post-treatment colitis, or     acute infectious colitis.  With an infectious process still not     excluded, Dr. Elnoria Howard recommended to discharge the patient on Cipro     and Flagyl for 10 days.  He also recommended to continue the Asacol     400 mg 4 tablets 3 times a day.  The patient was discharged     hemodynamically stable with a hemoglobin of 14.8.  The patient     still had some hematochezia on discharge. 2. Hypertension.  The patient on admission had a blood pressure of     155/60.  She was started back on her home blood pressure medicines,     Norvasc 5 and benazepril 10.  Blood pressures ranged between 140s     systolic to 110s systolic.  Blood pressure on discharge was 147/64.     The patient was discharged on her home medicines and home dosages. 3. Diabetes.  The patient's CBG ranged between 93 and 165.  The     patient never required any insulin, sliding scale.  The patient's     hemoglobin A1c was 6.4.  The patient was discharged with     recommendation of a diabetic diet. 4. Chronic pain from chronic orthopedic problems.  The patient was     kept on her home dose of Percocet 7.5/650 with good pain control. 5. History of pulmonary embolism on chronic Coumadin.  The patient was     initially admitted with the INR of 2.43.  When speaking to the     patient, it appears that she developed pulmonary embolism a few     years ago right before being diagnosed with lung cancer.  In order     to  undergo colonoscopy, the patient received fresh frozen plasma     for Coumadin reversal.  Her INR was 1.65 at  that point and was the     next day 1.35.  Since the patient's pulmonary embolus was directly     associated with a diagnosis of lung cancer and known     hypercoagulable state.  It was decided to not start her back on her     Coumadin on discharge.  DISCHARGE INSTRUCTIONS:  The patient was instructed to continue with diabetic diet and to return to clinic or to the hospital if severe abdominal pain and dizziness and lightheadedness from bleeding.  THINGS TO FOLLOW UP ON:  Hypertension.  No adjustments to her home medicines were made and blood pressure was in the high 140s systolic.  FOLLOWUP APPOINTMENTS: 1. The patient was to make an appointment with her PCP in a week. 2. The patient was given an appointment to see Dr. Elnoria Howard in clinic on     March 07, 2011, at 10 a.m.  DISCHARGE CONDITION:  The patient was discharged home in stable medical condition.    ______________________________ Marena Chancy, MD   ______________________________ Paula Compton, MD    SL/MEDQ  D:  02/28/2011  T:  03/01/2011  Job:  [id number]  cc:   [name], NP  Electronically Signed by Marena Chancy MD on 03/15/2011 02:45:05 AM Electronically Signed by Paula Compton MD on 03/19/2011 01:55:21 PM

## 2011-03-27 ENCOUNTER — Inpatient Hospital Stay (HOSPITAL_COMMUNITY)
Admission: EM | Admit: 2011-03-27 | Discharge: 2011-04-03 | DRG: 175 | Disposition: A | Discharge status: Skilled Nursing Facility | Payer: Medicare Other | Source: Other Acute Inpatient Hospital | Attending: Internal Medicine | Admitting: Internal Medicine

## 2011-03-27 DIAGNOSIS — I509 Heart failure, unspecified: Secondary | ICD-10-CM | POA: Diagnosis present

## 2011-03-27 DIAGNOSIS — J9620 Acute and chronic respiratory failure, unspecified whether with hypoxia or hypercapnia: Secondary | ICD-10-CM | POA: Diagnosis present

## 2011-03-27 DIAGNOSIS — J449 Chronic obstructive pulmonary disease, unspecified: Secondary | ICD-10-CM | POA: Diagnosis present

## 2011-03-27 DIAGNOSIS — Z85118 Personal history of other malignant neoplasm of bronchus and lung: Secondary | ICD-10-CM

## 2011-03-27 DIAGNOSIS — I2699 Other pulmonary embolism without acute cor pulmonale: Principal | ICD-10-CM | POA: Diagnosis present

## 2011-03-27 DIAGNOSIS — Z833 Family history of diabetes mellitus: Secondary | ICD-10-CM

## 2011-03-27 DIAGNOSIS — I10 Essential (primary) hypertension: Secondary | ICD-10-CM | POA: Diagnosis present

## 2011-03-27 DIAGNOSIS — N179 Acute kidney failure, unspecified: Secondary | ICD-10-CM | POA: Diagnosis present

## 2011-03-27 DIAGNOSIS — I5033 Acute on chronic diastolic (congestive) heart failure: Secondary | ICD-10-CM | POA: Diagnosis present

## 2011-03-27 DIAGNOSIS — J4489 Other specified chronic obstructive pulmonary disease: Secondary | ICD-10-CM | POA: Diagnosis present

## 2011-03-27 DIAGNOSIS — C3430 Malignant neoplasm of lower lobe, unspecified bronchus or lung: Secondary | ICD-10-CM

## 2011-03-27 DIAGNOSIS — D72829 Elevated white blood cell count, unspecified: Secondary | ICD-10-CM | POA: Diagnosis present

## 2011-03-27 DIAGNOSIS — J438 Other emphysema: Secondary | ICD-10-CM

## 2011-03-27 DIAGNOSIS — I82409 Acute embolism and thrombosis of unspecified deep veins of unspecified lower extremity: Secondary | ICD-10-CM | POA: Diagnosis present

## 2011-03-27 DIAGNOSIS — E119 Type 2 diabetes mellitus without complications: Secondary | ICD-10-CM | POA: Diagnosis present

## 2011-03-27 LAB — GLUCOSE, CAPILLARY
Glucose-Capillary: 218 mg/dL — ABNORMAL HIGH (ref 70–99)
Glucose-Capillary: 133 mg/dL — ABNORMAL HIGH (ref 70–99)
Glucose-Capillary: 125 mg/dL — ABNORMAL HIGH (ref 70–99)

## 2011-03-27 LAB — MAGNESIUM: Magnesium: 1.8 mg/dL (ref 1.5–2.5)

## 2011-03-27 LAB — DIFFERENTIAL
Basophils Absolute: 0 10*3/uL (ref 0.0–0.1)
Basophils Relative: 0 % (ref 0–1)
Lymphocytes Relative: 13 % (ref 12–46)
Monocytes Absolute: 0.3 10*3/uL (ref 0.1–1.0)
Neutro Abs: 6.5 10*3/uL (ref 1.7–7.7)

## 2011-03-27 LAB — CBC
HCT: 43.4 % (ref 36.0–46.0)
Hemoglobin: 14 g/dL (ref 12.0–15.0)
MCHC: 32.3 g/dL (ref 30.0–36.0)
RDW: 14 % (ref 11.5–15.5)
WBC: 7.8 10*3/uL (ref 4.0–10.5)

## 2011-03-27 LAB — COMPREHENSIVE METABOLIC PANEL
AST: 34 U/L (ref 0–37)
Albumin: 4 g/dL (ref 3.5–5.2)
BUN: 25 mg/dL — ABNORMAL HIGH (ref 6–23)
Chloride: 101 mEq/L (ref 96–112)
Creatinine, Ser: 1.38 mg/dL — ABNORMAL HIGH (ref 0.50–1.10)
Total Bilirubin: 0.2 mg/dL — ABNORMAL LOW (ref 0.3–1.2)
Total Protein: 7.3 g/dL (ref 6.0–8.3)

## 2011-03-27 LAB — PROTIME-INR
INR: 1.04 (ref 0.00–1.49)
Prothrombin Time: 13.8 seconds (ref 11.6–15.2)

## 2011-03-27 LAB — URINALYSIS, ROUTINE W REFLEX MICROSCOPIC
Bilirubin Urine: NEGATIVE
Ketones, ur: NEGATIVE mg/dL
Nitrite: NEGATIVE
Protein, ur: NEGATIVE mg/dL
Urobilinogen, UA: 0.2 mg/dL (ref 0.0–1.0)

## 2011-03-27 LAB — PHOSPHORUS: Phosphorus: 3.1 mg/dL (ref 2.3–4.6)

## 2011-03-27 LAB — MRSA PCR SCREENING: MRSA by PCR: NEGATIVE

## 2011-03-27 LAB — APTT: aPTT: 33 seconds (ref 24–37)

## 2011-03-27 LAB — URINE MICROSCOPIC-ADD ON

## 2011-03-28 ENCOUNTER — Inpatient Hospital Stay (HOSPITAL_COMMUNITY): Payer: Medicare Other

## 2011-03-28 DIAGNOSIS — R0902 Hypoxemia: Secondary | ICD-10-CM

## 2011-03-28 LAB — HEMOGLOBIN AND HEMATOCRIT, BLOOD
HCT: 30.1 — ABNORMAL LOW
HCT: 33.9 — ABNORMAL LOW
Hemoglobin: 10.1 — ABNORMAL LOW

## 2011-03-28 LAB — PROTIME-INR
INR: 1.3
INR: 1.6 — ABNORMAL HIGH
INR: 1.6 — ABNORMAL HIGH
INR: 2 — ABNORMAL HIGH
Prothrombin Time: 14 seconds (ref 11.6–15.2)
Prothrombin Time: 15.3 — ABNORMAL HIGH
Prothrombin Time: 16.1 — ABNORMAL HIGH
Prothrombin Time: 19.5 — ABNORMAL HIGH
Prothrombin Time: 20 — ABNORMAL HIGH
Prothrombin Time: 23.2 — ABNORMAL HIGH

## 2011-03-28 LAB — POCT I-STAT 4, (NA,K, GLUC, HGB,HCT)
HCT: 23 — ABNORMAL LOW
Operator id: 198871
Potassium: 3.7
Sodium: 133 — ABNORMAL LOW

## 2011-03-28 LAB — TYPE AND SCREEN

## 2011-03-28 LAB — CBC
HCT: 45.1 % (ref 36.0–46.0)
HCT: 29.4 — ABNORMAL LOW
Hemoglobin: 14.6 g/dL (ref 12.0–15.0)
MCH: 30.1 pg (ref 26.0–34.0)
MCV: 88.1
RBC: 4.85 MIL/uL (ref 3.87–5.11)
RBC: 3.34 — ABNORMAL LOW
WBC: 14.4 — ABNORMAL HIGH

## 2011-03-28 LAB — URINALYSIS, ROUTINE W REFLEX MICROSCOPIC
Bilirubin Urine: NEGATIVE
Bilirubin Urine: NEGATIVE
Glucose, UA: NEGATIVE
Glucose, UA: NEGATIVE mg/dL
Hgb urine dipstick: NEGATIVE
Ketones, ur: NEGATIVE
Ketones, ur: NEGATIVE mg/dL
Leukocytes, UA: NEGATIVE
Nitrite: NEGATIVE
Protein, ur: NEGATIVE
Specific Gravity, Urine: 1.016 (ref 1.005–1.030)
pH: 5.5
pH: 5.5 (ref 5.0–8.0)

## 2011-03-28 LAB — URINE CULTURE
Colony Count: NO GROWTH
Culture  Setup Time: 201210160926
Special Requests: NEGATIVE

## 2011-03-28 LAB — DIFFERENTIAL
Basophils Relative: 0 % (ref 0–1)
Eosinophils Absolute: 0.3 10*3/uL (ref 0.0–0.7)
Lymphocytes Relative: 13 % (ref 12–46)
Monocytes Relative: 6 % (ref 3–12)
Neutrophils Relative %: 79 % — ABNORMAL HIGH (ref 43–77)

## 2011-03-28 LAB — BASIC METABOLIC PANEL
BUN: 3 — ABNORMAL LOW
BUN: 8
CO2: 28
CO2: 29
Calcium: 7.9 — ABNORMAL LOW
Calcium: 8.2 — ABNORMAL LOW
Chloride: 106
Creatinine, Ser: 0.7
GFR calc Af Amer: >60
GFR calc Af Amer: >60
GFR calc non Af Amer: >60
GFR calc non Af Amer: >60
Glucose, Bld: 124 — ABNORMAL HIGH
Glucose, Bld: 150 — ABNORMAL HIGH
Potassium: 4
Potassium: 4.1
Sodium: 137
Sodium: 138

## 2011-03-28 LAB — APTT: aPTT: 28

## 2011-03-28 LAB — COMPREHENSIVE METABOLIC PANEL
ALT: 27 U/L (ref 0–35)
Alkaline Phosphatase: 42 U/L (ref 39–117)
BUN: 29 mg/dL — ABNORMAL HIGH (ref 6–23)
CO2: 26 mEq/L (ref 19–32)
GFR calc Af Amer: 48 mL/min — ABNORMAL LOW (ref >90)
GFR calc non Af Amer: 42 mL/min — ABNORMAL LOW (ref >90)
Glucose, Bld: 166 mg/dL — ABNORMAL HIGH (ref 70–99)
Potassium: 5.4 mEq/L — ABNORMAL HIGH (ref 3.5–5.1)
Sodium: 137 mEq/L (ref 135–145)

## 2011-03-28 LAB — POCT I-STAT 7, (LYTES, BLD GAS, ICA,H+H)
O2 Saturation: 100
Sodium: 138
TCO2: 26
pCO2 arterial: 39.5
pO2, Arterial: 439 — ABNORMAL HIGH

## 2011-03-29 ENCOUNTER — Other Ambulatory Visit: Payer: Self-pay | Admitting: Thoracic Surgery

## 2011-03-29 DIAGNOSIS — C3490 Malignant neoplasm of unspecified part of unspecified bronchus or lung: Secondary | ICD-10-CM

## 2011-03-29 LAB — CROSSMATCH
ABO/RH(D): O POS
Antibody Screen: NEGATIVE

## 2011-03-29 LAB — URINE CULTURE
Colony Count: NO GROWTH
Culture  Setup Time: 201210171708

## 2011-03-29 LAB — APTT
aPTT: 40 seconds — ABNORMAL HIGH (ref 24–37)
aPTT: 41 — ABNORMAL HIGH

## 2011-03-29 LAB — DIFFERENTIAL
Basophils Relative: 1 % (ref 0–1)
Lymphs Abs: 2.9 10*3/uL (ref 0.7–4.0)
Monocytes Relative: 12 % (ref 3–12)
Neutro Abs: 6.8 10*3/uL (ref 1.7–7.7)
Neutrophils Relative %: 60 % (ref 43–77)

## 2011-03-29 LAB — COMPREHENSIVE METABOLIC PANEL
ALT: 37 U/L — ABNORMAL HIGH (ref 0–35)
AST: 39 U/L — ABNORMAL HIGH (ref 0–37)
Albumin: 3.6 g/dL (ref 3.5–5.2)
Alkaline Phosphatase: 41 U/L (ref 39–117)
Calcium: 10.1 mg/dL (ref 8.4–10.5)
Glucose, Bld: 130 mg/dL — ABNORMAL HIGH (ref 70–99)
Potassium: 4 mEq/L (ref 3.5–5.1)
Sodium: 135 mEq/L (ref 135–145)
Total Protein: 6.6 g/dL (ref 6.0–8.3)

## 2011-03-29 LAB — CBC
HCT: 37.1
Hemoglobin: 14.7 g/dL (ref 12.0–15.0)
MCH: 31.3 pg (ref 26.0–34.0)
MCV: 90.2
Platelets: 127 10*3/uL — ABNORMAL LOW (ref 150–400)
Platelets: 307
RBC: 4.7 MIL/uL (ref 3.87–5.11)
RBC: 4.11
WBC: 11.3 10*3/uL — ABNORMAL HIGH (ref 4.0–10.5)
WBC: 11.3 — ABNORMAL HIGH

## 2011-03-29 LAB — PROTIME-INR
INR: 1.08 (ref 0.00–1.49)
Prothrombin Time: 14.2 seconds (ref 11.6–15.2)

## 2011-03-29 LAB — BASIC METABOLIC PANEL
BUN: 7
Chloride: 103
GFR calc Af Amer: >60
GFR calc non Af Amer: >60
Potassium: 3.8
Sodium: 138

## 2011-03-29 LAB — GLUCOSE, CAPILLARY
Glucose-Capillary: 160 mg/dL — ABNORMAL HIGH (ref 70–99)
Glucose-Capillary: 129 mg/dL — ABNORMAL HIGH (ref 70–99)
Glucose-Capillary: 130 mg/dL — ABNORMAL HIGH (ref 70–99)

## 2011-03-29 NOTE — H&P (Signed)
[name], [name]                ACCOUNT NO.:  [id number]  MEDICAL RECORD NO.:  [id number]  LOCATION:  3304                         FACILITY:  [facility]  PHYSICIAN:  Carlota Raspberry, MD         DATE OF BIRTH:  11-19-1930  DATE OF ADMISSION:  03/27/2011 DATE OF DISCHARGE:                             HISTORY & PHYSICAL   PRIMARY CARE PHYSICIAN:  [name], NP at [facility] in [place].  She works with Dr. [name].  CHIEF COMPLAINT:  Difficulty breathing.  HISTORY OF PRESENT ILLNESS:  This is a 77 year old female with a history of lung cancer, status post right lower lobe resection, chemo and radiation, history of pulmonary embolism, on Coumadin with resultant GI bleed due to panniculitis in September 2012, COPD who presents with acute shortness of breath and found to have large saddle pulmonary embolus, transferred from [facility] for further evaluation.  The patient was recently admitted in February 22, 2011 for GI bleed and underwent a colonoscopy, which showed panniculitis, which was called as IBD versus infectious colitis and sent home with an antibiotic course and it appears that Coumadin was stopped during that admission.  Earlier this afternoon, she was lying in bed and got up and had acute sudden onset of shortness of breath with no associated chest pain.  She was so short of breath, she was unable to get out of bed.  911 was called and they found her to have an oxygen saturation of 83%, which was improved on a non-rebreather.  She was taken to [facility] ED, where her evaluation revealed that she had an elevated D-dimer and received a CTA showing a large saddle pulmonary embolism particularly involving the left main pulmonary artery and its branches.  He also noted that she had a white blood cell count of 21.6 with 11% bandemia.  She was transferred to [facility] for further evaluation.  On arrival, the patient was initially somehow triaged towards the Cardiology  Service; however, the Cardiology NP contacted me and felt that the patient will be better served on medicine, which I agreed with. Therefore, she was transferred to Medicine Service and I had asked for Critical Care to also help evaluate given that she is tachycardic and hypoxic with a large PE.  The patient relates the above history and denies any current chest pain, nausea, vomiting or other GI issues.  REVIEW OF SYSTEMS:  Otherwise negative.  PAST MEDICAL HISTORY: 1. Lung cancer, status post right lower lobe resection, chemo, and     radiation (as recently as 1 month ago). 2. Pulmonary embolism, on Coumadin until March 01, 2011, stopped     due to GI bleed. 3. History of CHF. 4. COPD. 5. Hypertension. 6. Hyperlipidemia. 7. GERD. 8. Diabetes type 2, diet controlled. 9. GI bleed due to panniculitis while on Coumadin in September 2012     with differential diagnosis including early onset IBD versus acute     infectious colitis.  Medication list was reconciled by the pharmacist and includes: 1. Celebrex 200 mg b.i.d. 2. Zantac 75 mg every morning. 3. Simvastatin 80 mg at bedtime. 4. Senokot. 5. Methocarbamol 500 mg q.8 h. p.r.n. pain.  6. Asacol 400 mg four tablets t.i.d. 7. Hydrocodone/APAP 7.5/650 q.4 h. p.r.n. 8. Glucosamine one tablet daily. 9. Fenofibrate 134 mg daily. 10.Amlodipine/benazepril 5/10 mg daily. 11.Alendronate 70 mg one tablet weekly.  There is in a reported allergy to ASPIRIN which causes GI upset/history of ulcers.  SOCIAL HISTORY:  She is a previous heavy smoker for at least 50 years. She currently lives with her daughter and her youngest son.  She is not currently smoking and does not do any alcohol or drugs.  FAMILY HISTORY:  Positive for CAD, hypertension, hyperlipidemia, and diabetes.  PHYSICAL EXAMINATION:  VITAL SIGNS:  Blood pressure is 109/62, pulse is 105, O2 is 90%, but has been in the mid 90s while I am talking to her on a 45%  Ventimask. GENERAL:  She is a large, but not very obese lady in the step-down bed. She is able to relate her history, but does appear short of breath after very short sentences even with wearing the Ventimask. HEENT:  Her pupils are round and equal at about 5 mm bilaterally.  Her extraocular muscles are intact.  Her sclerae are clear.  Her mouth is moist and normal-appearing with no gross lesions noted. LUNGS:  Her lungs are clear to auscultation bilaterally.  Her air movement is fair with no adventitious lung sounds noted. HEART:  Regular rate and rhythm, but a bit tachycardic with no gross murmurs or gallops appreciated. ABDOMEN:  Soft, nontender, nondistended, a bit overweight, but not morbidly obese. EXTREMITIES:  Her extremities are warm, well perfused with no cyanosis, clubbing.  There is no bilateral lower extremity edema. NEUROLOGIC:  Her neurological exam is grossly nonfocal.  Lab work from [facility] shows an ABG of 7.32, O2 47, CO2 of 37. BMP is 1560.  D-dimer is 4920.  White blood cell count 21.6 with 11% bands, hematocrit 47.3, platelets are normal.  INR 1.1.  Chemistry is normal except a glucose of 206 with a bicarb of 20, and BUN and creatinine are 23 and 1.38.  Her troponin is negative.  Her AST is 58, otherwise her LFTs are normal.  RADIOGRAPHY:  CTA shows a large saddle pulmonary embolus involving the left main, PA and branches.  Chest x-ray shows cardiomegaly with mild chronic bronchitis versus chronic interstitial lung disease.  EKG is normal sinus rhythm with a normal axis.  P-waves show LAE in V1, QRS's are narrow at 78 milliseconds.  There is good R-wave progression. There is ST depression in leads 1 and 2; otherwise, fairly normal, but with a shifting baseline.  T-waves are all grossly normal.  It is tachycardic at 131 beats per minute.  There was no prior for comparison.  IMPRESSION:  This is a 77 year old female with a history of lung  cancer, status post resection, chemo, radiation; history of pulmonary embolism, previously on Coumadin until recent admission and stopped due to gastrointestinal bleed; history of type 2 diabetes; chronic obstructive pulmonary disease, who presents with acute shortness of breath and found to have a large saddle pulmonary embolism. 1. Pulmonary embolism.  There are no current indications for     thrombolysis, which is controversial at any ways.  Pulmonary has     been consulted and we will appreciate their recommendations.  She     is currently satting decently on Ventimask and is minimally     tachycardic with stable blood pressures.  We will treat her with     Lovenox 100 mg q.12 h., get an echo in the  morning without contrast     to evaluate her right ventricular function, and get bilateral lower     extremity Doppler exams.  Ultimately given her history of     malignancy and prior pulmonary embolism, she has likely bought     herself lifelong anticoagulation despite the recent     gastrointestinal bleed. 2. Leukocytosis with bandemia.  Currently an unclear source, a     question whether this is due to the pulmonary embolism itself.  We     will cover empirically given no culture data at present, with     vancomycin and Zosyn.  We will need a blood culture, urine culture,     and UA.  There are no reported fevers. 3. History of colitis.  She is still taking Asacol on her medication     list.  This was started due to the inability to rule out early     inflammatory bowel disease during the last admission.  However, it     appears that her symptoms are better and I am not 100% sure this is     inflammatory bowel disease; therefore, I am going to hold Asacol     for now and we will consult Gastroenterology in the morning to     consider for further follow up on whether she needs to continue     this medicine. 4. Acute kidney injury.  Her creatinine at 1.38 as above its baseline     of  0.8 to 0.9.  We will give her some gentle fluids overnight and     trend her BMETs. 5. History of hypertension.  We will hold her home combination     amlodipine and benazepril given her acute kidney injury and also     tachycardia and acutely different hemodynamics. 6. Diabetes.  We will cover her with sliding scale insulin while     admitted. 7. Fluid, electrolytes and nutrition.  We will give her gentle     maintenance IV fluids overnight and a heart-healthy diabetic diet. 8. IV access.  She has a peripheral IV in place. 9. Prophylaxis.  Lovenox systemically, Colace, senna, Zofran, and     Tylenol. 10.Code status.  She is a full code, discussed with the patient.  She will be admitted to step-Down Unit under [facility] Team 8.          ______________________________ Carlota Raspberry, MD     EB/MEDQ  D:  03/27/2011  T:  03/27/2011  Job:  [id number]  Electronically Signed by Carlota Raspberry MD on 03/29/2011 07:50:53 PM

## 2011-03-30 LAB — COMPREHENSIVE METABOLIC PANEL
AST: 30 U/L (ref 0–37)
Albumin: 3.6 g/dL (ref 3.5–5.2)
BUN: 20 mg/dL (ref 6–23)
CO2: 27 mEq/L (ref 19–32)
Calcium: 10 mg/dL (ref 8.4–10.5)
Chloride: 99 mEq/L (ref 96–112)
Creatinine, Ser: 0.9 mg/dL (ref 0.50–1.10)
GFR calc non Af Amer: 60 mL/min — ABNORMAL LOW (ref >90)
Total Bilirubin: 0.3 mg/dL (ref 0.3–1.2)

## 2011-03-30 LAB — GLUCOSE, CAPILLARY
Glucose-Capillary: 138 mg/dL — ABNORMAL HIGH (ref 70–99)
Glucose-Capillary: 165 mg/dL — ABNORMAL HIGH (ref 70–99)
Glucose-Capillary: 134 mg/dL — ABNORMAL HIGH (ref 70–99)

## 2011-03-30 LAB — CBC
HCT: 45.3 % (ref 36.0–46.0)
MCH: 31.1 pg (ref 26.0–34.0)
MCHC: 33.8 g/dL (ref 30.0–36.0)
MCV: 92.1 fL (ref 78.0–100.0)
RDW: 13.9 % (ref 11.5–15.5)

## 2011-03-30 LAB — MAGNESIUM: Magnesium: 1.9 mg/dL (ref 1.5–2.5)

## 2011-03-30 LAB — PROTIME-INR: INR: 1.29 (ref 0.00–1.49)

## 2011-03-30 LAB — PHOSPHORUS: Phosphorus: 2.7 mg/dL (ref 2.3–4.6)

## 2011-03-30 LAB — APTT: aPTT: 47 seconds — ABNORMAL HIGH (ref 24–37)

## 2011-03-30 LAB — DIFFERENTIAL
Eosinophils Absolute: 0.2 10*3/uL (ref 0.0–0.7)
Eosinophils Relative: 2 % (ref 0–5)
Lymphocytes Relative: 23 % (ref 12–46)
Lymphs Abs: 2.5 10*3/uL (ref 0.7–4.0)
Monocytes Absolute: 1 10*3/uL (ref 0.1–1.0)

## 2011-03-31 ENCOUNTER — Other Ambulatory Visit: Payer: Self-pay | Admitting: Oncology

## 2011-03-31 DIAGNOSIS — C3430 Malignant neoplasm of lower lobe, unspecified bronchus or lung: Secondary | ICD-10-CM

## 2011-03-31 LAB — CBC
HCT: 44.1 % (ref 36.0–46.0)
Hemoglobin: 14.8 g/dL (ref 12.0–15.0)
MCV: 92.8 fL (ref 78.0–100.0)
RBC: 4.75 MIL/uL (ref 3.87–5.11)
WBC: 9.5 10*3/uL (ref 4.0–10.5)

## 2011-03-31 LAB — GLUCOSE, CAPILLARY
Glucose-Capillary: 158 mg/dL — ABNORMAL HIGH (ref 70–99)
Glucose-Capillary: 140 mg/dL — ABNORMAL HIGH (ref 70–99)
Glucose-Capillary: 123 mg/dL — ABNORMAL HIGH (ref 70–99)

## 2011-03-31 LAB — COMPREHENSIVE METABOLIC PANEL
AST: 23 U/L (ref 0–37)
Albumin: 3.4 g/dL — ABNORMAL LOW (ref 3.5–5.2)
Alkaline Phosphatase: 45 U/L (ref 39–117)
BUN: 20 mg/dL (ref 6–23)
Potassium: 3.5 mEq/L (ref 3.5–5.1)
Sodium: 139 mEq/L (ref 135–145)
Total Protein: 6.5 g/dL (ref 6.0–8.3)

## 2011-03-31 LAB — MAGNESIUM: Magnesium: 1.9 mg/dL (ref 1.5–2.5)

## 2011-03-31 LAB — DIFFERENTIAL
Basophils Absolute: 0 10*3/uL (ref 0.0–0.1)
Lymphocytes Relative: 28 % (ref 12–46)
Lymphs Abs: 2.7 10*3/uL (ref 0.7–4.0)
Neutro Abs: 5.5 10*3/uL (ref 1.7–7.7)

## 2011-03-31 LAB — APTT: aPTT: 56 seconds — ABNORMAL HIGH (ref 24–37)

## 2011-03-31 LAB — PROTIME-INR: INR: 1.8 — ABNORMAL HIGH (ref 0.00–1.49)

## 2011-04-01 ENCOUNTER — Inpatient Hospital Stay (HOSPITAL_COMMUNITY): Payer: Medicare Other

## 2011-04-01 LAB — DIFFERENTIAL
Basophils Absolute: 0 10*3/uL (ref 0.0–0.1)
Basophils Relative: 0 % (ref 0–1)
Eosinophils Relative: 3 % (ref 0–5)
Monocytes Absolute: 0.9 10*3/uL (ref 0.1–1.0)
Neutro Abs: 5.9 10*3/uL (ref 1.7–7.7)

## 2011-04-01 LAB — GLUCOSE, CAPILLARY
Glucose-Capillary: 133 mg/dL — ABNORMAL HIGH (ref 70–99)
Glucose-Capillary: 103 mg/dL — ABNORMAL HIGH (ref 70–99)
Glucose-Capillary: 119 mg/dL — ABNORMAL HIGH (ref 70–99)

## 2011-04-01 LAB — MAGNESIUM: Magnesium: 2 mg/dL (ref 1.5–2.5)

## 2011-04-01 LAB — COMPREHENSIVE METABOLIC PANEL
Albumin: 3.4 g/dL — ABNORMAL LOW (ref 3.5–5.2)
BUN: 19 mg/dL (ref 6–23)
Creatinine, Ser: 0.9 mg/dL (ref 0.50–1.10)
Total Protein: 6.5 g/dL (ref 6.0–8.3)

## 2011-04-01 LAB — PROTIME-INR
INR: 2 — ABNORMAL HIGH (ref 0.00–1.49)
Prothrombin Time: 23 seconds — ABNORMAL HIGH (ref 11.6–15.2)

## 2011-04-01 LAB — CBC
Hemoglobin: 15 g/dL (ref 12.0–15.0)
MCHC: 34.2 g/dL (ref 30.0–36.0)
RDW: 14 % (ref 11.5–15.5)

## 2011-04-01 LAB — PHOSPHORUS: Phosphorus: 3.1 mg/dL (ref 2.3–4.6)

## 2011-04-02 ENCOUNTER — Inpatient Hospital Stay (HOSPITAL_COMMUNITY): Payer: Medicare Other

## 2011-04-02 LAB — DIFFERENTIAL
Basophils Absolute: 0 10*3/uL (ref 0.0–0.1)
Lymphocytes Relative: 26 % (ref 12–46)
Lymphs Abs: 2.3 10*3/uL (ref 0.7–4.0)
Monocytes Absolute: 0.9 10*3/uL (ref 0.1–1.0)
Neutro Abs: 5.4 10*3/uL (ref 1.7–7.7)

## 2011-04-02 LAB — COMPREHENSIVE METABOLIC PANEL
AST: 19 U/L (ref 0–37)
Albumin: 3.7 g/dL (ref 3.5–5.2)
Alkaline Phosphatase: 47 U/L (ref 39–117)
BUN: 20 mg/dL (ref 6–23)
CO2: 32 mEq/L (ref 19–32)
Chloride: 96 mEq/L (ref 96–112)
Potassium: 3.2 mEq/L — ABNORMAL LOW (ref 3.5–5.1)
Total Bilirubin: 0.4 mg/dL (ref 0.3–1.2)

## 2011-04-02 LAB — CBC
HCT: 46.1 % — ABNORMAL HIGH (ref 36.0–46.0)
Hemoglobin: 15.6 g/dL — ABNORMAL HIGH (ref 12.0–15.0)
MCHC: 33.8 g/dL (ref 30.0–36.0)
MCV: 92.2 fL (ref 78.0–100.0)

## 2011-04-02 LAB — CULTURE, BLOOD (ROUTINE X 2)
Culture  Setup Time: 201210161045
Culture: NO GROWTH

## 2011-04-02 LAB — GLUCOSE, CAPILLARY: Glucose-Capillary: 108 mg/dL — ABNORMAL HIGH (ref 70–99)

## 2011-04-02 LAB — PROTIME-INR: INR: 2.12 — ABNORMAL HIGH (ref 0.00–1.49)

## 2011-04-02 LAB — APTT: aPTT: 50 seconds — ABNORMAL HIGH (ref 24–37)

## 2011-04-03 ENCOUNTER — Ambulatory Visit: Payer: Medicare Other | Admitting: Thoracic Surgery

## 2011-04-03 LAB — BASIC METABOLIC PANEL
Calcium: 9.8 mg/dL (ref 8.4–10.5)
GFR calc non Af Amer: 67 mL/min — ABNORMAL LOW (ref >90)
Glucose, Bld: 129 mg/dL — ABNORMAL HIGH (ref 70–99)
Sodium: 137 mEq/L (ref 135–145)

## 2011-04-03 LAB — APTT: aPTT: 48 seconds — ABNORMAL HIGH (ref 24–37)

## 2011-04-03 LAB — GLUCOSE, CAPILLARY
Glucose-Capillary: 157 mg/dL — ABNORMAL HIGH (ref 70–99)
Glucose-Capillary: 97 mg/dL (ref 70–99)

## 2011-04-03 LAB — CBC
Hemoglobin: 15.6 g/dL — ABNORMAL HIGH (ref 12.0–15.0)
RBC: 5.16 MIL/uL — ABNORMAL HIGH (ref 3.87–5.11)
WBC: 10.6 10*3/uL — ABNORMAL HIGH (ref 4.0–10.5)

## 2011-04-03 LAB — DIFFERENTIAL
Basophils Absolute: 0 10*3/uL (ref 0.0–0.1)
Basophils Relative: 0 % (ref 0–1)
Neutro Abs: 7 10*3/uL (ref 1.7–7.7)
Neutrophils Relative %: 66 % (ref 43–77)

## 2011-04-03 LAB — PROTIME-INR: Prothrombin Time: 24.3 seconds — ABNORMAL HIGH (ref 11.6–15.2)

## 2011-04-03 LAB — PHOSPHORUS: Phosphorus: 3.5 mg/dL (ref 2.3–4.6)

## 2011-04-06 ENCOUNTER — Encounter: Payer: Self-pay | Admitting: Nurse Practitioner

## 2011-04-09 NOTE — Discharge Summary (Signed)
[name], [name]                ACCOUNT NO.:  [id number]  MEDICAL RECORD NO.:  [id number]  LOCATION:  5128                         FACILITY:  [facility]  PHYSICIAN:  Altha Harm, MDDATE OF BIRTH:  1930/12/09  DATE OF ADMISSION:  03/27/2011 DATE OF DISCHARGE:  04/03/2011                              DISCHARGE SUMMARY   DISCHARGE DISPOSITION:  Skilled nursing facility for short-term rehab.  FINAL DISCHARGE DIAGNOSES: 1. Acute deep vein thrombosis. 2. Acute pulmonary embolus. 3. Acute hypoxic respiratory failure secondary to pulmonary embolus. 4. Acute on chronic diastolic heart failure, resolving. 5. History of lung cancer status post resection. 6. History of chronic obstructive pulmonary disease quiescent. 7. Diabetes type 2 diet controlled. 8. Acute kidney injury secondary to dehydration now resolved. 9. Hypertension. 10.History of colitis, taking Asacol.  DISCHARGE MEDICATIONS:  Include the following. 1. Tylenol 650 mg p.o. q.4 hours p.r.n. pain. 2. Lasix 40 mg p.o. daily. 3. Crestor 20 mg p.o. at bedtime. 4. Warfarin 4 mg p.o. to be taken tonight at 6 o'clock and then INR     checked and further titration of Coumadin by the supervising     physician. 5. Alendronate 70 mg p.o. weekly. 6. Amlodipine/benazepril 5/10 mg p.o. daily. 7. Asacol 1600 mg p.o. t.i.d. 8. Fenofibrate 134 mg p.o. daily. 9. Glucosamine over-the-counter 1 tab p.o. daily. 10.Hydrocodone/APAP 7.5/650 p.o. q.4 hours p.r.n. pain. 11.Robaxin 500 mg p.o. q.8 hours p.r.n. pain. 12.Senokot 1-3 caps p.o. daily as needed. 13.Zantac 75 mg p.o. daily.  DISCONTINUED MEDICATIONS:  Include simvastatin and Celebrex.  CONSULTANTS:  None.  PROCEDURES:  None.  DIAGNOSTIC STUDIES: 1. X-ray of the right shoulder, which showed degenerative changes with     no acute findings. 2. Portable chest x-ray on October 21st which shows cardiomegaly and     low lung volumes with resultant interstitial  prominence. Impression A.  Right hemidiaphragm of the patient.  Cannot exclude adjacent airspace disease. B.  Clinically, AP portable film with PA and lateral radiograph is possible it should be considered. 1. Two-view chest x-ray on October 22 which shows no acute process.     Stable chronic findings noted.  Please note that the patient was     transferred from [facility] and had a CT angiogram which     shows bilateral pulmonary emboli.  PRIMARY CARE PHYSICIAN:  Dr. [name] in [place] at [facility].  CODE STATUS:  Full code.  ALLERGIES:  ASPIRIN causes GI upset.  CHIEF COMPLAINT:  Difficulty with breathing.  HISTORY OF PRESENT ILLNESS:  Please refer to the H and P by Dr. [name] for details of the HPI, however, in short, this is a 77 year old female with a history of lung cancer, lung resection status post chemo, radiation therapy and a history of pulmonary embolus.  He had been on Coumadin.  The patient had a GI bleed due to panniculitis in September 2012, and her Coumadin was discontinued.  The patient presented to [facility].  The patient was diagnosed with bilateral PE and she was transferred over to [facility] for further evaluation and management in an event that Specialty Services were required.  HOSPITAL COURSE: 1. Bilateral pulmonary emboli.  The patient was started on Lovenox and     then Coumadin was started.  The patient has been therapeutic on     Coumadin for 48 hours.  Of note, no further bridging is required.     The patient had been on Coumadin 3 mg prior to September period and     likely will end up on a dose of 3 or 4 mg.  Recommendations from     pharmacy is that tonight should be given 4 mg of Coumadin with INR     rechecked on tomorrow to make sure the patient is within the     therapeutic range. 2. Acute DVT.  Bilateral extremity venous Dopplers showed left     subacute DVT in the femoral vein and popliteal vein.  Please see      above for treatment. 3. Acute on chronic diastolic heart failure.  The patient had 2-D     echogram which showed that she had chronic diastolic heart failure.     During this hospitalization, the patient developed an acute     component, was treated with Lasix and this appears to have had to     be compensated at this point.  I am discharging the patient on     Lasix to maintain her compensated state. 4. Acute on chronic hypoxic respiratory failure.  The patient has a     low level hypoxic.  With the pulmonary emboli, she developed acute     component and is now requiring oxygen.  She is down to 3 L of     oxygen.  She should wear this around the clock and wean as     tolerated. 5. History GI bleeding.  The patient was recently hospitalized in     September and was noted to have a gastrointestinal bleeding.  She     had been on Celebrex prior to this and Celebrex was discontinued at     this time and should continue to be held.  Owing to the fact that     the patient is not a high risk for bleeding then she is on     Coumadin. 6. Arthritis and back pain.  The patient has chronic arthritis and     chronic pain.  She has been given a prescription for     hydrocodone/APAP.  Otherwise, her chronic problems remained stable.  PERTINENT LABORATORY STUDIES:  At the time of discharge, she has a white blood cell count of 10.6, hemoglobin of 15.6, hematocrit of 46.9, platelet count of 187.  INR is 2.14.  Sodium 137, potassium 3.6, chloride 98, bicarb 29, BUN 22, creatinine 0.82.  PHYSICAL EXAMINATION:  HEENT:  She is normocephalic, atraumatic.  Pupils equally round, reactive to light and accommodation.  Extraocular movements are intact.  Oropharynx is moist.  No exudate, erythema, or lesions are noted.  NECK:  Trachea is midline.  No masses.  No thyromegaly.  No JVD.  No carotid bruit.  RESPIRATORY:  She has a normal respiratory effort.  Equal excursion bilaterally.  No wheezing or rhonchi  noted. CARDIOVASCULAR:  She has got a normal S1 and S2.  No murmurs, rubs, or gallops noted.  PMI is nondisplaced.  No heaves or thrills on palpation. ABDOMEN:  Obese, soft, nontender, nondistended.  No masses.  No hepatosplenomegaly.  DIETARY RESTRICTIONS:  The patient should be on a heart healthy, diabetic vitamin K stable diet.  PHYSICAL ACTIVITY:  The patient should ambulate with assistance  and under the direction of Physical Therapy.     Altha Harm, MD     MAM/MEDQ  D:  04/03/2011  T:  04/03/2011  Job:  [id number]  Electronically Signed by Marthann Schiller MD on 04/09/2011 01:19:49 PM

## 2011-04-16 ENCOUNTER — Telehealth: Payer: Self-pay | Admitting: Oncology

## 2011-04-16 ENCOUNTER — Other Ambulatory Visit: Payer: Medicare Other | Admitting: Lab

## 2011-04-16 ENCOUNTER — Ambulatory Visit (HOSPITAL_BASED_OUTPATIENT_CLINIC_OR_DEPARTMENT_OTHER): Payer: Medicare Other | Admitting: Physician Assistant

## 2011-04-16 VITALS — BP 116/64 | HR 88 | Temp 98.6°F | Ht 64.0 in | Wt 202.0 lb

## 2011-04-16 DIAGNOSIS — R5381 Other malaise: Secondary | ICD-10-CM

## 2011-04-16 DIAGNOSIS — C7A1 Malignant poorly differentiated neuroendocrine tumors: Secondary | ICD-10-CM

## 2011-04-16 DIAGNOSIS — C3430 Malignant neoplasm of lower lobe, unspecified bronchus or lung: Secondary | ICD-10-CM

## 2011-04-16 DIAGNOSIS — Z86718 Personal history of other venous thrombosis and embolism: Secondary | ICD-10-CM

## 2011-04-16 LAB — COMPREHENSIVE METABOLIC PANEL
ALT: 9 U/L (ref 0–35)
AST: 16 U/L (ref 0–37)
Calcium: 8.9 mg/dL (ref 8.4–10.5)
Chloride: 98 mEq/L (ref 96–112)
Creatinine, Ser: 1 mg/dL (ref 0.50–1.10)
Potassium: 3.9 mEq/L (ref 3.5–5.3)
Sodium: 138 mEq/L (ref 135–145)

## 2011-04-16 LAB — CBC WITH DIFFERENTIAL/PLATELET
BASO%: 0.3 % (ref 0.0–2.0)
LYMPH%: 14 % (ref 14.0–49.7)
MCHC: 33.2 g/dL (ref 31.5–36.0)
MONO#: 0.8 10*3/uL (ref 0.1–0.9)
RBC: 4.71 10*6/uL (ref 3.70–5.45)
RDW: 14.1 % (ref 11.2–14.5)
WBC: 11.2 10*3/uL — ABNORMAL HIGH (ref 3.9–10.3)
lymph#: 1.6 10*3/uL (ref 0.9–3.3)

## 2011-04-16 LAB — LACTATE DEHYDROGENASE: LDH: 171 U/L (ref 94–250)

## 2011-04-16 NOTE — Progress Notes (Signed)
CC:   [name], M.D. Jo Alken, NP [name]. Jo Howard, MD  INTERIM HISTORY:  Ms. 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  The patient reports since her last clinic visit on December 14, 2010 she was hospitalized on 2 different occasions, the 1st time between September 13th and February 27, 2011 for management of nausea, vomiting, abdominal discomfort and rectal bleeding.  During that hospitalization, the patient states she was diagnosed with colitis and was followed by Dr. Jeani Hawking.  She ultimately underwent a colonoscopy on February 24, 2011 which revealed nonspecific pancolitis with granularity throughout the colon and rectum with biopsies taken from the left colon rectosigmoid and random biopsies.  She also had a CT of the abdomen showing no evidence of inflammatory process or abnormal fluid collections, and the bowel showed no definite evidence of wall thickening or dilatation.  Overall, no acute findings.  There was hepatic steatosis.  Apparently, during that hospitalization, the patient was taken off of her Coumadin and ultimately was rehospitalized between October 16th and April 03, 2011 after the patient states she was having some difficulty breathing and increased cough.  During that hospitalization, she had a Doppler study of the bilateral lower extremities on October 16th which revealed acute left lower extremity DVT.  A 2-view chest x-ray on October 22nd revealed no acute process and stable chronic findings.  Of note, the patient had ultimately been transferred from [facility] where she had a CT angiogram of the chest revealing bilateral pulmonary emboli.  The patient was initiated on Lovenox and then was bridged to Coumadin, and she remains on Coumadin post discharge  from the hospital.  She also recently was discharged from [facility].  Currently the patient reports some fatigue but is able to complete ADLs.  She does now utilize oxygen at 3 L around the clock. She has had no fevers, chills or night sweats.  She does have intermittent cough, and at times this is productive, but usually a clear phlegm.  She states that she was scheduled for followup with Dr. Cameron Proud but had to cancel this due to her being in rehab, but her daughter states that they plan to call and reschedule this appointment. She is not having any significant anorexia.  No nausea, vomiting, constipation, or diarrhea.  She does still report some intermittent generalized abdominal tenderness.  She has had no difficulty urinating and no problems with swelling of extremities.  CURRENT MEDICATIONS:  Reviewed and recorded.  PHYSICAL EXAMINATION:  Vital Signs:  Temperature is 98, heart rate 88, respirations 20, blood pressure 116/64, O2 saturation 99% on 3 L. Weight is 202 pounds.  General:  This is a well-developed, well- nourished, white female who is seated in a wheelchair for physical exam. HEENT:  Sclerae anicteric.  There is no oral thrush or mucositis.  Skin: Without rashes or lesions  although she does have a few ecchymotic areas noted on the extremities.  Lymphatics:  No cervical, supraclavicular, axillary, or inguinal lymphadenopathy.  Cardiac:  Regular rate and rhythm without murmurs or gallops.  Peripheral pulses are palpable. Chest:  Lungs clear to auscultation.  Abdomen:  Positive bowel sounds. Soft, nontender, nondistended.  No organomegaly.  Extremities: Bilateral dependent lower extremity edema.  Neurologic:  Alert and oriented  x3.  Strength, sensation, and coordination all grossly intact.  LABORATORIES:  CBC with differential reveals a white blood count of 11.2, hemoglobin 14.3, hematocrit of 42.9, platelets of 252, ANC of  8.5 and MCV of 91.1.  C-Met and LDH are currently pending.  IMPRESSION/PLAN: 1. [name] is a 77 year old white female with high-grade     neuroendocrine lung cancer with 2 lesions involving the right lower     lobe of the lung.  She is status post right lower lobectomy on     August 09, 2005 after receiving preoperative chemotherapy with 4     cycles of carboplatin and VP-16 with Neulasta support.  The patient     most recently had a CT scan of the chest, by Dr. Edwyna Shell on August 30, 2010 which revealed a new nodular lesion in the periphery of     the right upper lobe measuring 13 x 15 x 13 mm suspicious for a new     lung cancer.  Followup PET scan September 07, 2010 revealed     hypermetabolic activity with this lesion with SUV of 7.5, and the     lesion abutted the pleural surface.  There was no other evidence of     cancer although the left adrenal gland  did have an SUV max of 4.3.     This was felt to be a lipid poor adenoma or hyperplasia.  Followup     MRI of the head with and without contrast revealed no evidence of     metastatic disease.  This was on October 05, 2010.  The patient     ultimately underwent fine-needle aspirate of the lung nodule on     September 21, 2010 which revealed a non-small cell cancer.  She     received stereotactic radiation of 50 Gy in 5 fractions between Oct 17, 2010 and Oct 27, 2010.  The patient states that she is overdue     for followup with Dr. Edwyna Shell due to recent hospitalizations and     plans to call his office to schedule a followup appointment. 2. Patient with a history of pulmonary emboli dating back to October     2006.  She had been on Coumadin therapy which apparently was     discontinued in September secondary to a rectal bleed.  She     ultimately ended up having an acute left lower extremity DVT as     well as pulmonary emboli on angio CT.  She was treated with Lovenox     bridged to Coumadin and continues on Coumadin therapy. 3. The  patient will be scheduled for a followup appointment with Dr.     Arline Asp in 4 months' time at which time we will reassess a CBC     with differential, C-Met and LDH.  She is advised to call in the     interim if any questions or problems.    ______________________________ Sherilyn Banker, MSN, ANP, BC RJ/MEDQ  D:  04/16/2011  T:  04/16/2011  Job:  [id number]

## 2011-04-16 NOTE — Progress Notes (Signed)
This office note has been dictated. CSN: [id number]

## 2011-04-16 NOTE — Telephone Encounter (Signed)
gv pt appt schedule for march. Lb/fu only 4mos.

## 2011-06-30 ENCOUNTER — Emergency Department (HOSPITAL_COMMUNITY): Payer: Medicare Other

## 2011-06-30 ENCOUNTER — Other Ambulatory Visit: Payer: Self-pay

## 2011-06-30 ENCOUNTER — Encounter (HOSPITAL_COMMUNITY): Payer: Self-pay

## 2011-06-30 ENCOUNTER — Emergency Department (HOSPITAL_COMMUNITY)
Admission: EM | Admit: 2011-06-30 | Discharge: 2011-06-30 | Disposition: A | Discharge status: Home / Self Care | Payer: Medicare Other | Attending: Emergency Medicine | Admitting: Emergency Medicine

## 2011-06-30 DIAGNOSIS — I509 Heart failure, unspecified: Secondary | ICD-10-CM | POA: Insufficient documentation

## 2011-06-30 DIAGNOSIS — Z79899 Other long term (current) drug therapy: Secondary | ICD-10-CM | POA: Insufficient documentation

## 2011-06-30 DIAGNOSIS — Z85118 Personal history of other malignant neoplasm of bronchus and lung: Secondary | ICD-10-CM | POA: Insufficient documentation

## 2011-06-30 DIAGNOSIS — R091 Pleurisy: Secondary | ICD-10-CM | POA: Insufficient documentation

## 2011-06-30 DIAGNOSIS — Z7901 Long term (current) use of anticoagulants: Secondary | ICD-10-CM | POA: Insufficient documentation

## 2011-06-30 DIAGNOSIS — R079 Chest pain, unspecified: Secondary | ICD-10-CM | POA: Insufficient documentation

## 2011-06-30 DIAGNOSIS — I10 Essential (primary) hypertension: Secondary | ICD-10-CM | POA: Insufficient documentation

## 2011-06-30 DIAGNOSIS — R0602 Shortness of breath: Secondary | ICD-10-CM | POA: Insufficient documentation

## 2011-06-30 DIAGNOSIS — M549 Dorsalgia, unspecified: Secondary | ICD-10-CM | POA: Insufficient documentation

## 2011-06-30 LAB — DIFFERENTIAL
Basophils Absolute: 0 10*3/uL (ref 0.0–0.1)
Basophils Relative: 0 % (ref 0–1)
Eosinophils Relative: 2 % (ref 0–5)
Monocytes Absolute: 0.7 10*3/uL (ref 0.1–1.0)
Neutro Abs: 8.5 10*3/uL — ABNORMAL HIGH (ref 1.7–7.7)

## 2011-06-30 LAB — CBC
HCT: 43.3 % (ref 36.0–46.0)
MCHC: 33.9 g/dL (ref 30.0–36.0)
RDW: 14.3 % (ref 11.5–15.5)

## 2011-06-30 LAB — BASIC METABOLIC PANEL
Calcium: 9.8 mg/dL (ref 8.4–10.5)
Chloride: 102 mEq/L (ref 96–112)
Creatinine, Ser: 0.82 mg/dL (ref 0.50–1.10)
GFR calc Af Amer: 77 mL/min — ABNORMAL LOW (ref >90)
GFR calc non Af Amer: 67 mL/min — ABNORMAL LOW (ref >90)

## 2011-06-30 LAB — CARDIAC PANEL(CRET KIN+CKTOT+MB+TROPI): CK, MB: 1.8 ng/mL (ref 0.3–4.0)

## 2011-06-30 LAB — PROTIME-INR: Prothrombin Time: 18.3 seconds — ABNORMAL HIGH (ref 11.6–15.2)

## 2011-06-30 MED ORDER — OXYCODONE-ACETAMINOPHEN 5-325 MG PO TABS: 2.0000 | ORAL_TABLET | ORAL | Status: AC | PRN

## 2011-06-30 MED ORDER — ENOXAPARIN SODIUM 100 MG/ML ~~LOC~~ SOLN
1.0000 mg/kg | SUBCUTANEOUS | Status: AC
  Administered 2011-06-30: 90 mg via SUBCUTANEOUS
  Filled 2011-06-30: qty 1

## 2011-06-30 MED ORDER — OXYCODONE-ACETAMINOPHEN 5-325 MG PO TABS
2.0000 | ORAL_TABLET | Freq: Once | ORAL | Status: AC
  Administered 2011-06-30: 2 via ORAL
  Filled 2011-06-30: qty 2

## 2011-06-30 MED ORDER — IOHEXOL 300 MG/ML  SOLN
80.0000 mL | Freq: Once | INTRAMUSCULAR | Status: AC | PRN
  Administered 2011-06-30: 80 mL via INTRAVENOUS

## 2011-06-30 MED ORDER — ENOXAPARIN SODIUM 100 MG/ML ~~LOC~~ SOLN: 90.0000 mg | Freq: Two times a day (BID) | SUBCUTANEOUS | Status: DC

## 2011-06-30 MED ORDER — IBUPROFEN 800 MG PO TABS: 800.0000 mg | ORAL_TABLET | Freq: Three times a day (TID) | ORAL | Status: AC

## 2011-06-30 NOTE — ED Notes (Signed)
Pt. Reports developing lt. Anterior chest pain around 0300 am, woke her up.  The pain is aching and deep breath increases the pain , sharp.  The pain radiates into her lt. Shoulder and back.  She is sob but she denies any n/v.  Pt. Reports having a hx of blood clots in her lungs

## 2011-06-30 NOTE — ED Provider Notes (Signed)
History     CSN: [id number]  Arrival date & time 06/30/11  1030   First MD Initiated Contact with Patient 06/30/11 1039      Chief Complaint  Patient presents with  . Chest Pain    Pt. reports hx of blood clot in her lung    (Consider location/radiation/quality/duration/timing/severity/associated sxs/prior treatment) HPI Comments: Patient awoke from sleep around 3 AM with left-sided pleuritic chest pain that is sharp and stabbing radiating to her back. Associated with shortness of breath and feels similar to her previous pulmonary embolism. She is on Coumadin as well as chronic oxygen for history of lung cancer, CHF and COPD. There is been no increase in her oxygen requirement. She denies any cough or fever. No leg pain or swelling. No rash on chest wall. Pain radiates from underneath her left breast around back to scapula denies any coronary artery disease history. His compliance with her Coumadin since last level was 2.5  The history is provided by the patient.    Past Medical History  Diagnosis Date  . Cancer     lung ca  . CHF (congestive heart failure)   . Hypertension   . Lung cancer     History reviewed. No pertinent past surgical history.  No family history on file.  History  Substance Use Topics  . Smoking status: Not on file  . Smokeless tobacco: Not on file  . Alcohol Use: No    OB History    Grav Para Term Preterm Abortions TAB SAB Ect Mult Living                  Review of Systems  Constitutional: Negative for activity change and appetite change.  HENT: Negative for congestion and rhinorrhea.   Respiratory: Positive for shortness of breath. Negative for cough.   Cardiovascular: Positive for chest pain. Negative for leg swelling.  Gastrointestinal: Negative for nausea, vomiting and abdominal pain.  Genitourinary: Negative for dysuria and hematuria.  Musculoskeletal: Positive for back pain.  Skin: Negative for rash.  Neurological: Positive for  light-headedness. Negative for headaches.    Allergies  Aspirin  Home Medications   Current Outpatient Rx  Name Route Sig Dispense Refill  . ACETAMINOPHEN ER 650 MG PO TBCR Oral Take 650 mg by mouth every 8 (eight) hours as needed. For pain    . ALENDRONATE SODIUM 70 MG PO TABS Oral Take 70 mg by mouth every 7 (seven) days. Take with a full glass of water on an empty stomach. On Tuesdays    . AMLODIPINE BESY-BENAZEPRIL HCL 5-10 MG PO CAPS Oral Take 1 capsule by mouth daily.     Marland Kitchen CIMETIDINE 200 MG PO TABS Oral Take 200 mg by mouth daily.      Marland Kitchen DIPHENHYDRAMINE-APAP (SLEEP) 25-500 MG PO TABS Oral Take 1 tablet by mouth at bedtime as needed. For sleeplessness    . FENOFIBRATE 145 MG PO TABS Oral Take 145 mg by mouth daily.     . FUROSEMIDE 40 MG PO TABS Oral Take 40 mg by mouth daily.    Marland Kitchen GLUCOSAMINE 1500 COMPLEX PO Oral Take 750 mg by mouth daily.    Marland Kitchen HYDROCODONE-ACETAMINOPHEN 7.5-650 MG PO TABS Oral Take 1 tablet by mouth every 6 (six) hours as needed. For pain    . MESALAMINE 400 MG PO TBEC Oral Take 400 mg by mouth 3 (three) times daily. For colitis    . METHOCARBAMOL 500 MG PO TABS Oral Take 500 mg by mouth  every 8 (eight) hours as needed. For muscle spasms    . SENNA-DOCUSATE SODIUM 8.6-50 MG PO TABS Oral Take 1 tablet by mouth daily as needed. For constipation    . SIMVASTATIN 80 MG PO TABS Oral Take 80 mg by mouth at bedtime.     Marland Kitchen TIOTROPIUM BROMIDE MONOHYDRATE 18 MCG IN CAPS Inhalation Place 18 mcg into inhaler and inhale daily.      . WARFARIN SODIUM 3 MG PO TABS Oral Take 1.5-3 mg by mouth daily. 1.5 mg (1/2 tab) alternating with 3 mg (1 tab) every other day - 1/2 tablet on 06/29/11    . ZALEPLON 10 MG PO CAPS Oral Take 10 mg by mouth at bedtime. For insomnia    . ENOXAPARIN SODIUM 100 MG/ML [place] SOLN Subcutaneous Inject 0.9 mLs (90 mg total) into the skin every 12 (twelve) hours. 10 Syringe 0  . IBUPROFEN 800 MG PO TABS Oral Take 1 tablet (800 mg total) by mouth 3 (three) times  daily. 21 tablet 0  . OXYCODONE-ACETAMINOPHEN 5-325 MG PO TABS Oral Take 2 tablets by mouth every 4 (four) hours as needed for pain. 15 tablet 0    BP 118/47  Pulse 75  Temp(Src) 98.3 F (36.8 C) (Oral)  Resp 15  SpO2 97%  Physical Exam  Constitutional: She is oriented to person, place, and time. She appears well-developed and well-nourished. No distress.  HENT:  Head: Normocephalic and atraumatic.  Mouth/Throat: Oropharynx is clear and moist. No oropharyngeal exudate.  Eyes: Conjunctivae are normal. Pupils are equal, round, and reactive to light.  Neck: Normal range of motion. Neck supple.  Cardiovascular: Normal rate, regular rhythm and normal heart sounds.   Pulmonary/Chest: Effort normal and breath sounds normal. No respiratory distress. She exhibits no tenderness.       No rash to her chest wall or back Decreased breath sounds right base  Abdominal: Soft.  Musculoskeletal: Normal range of motion. She exhibits no edema and no tenderness.  Neurological: She is alert and oriented to person, place, and time.  Skin: Skin is warm.    ED Course  Procedures (including critical care time)  Labs Reviewed  CBC - Abnormal; Notable for the following:    WBC 11.8 (*)    All other components within normal limits  DIFFERENTIAL - Abnormal; Notable for the following:    Neutro Abs 8.5 (*)    All other components within normal limits  BASIC METABOLIC PANEL - Abnormal; Notable for the following:    Glucose, Bld 133 (*)    GFR calc non Af Amer 67 (*)    GFR calc Af Amer 77 (*)    All other components within normal limits  PROTIME-INR - Abnormal; Notable for the following:    Prothrombin Time 18.3 (*)    All other components within normal limits  APTT  CARDIAC PANEL(CRET KIN+CKTOT+MB+TROPI)  CARDIAC PANEL(CRET KIN+CKTOT+MB+TROPI)   Dg Chest 2 View  06/30/2011  *RADIOLOGY REPORT*  Clinical Data: Chest pain, shortness of breath.  History of right lung cancer status post right lower  lobectomy  CHEST - 2 VIEW  Comparison: 04/02/2011  Findings: Evidence of right lower lobe ostomy noted. Diffusely increased interstitial lung markings noted, without focal pulmonary opacity.  Heart size is at upper limits of normal.  No new focal pulmonary opacity. Bones are osteopenic, which may mask subtle fracture.  No focal osseous abnormality.  Sclerotic probable bone island again noted in the proximal left humerus.  IMPRESSION: No acute cardiopulmonary process.  Evidence of right lower lobectomy again noted.  Original Report Authenticated By: Harrel Lemon, M.D.   Ct Angio Chest W/cm &/or Wo Cm  06/30/2011  *RADIOLOGY REPORT*  Clinical Data: Chest pain, shortness of breath, cough, history of lung cancer status post right lower lobectomy  CT ANGIOGRAPHY CHEST  Technique:  Multidetector CT imaging of the chest using the standard protocol during bolus administration of intravenous contrast. Multiplanar reconstructed images including MIPs were obtained and reviewed to evaluate the vascular anatomy.  Contrast: 80mL OMNIPAQUE IOHEXOL 300 MG/ML IV SOLN  Comparison: Chest radiographs dated 06/30/2011.  Gerri Spore Long CT chest dated 01/30/2011.  Findings: No evidence of pulmonary embolism.  Paraseptal emphysematous changes/subpleural reticulation.  Mild right upper lobe pleural thickening (series 3/image 23), grossly unchanged.  Status post right lower lobectomy.  No suspicious pulmonary nodules. No pleural effusion or pneumothorax.  Visualized thyroid is unremarkable.  The heart is top normal in size.  No pericardial effusion. Coronary atherosclerosis.  After calcifications of the aortic arch.  No suspicious mediastinal, hilar, or axillary lymphadenopathy.  Visualized upper abdomen is notable for a tiny left adrenal adenoma.  Mild degenerative changes of the visualized thoracolumbar spine.  IMPRESSION: No evidence of pulmonary embolism.  Status post right lower lobectomy.  No evidence of recurrent or metastatic  disease in the chest.  Original Report Authenticated By: Charline Bills, M.D.     1. Pleurisy       MDM  Pleuritic chest pain with history of lung cancer, pulmonary embolism, COPD and CHF. Atypical for ACS. Concern for PE.  Patient's subtherapeutic on her Coumadin. Lovenox injection given. CT PE negative for acute pulmonary embolism. No other explanation of patient's pain on scan.  Is atypical for ACS she has a negative set of cardiac enzymes. Consider herpes zoster but there is no rash chest wall or back.  Cardiac enzymes negative x2. Patient ambulatory in the hallway without difficulty.   Date: 06/30/2011  Rate: 81  Rhythm: normal sinus rhythm  QRS Axis: normal  Intervals: normal  ST/T Wave abnormalities: normal  Conduction Disutrbances:none  Narrative Interpretation:   Old EKG Reviewed: unchanged         Glynn Octave, MD 06/30/11 1807

## 2011-06-30 NOTE — ED Notes (Signed)
PT successfully completed lap around the department. Only complains of side pain.

## 2011-07-25 ENCOUNTER — Encounter: Payer: Self-pay | Admitting: *Deleted

## 2011-08-02 ENCOUNTER — Ambulatory Visit (HOSPITAL_COMMUNITY)
Admission: RE | Admit: 2011-08-02 | Discharge: 2011-08-02 | Disposition: A | Discharge status: Home / Self Care | Payer: Medicare Other | Source: Ambulatory Visit | Attending: Radiation Oncology | Admitting: Radiation Oncology

## 2011-08-02 DIAGNOSIS — R911 Solitary pulmonary nodule: Secondary | ICD-10-CM | POA: Insufficient documentation

## 2011-08-02 DIAGNOSIS — K449 Diaphragmatic hernia without obstruction or gangrene: Secondary | ICD-10-CM | POA: Insufficient documentation

## 2011-08-02 DIAGNOSIS — C3490 Malignant neoplasm of unspecified part of unspecified bronchus or lung: Secondary | ICD-10-CM | POA: Insufficient documentation

## 2011-08-02 DIAGNOSIS — K7689 Other specified diseases of liver: Secondary | ICD-10-CM | POA: Insufficient documentation

## 2011-08-02 DIAGNOSIS — I2789 Other specified pulmonary heart diseases: Secondary | ICD-10-CM | POA: Insufficient documentation

## 2011-08-08 ENCOUNTER — Encounter: Payer: Self-pay | Admitting: *Deleted

## 2011-08-08 DIAGNOSIS — J449 Chronic obstructive pulmonary disease, unspecified: Secondary | ICD-10-CM | POA: Insufficient documentation

## 2011-08-08 DIAGNOSIS — R0602 Shortness of breath: Secondary | ICD-10-CM | POA: Insufficient documentation

## 2011-08-08 DIAGNOSIS — Z86711 Personal history of pulmonary embolism: Secondary | ICD-10-CM | POA: Insufficient documentation

## 2011-08-09 ENCOUNTER — Telehealth: Payer: Self-pay | Admitting: *Deleted

## 2011-08-09 ENCOUNTER — Encounter: Payer: Self-pay | Admitting: Radiation Oncology

## 2011-08-09 ENCOUNTER — Ambulatory Visit
Admit: 2011-08-09 | Discharge: 2011-08-09 | Disposition: A | Discharge status: Home / Self Care | Payer: Medicare Other | Attending: Radiation Oncology | Admitting: Radiation Oncology

## 2011-08-09 VITALS — BP 123/72 | HR 68 | Temp 97.9°F | Wt 208.7 lb

## 2011-08-09 DIAGNOSIS — C3410 Malignant neoplasm of upper lobe, unspecified bronchus or lung: Secondary | ICD-10-CM

## 2011-08-09 NOTE — Progress Notes (Signed)
Sept 2012 Admitted for Diarrhea with Homero Fellers blood.  She reports the blood "poured" out.  Given Transfusion of Red Blood Cells  October 2012 Admitted for Pulmonary embolus to Left Lung to ICU then discharged to a [facility] - On anticoagulant therapy, then stopped.  Also received treatment for her CHF.  November 2012 admitted - 2nd Pulmonary Embolus - left Lung  January 2013 admitted for Pleurisy   Pain level 9 on scale of 0-10 - Lumbar Spine -   Reports SOB but not using supplemental O2 at home.  Today O2 sat 97% on RA

## 2011-08-09 NOTE — Telephone Encounter (Signed)
xxxx 

## 2011-08-09 NOTE — Progress Notes (Signed)
Radiation Oncology         [phone] ________________________________  Name: [name] MRN: [id number]  Date: 08/09/2011  DOB: Mar 26, 1931       Follow-Up Visit Note  CC: MOON,AMY, NP, NP  Norton Blizzard, MD  Diagnosis:   78 yo woman with clincal IA NSCLC  Interval Since Last Radiation:  9 months  Narrative . . . . . . . . The patient returns today for routine follow-up.  She has had admissions for GI bleed and pulmonary emboli since her last visit here.  She had a CT of the chest on 2/21 for follow-up.                              ALLERGIES:  is allergic to aspirin.  Meds. . . . . . . . . . . . Current Outpatient Prescriptions  Medication Sig Dispense Refill  . acetaminophen (TYLENOL) 650 MG CR tablet Take 650 mg by mouth every 8 (eight) hours as needed. For pain      . alendronate (FOSAMAX) 70 MG tablet Take 70 mg by mouth every 7 (seven) days. Take with a full glass of water on an empty stomach. On Tuesdays      . amLODipine-benazepril (LOTREL) 5-10 MG per capsule Take 1 capsule by mouth daily.       . cimetidine (TAGAMET) 200 MG tablet Take 200 mg by mouth daily.        . diphenhydramine-acetaminophen (TYLENOL PM) 25-500 MG TABS Take 1 tablet by mouth at bedtime as needed. For sleeplessness      . fenofibrate (TRICOR) 145 MG tablet Take 145 mg by mouth daily.       . furosemide (LASIX) 40 MG tablet Take 40 mg by mouth daily.      . Glucosamine-Chondroit-Vit C-Mn (GLUCOSAMINE 1500 COMPLEX PO) Take 750 mg by mouth daily.      . methocarbamol (ROBAXIN) 500 MG tablet Take 500 mg by mouth every 8 (eight) hours as needed. For muscle spasms      . sennosides-docusate sodium (SENOKOT-S) 8.6-50 MG tablet Take 1 tablet by mouth daily as needed. For constipation      . simvastatin (ZOCOR) 80 MG tablet Take 80 mg by mouth at bedtime.       Marland Kitchen tiotropium (SPIRIVA) 18 MCG inhalation capsule Place 18 mcg into inhaler and inhale daily.        Marland Kitchen warfarin (COUMADIN) 3 MG tablet Take  1.5-3 mg by mouth daily. 1.5 mg (1/2 tab) alternating with 3 mg (1 tab) every other day - 1/2 tablet on 06/29/11        Physical Findings. . .  weight is 208 lb 11.2 oz (94.666 kg). Her temperature is 97.9 F (36.6 C). Her blood pressure is 123/72 and her pulse is 68. Her oxygen saturation is 97%. .  She is in no acute distress.  Unlabored respiratory effort.  No significant changes.  Lab Findings . . . . .  Lab Results  Component Value Date   WBC 11.8* 06/30/2011   HGB 14.7 06/30/2011   HCT 43.3 06/30/2011   MCV 90.8 06/30/2011   PLT 199 06/30/2011    @LASTCHEM @   X-Ray Findings. . . . Ct Chest Wo Contrast  08/02/2011  *RADIOLOGY REPORT*  Clinical Data: Lung cancer.  CT CHEST WITHOUT CONTRAST  Technique:  Multidetector CT imaging of the chest was performed following the standard protocol  without IV contrast.  Comparison: 06/30/2011 and 01/30/2011.  Findings: No pathologically enlarged mediastinal or axillary lymph nodes.  Hilar regions are difficult to definitively evaluate without IV contrast.  Extensive atherosclerotic calcification of the arterial vasculature, including coronary arteries.  Pulmonary arteries are enlarged.  Heart size normal.  No pericardial effusion.  Small hiatal hernia.  There are postoperative changes of right lower lobectomy.  Mild scarring in the right upper lobe.  A 4 mm nodule in the lingula (image 29) is more prominent than on the prior study of 01/30/2011, in retrospect.  Focal subpleural airspace consolidation in the superior segment left lower lobe is new from prior studies. Probable mild subpleural fibrosis superimposed on emphysema.  Trace right pleural fluid.  Airway is otherwise unremarkable.  Incidental imaging of the upper abdomen shows a sub centimeter low attenuation lesion in the right hepatic lobe, likely present on prior studies.  Nodular thickening of the left adrenal gland appears unchanged.  No worrisome lytic or sclerotic lesions. Degenerative changes are  seen in the spine.  IMPRESSION:   1.  Lingular nodule is small in size but more prominent than on 08/30/2010, in retrospect. A malignant lesion is favored.  2.  Focal airspace consolidation in the superior segment left lower lobe is new from 06/30/2011 and therefore most consistent with an infectious or inflammatory etiology.  3.  Postoperative changes of right lower lobectomy.  4.  Extensive atherosclerotic calcification of the arterial vasculature.  Pulmonary arterial hypertension.   Original Report Authenticated By: Reyes Ivan, M.D.    Impression . . . . . . . The patient has recovered from the effects of radiation.  Her treated right upper lung cancer has improved.  She does have a possible new primary tumor in the left lung which is too small for biopsy or PET.  Plan . . . . . . . . . . . . We will obtain a short interval follow-up CT in 3 months and see her a few days after.  Hopefully, the new 4 mm nodule will have resolved.  If it is larger, we will discuss possible PET-CT.  _____________________________________  Artist Pais. Kathrynn Running, M.D.

## 2011-08-10 ENCOUNTER — Other Ambulatory Visit: Payer: Self-pay | Admitting: Medical Oncology

## 2011-08-10 DIAGNOSIS — C3410 Malignant neoplasm of upper lobe, unspecified bronchus or lung: Secondary | ICD-10-CM

## 2011-08-13 ENCOUNTER — Encounter: Payer: Self-pay | Admitting: Oncology

## 2011-08-13 ENCOUNTER — Ambulatory Visit (HOSPITAL_BASED_OUTPATIENT_CLINIC_OR_DEPARTMENT_OTHER): Payer: Medicare Other | Admitting: Oncology

## 2011-08-13 ENCOUNTER — Telehealth: Payer: Self-pay | Admitting: Oncology

## 2011-08-13 ENCOUNTER — Other Ambulatory Visit: Payer: Medicare Other

## 2011-08-13 VITALS — BP 150/67 | HR 85 | Temp 97.1°F | Ht 64.0 in | Wt 208.9 lb

## 2011-08-13 DIAGNOSIS — Z7901 Long term (current) use of anticoagulants: Secondary | ICD-10-CM

## 2011-08-13 DIAGNOSIS — C3410 Malignant neoplasm of upper lobe, unspecified bronchus or lung: Secondary | ICD-10-CM

## 2011-08-13 LAB — CBC WITH DIFFERENTIAL/PLATELET
Basophils Absolute: 0 10*3/uL (ref 0.0–0.1)
Eosinophils Absolute: 0.3 10*3/uL (ref 0.0–0.5)
HCT: 40 % (ref 34.8–46.6)
HGB: 13.5 g/dL (ref 11.6–15.9)
LYMPH%: 25.9 % (ref 14.0–49.7)
MCV: 92.2 fL (ref 79.5–101.0)
MONO#: 0.4 10*3/uL (ref 0.1–0.9)
MONO%: 4.9 % (ref 0.0–14.0)
NEUT#: 5.7 10*3/uL (ref 1.5–6.5)
NEUT%: 66 % (ref 38.4–76.8)
Platelets: 190 10*3/uL (ref 145–400)
RBC: 4.34 10*6/uL (ref 3.70–5.45)
WBC: 8.7 10*3/uL (ref 3.9–10.3)

## 2011-08-13 LAB — COMPREHENSIVE METABOLIC PANEL
Alkaline Phosphatase: 39 U/L (ref 39–117)
BUN: 31 mg/dL — ABNORMAL HIGH (ref 6–23)
CO2: 26 mEq/L (ref 19–32)
Creatinine, Ser: 1.46 mg/dL — ABNORMAL HIGH (ref 0.50–1.10)
Glucose, Bld: 173 mg/dL — ABNORMAL HIGH (ref 70–99)
Total Bilirubin: 0.3 mg/dL (ref 0.3–1.2)
Total Protein: 6.6 g/dL (ref 6.0–8.3)

## 2011-08-13 LAB — LACTATE DEHYDROGENASE: LDH: 127 U/L (ref 94–250)

## 2011-08-13 NOTE — Telephone Encounter (Signed)
appts made and printed for pt aom °

## 2011-08-13 NOTE — Progress Notes (Signed)
This office note has been dictated.  #[id number]

## 2011-08-13 NOTE — Progress Notes (Signed)
CC:   [name], M.D. Jo Deer, MD Jo Alken, NP [name], M.D.  PROBLEM LIST: 1. High-grade neuroendocrine lung cancer, small cell with areas of non-     small cell lung cancer, right lower lobe, with biopsy carried out on      04/16/2005, treated successfully with carboplatin and VP16 from                 05/07/2005 through 07/09/2005 and then right lower lobe lobectomy on     08/09/2005. 2. Squamous cell carcinoma of the right lower lobe T1 resected at the     time of surgery on 08/09/2005.  All resected lymph nodes were     negative. 3. Non small cell lung cancer of the right upper lobe detected in March     2012 with positive PET scan treated with stereotactic radiation     5000 cGy in 5 fractions from 10/17/2010 through 10/27/2010. 4. Lingular nodule noted on CT scan of the chest without IV contrast     on 08/02/2011.  This lesion may have been present on a prior CT     scan going back to 08/30/2010. 5. 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 6. Anticoagulation therapy with Coumadin. 7. History of diastolic congestive heart failure October 2012. 8. History of colitis and rectal bleeding most recently September     2012. 9. Degenerative disease of the spine status post decompression at L4-     S1 in May 2006. 10.Hypertension. 11.Peripheral vascular disease. 12.Dyslipidemia. 13.History of colonic polyps with high-grade dysplasia noted in a     tubular adenoma. 14.Hearing impairment requiring bilateral hearing aids since 2010. 15.History of a left frontal lobe lesion, possibly a meningioma, seen     on MRI of the head in November 2006 and May 2007. 16.Systolic ejection murmur. 17.Chronic obstructive pulmonary disease.  MEDICATIONS: 1. Fosamax 70 mg weekly. 2. Lotrel 5-10 mg daily. 3. Tagamet 200 mg daily. 4. TriCor 145 mg daily. 5. Lasix 40 mg daily. 6. Glucosamine 1500  complex take 750 mg p.o. daily. 7. Robaxin 500 mg every 8 hours as needed. 8. Percocet 1 every 4 hours as needed. 9. Senokot S one tablet daily for constipation. 10.Zocor 80 mg at bedtime. 11.Spiriva 18 mcg inhalation capsule daily. 12.Coumadin 3 mg alternating with 1.5 mg daily.  HISTORY:  [name] is seen today for her recurrent cancers of the lung.  [name] lives in [name] with her daughter's family.  Her daughter, [name], accompanies her today.  [name] was last seen by [place] on 04/16/2011.  It was noted in that visit that the patient had an admission to the hospital in September and again in October.  The September admission apparently was due to colitis with bleeding, requiring blood transfusions.  The patient had been seen by Dr. Jeani Hawking and undergone colonoscopy with biopsies.  She was treated with Cipro, Flagyl and Asacol.  Her symptoms apparently have resolved.  In October she was admitted with DVT of the left lower extremity, pulmonary emboli and diastolic congestive heart failure.  A proBNP was almost 6000.  The patient was anticoagulated.  Apparently she was on a ventilator in the ICU at one time.  She has made miraculous recovery from those events.  At the present time she seems to be doing fairly well.  She uses oxygen infrequently.  Her only complaints are some  back pain, pain in her knee and left arm.  She does have dyspnea on exertion. She gets around with a cane but denies any falls.  She denies cough, hemoptysis, any blood in her stools.  No history of fever.  All things considered, she is doing quite well.  PHYSICAL EXAMINATION:  Weight is 208.9 pounds as compared with 221 pounds back in July of 2012 but her weight is stable compared to the last time she was seen here on 04/16/2011.  Height 5 feet 4 inches, body surface area 2.07 meters squared.  Blood pressure 150/67.  Other vital signs are normal.  She is afebrile, in no respiratory  distress.  O2 saturation on room air at rest was 96%.  She is without oxygen today. She is not in a wheelchair.  She uses a cane in her right hand.  No scleral icterus.  Mouth and pharynx are benign.  No peripheral adenopathy palpable.  Lungs are surprisingly clear to percussion and auscultation.  Cardiac exam, regular rhythm with systolic ejection murmur.  Breasts are not examined.  The patient is due for mammograms and a bone density scan this Friday in [facility] March 8.  Abdomen is obese, nontender with no organomegaly, masses palpable.  Extremities, no peripheral edema in the ankles or legs which are puffy.  The patient does have pain and tenderness in the left mid humerus.  Apparently this pain was noted a few months ago and has been increasing in severity. Neurologic exam was grossly normal without focal findings.  LABORATORY DATA:  Today, white count 8.7, ANC 5.7, hemoglobin 13.5, hematocrit 40.0, platelets 190,000.  Chemistries today are pending. Chemistries from 04/16/2011 were normal except for a glucose of 115. LDH was 171.  IMAGING STUDIES: 1. PET scan from 09/07/2010 showed hypermetabolic right upper lobe     pulmonary nodule, most consistent with bronchogenic carcinoma.  SUV     max was 7.4.  There was hypermetabolic activity associated with the     left adrenal gland nodule.  This was favored to be a lipid poor     adenoma or hyperplasia. 2. MRI of the head with and without IV contrast on 10/04/2010 showed     no acute or metastatic intracranial abnormality.  There was stable     MRI appearance of the brain as compared with a prior study of     11/02/2005.  There were acute on chronic sphenoid sinus     inflammatory changes. 3. CT scan of the chest without IV contrast on 01/30/2011 showed     interval reduction in the size of the right lobe pulmonary nodule     without evidence of local recurrence.  No evidence for mediastinal     metastatic disease.  Left adrenal  adenoma was noted.  CT scan of     abdomen and pelvis with IV contrast on 02/22/2011 showed no acute     findings.  Hepatic steatosis was present.  The patient has had     previous hysterectomy.  Both adnexa were unremarkable.  No masses     or lymphadenopathy noted within the abdomen or pelvis. 4. CT angiogram of the chest on 06/30/2011 showed no evidence for     pulmonary embolism.  The patient with status post right lower     lobectomy with no evidence of recurrent or metastatic disease in     the chest. 5. CT scan of the chest without IV contrast on 08/02/2011 showed a  lingular nodule, small in size measuring 4 mm, more prominent than     on the prior study of 01/30/2011 in retrospect.  A malignant lesion     was favored.  There was focal air space consolidation in the     superior segment of the left lower lobe felt to be new compared     with the study of 06/30/2011, most consistent with infectious or     inflammatory etiology.  There were postoperative changes consistent     with a right lower lobectomy.  There was extensive atherosclerotic     calcification of the arterial vasculature and pulmonary arterial     hypertension.  IMPRESSION AND PLAN:  Ms. Gasner has a very complex history as can be noted.  She has had 3 documented lesions that have been malignant, 2 in the right lower lobe dating back to 2006 and actually may have had 1 or more of these cancers noted on a chest x-ray in retrospect in November 2004.  Her most recent documented cancer was the right upper lobe non- small cell lung cancer from March 2012 treated with stereotactic radiation.  There is now a lesion in the lingula that is being followed.  The patient and her daughter tell me that she will be having another CT scan of the chest scheduled for May of this year, and to be seen in followup by Dr. Margaretmary Bayley a few months later.  The patient is scheduled for mammogram and a bone density scan at [facility].  Because of the pain in her left mid humerus, we will go ahead and get a bone scan to rule out the possibility of metastatic disease to bone. Will plan to see Ms. Heiny again in 3 months at which time we will check CBC, chemistries and CEA.  This was an extended visit, taking 35-40 minutes to review the patient's previous hospital records from the autumn of 2012 and recent scans.    ______________________________ Samul Dada, M.D. DSM/MEDQ  D:  08/13/2011  T:  08/13/2011  Job:  [id number]

## 2011-08-20 ENCOUNTER — Encounter (HOSPITAL_COMMUNITY): Payer: Self-pay

## 2011-08-20 ENCOUNTER — Encounter (HOSPITAL_COMMUNITY)
Admission: RE | Admit: 2011-08-20 | Discharge: 2011-08-20 | Disposition: A | Discharge status: Home / Self Care | Payer: Medicare Other | Source: Ambulatory Visit | Attending: Oncology | Admitting: Oncology

## 2011-08-20 DIAGNOSIS — C3490 Malignant neoplasm of unspecified part of unspecified bronchus or lung: Secondary | ICD-10-CM | POA: Insufficient documentation

## 2011-08-20 DIAGNOSIS — M79609 Pain in unspecified limb: Secondary | ICD-10-CM | POA: Insufficient documentation

## 2011-08-20 DIAGNOSIS — C3410 Malignant neoplasm of upper lobe, unspecified bronchus or lung: Secondary | ICD-10-CM

## 2011-08-20 MED ORDER — TECHNETIUM TC 99M MEDRONATE IV KIT
24.0000 | PACK | Freq: Once | INTRAVENOUS | Status: AC | PRN
  Administered 2011-08-20: 24 via INTRAVENOUS

## 2011-09-14 ENCOUNTER — Telehealth: Payer: Self-pay | Admitting: Oncology

## 2011-09-14 NOTE — Telephone Encounter (Signed)
s/w pts daughter and r/s 6/4 appt to 5/30   aom

## 2011-10-31 ENCOUNTER — Ambulatory Visit (HOSPITAL_COMMUNITY)
Admission: RE | Admit: 2011-10-31 | Discharge: 2011-10-31 | Disposition: A | Discharge status: Home / Self Care | Payer: Medicare Other | Source: Ambulatory Visit | Attending: Radiation Oncology | Admitting: Radiation Oncology

## 2011-10-31 DIAGNOSIS — J438 Other emphysema: Secondary | ICD-10-CM | POA: Insufficient documentation

## 2011-10-31 DIAGNOSIS — R911 Solitary pulmonary nodule: Secondary | ICD-10-CM | POA: Insufficient documentation

## 2011-10-31 DIAGNOSIS — I700 Atherosclerosis of aorta: Secondary | ICD-10-CM | POA: Insufficient documentation

## 2011-10-31 DIAGNOSIS — I080 Rheumatic disorders of both mitral and aortic valves: Secondary | ICD-10-CM | POA: Insufficient documentation

## 2011-10-31 DIAGNOSIS — K449 Diaphragmatic hernia without obstruction or gangrene: Secondary | ICD-10-CM | POA: Insufficient documentation

## 2011-10-31 DIAGNOSIS — C3410 Malignant neoplasm of upper lobe, unspecified bronchus or lung: Secondary | ICD-10-CM | POA: Insufficient documentation

## 2011-10-31 DIAGNOSIS — J984 Other disorders of lung: Secondary | ICD-10-CM | POA: Insufficient documentation

## 2011-10-31 DIAGNOSIS — Z902 Acquired absence of lung [part of]: Secondary | ICD-10-CM | POA: Insufficient documentation

## 2011-10-31 DIAGNOSIS — I2510 Atherosclerotic heart disease of native coronary artery without angina pectoris: Secondary | ICD-10-CM | POA: Insufficient documentation

## 2011-10-31 DIAGNOSIS — Z923 Personal history of irradiation: Secondary | ICD-10-CM | POA: Insufficient documentation

## 2011-11-08 ENCOUNTER — Encounter: Payer: Self-pay | Admitting: Oncology

## 2011-11-08 ENCOUNTER — Ambulatory Visit (HOSPITAL_BASED_OUTPATIENT_CLINIC_OR_DEPARTMENT_OTHER): Payer: Medicare Other | Admitting: Oncology

## 2011-11-08 ENCOUNTER — Telehealth: Payer: Self-pay | Admitting: Oncology

## 2011-11-08 ENCOUNTER — Other Ambulatory Visit (HOSPITAL_BASED_OUTPATIENT_CLINIC_OR_DEPARTMENT_OTHER): Payer: Medicare Other | Admitting: Lab

## 2011-11-08 VITALS — BP 130/64 | HR 97 | Temp 97.8°F | Ht 64.0 in | Wt 212.6 lb

## 2011-11-08 DIAGNOSIS — C3410 Malignant neoplasm of upper lobe, unspecified bronchus or lung: Secondary | ICD-10-CM

## 2011-11-08 DIAGNOSIS — C7A090 Malignant carcinoid tumor of the bronchus and lung: Secondary | ICD-10-CM

## 2011-11-08 DIAGNOSIS — C3430 Malignant neoplasm of lower lobe, unspecified bronchus or lung: Secondary | ICD-10-CM

## 2011-11-08 DIAGNOSIS — I2699 Other pulmonary embolism without acute cor pulmonale: Secondary | ICD-10-CM

## 2011-11-08 LAB — CBC WITH DIFFERENTIAL/PLATELET
BASO%: 0.5 % (ref 0.0–2.0)
Eosinophils Absolute: 0.3 10*3/uL (ref 0.0–0.5)
HCT: 45.6 % (ref 34.8–46.6)
LYMPH%: 25.9 % (ref 14.0–49.7)
MCHC: 32.9 g/dL (ref 31.5–36.0)
MONO#: 0.8 10*3/uL (ref 0.1–0.9)
NEUT#: 6.9 10*3/uL — ABNORMAL HIGH (ref 1.5–6.5)
NEUT%: 63.4 % (ref 38.4–76.8)
Platelets: 221 10*3/uL (ref 145–400)
WBC: 10.9 10*3/uL — ABNORMAL HIGH (ref 3.9–10.3)
lymph#: 2.8 10*3/uL (ref 0.9–3.3)
nRBC: 0 % (ref 0–0)

## 2011-11-08 LAB — COMPREHENSIVE METABOLIC PANEL
ALT: 12 U/L (ref 0–35)
AST: 16 U/L (ref 0–37)
Albumin: 4.7 g/dL (ref 3.5–5.2)
BUN: 26 mg/dL — ABNORMAL HIGH (ref 6–23)
CO2: 28 mEq/L (ref 19–32)
Calcium: 10.1 mg/dL (ref 8.4–10.5)
Chloride: 99 mEq/L (ref 96–112)
Creatinine, Ser: 1.23 mg/dL — ABNORMAL HIGH (ref 0.50–1.10)
Potassium: 4.4 mEq/L (ref 3.5–5.3)

## 2011-11-08 LAB — LACTATE DEHYDROGENASE: LDH: 122 U/L (ref 94–250)

## 2011-11-08 NOTE — Telephone Encounter (Signed)
Gave pt appt for September 2013 lab and MD 

## 2011-11-08 NOTE — Progress Notes (Signed)
CC:   [name], M.D. Foye Deer, MD [name], NP Oneita Hurt, M.D.   PROBLEM LIST:  1. 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  2. Squamous cell carcinoma of the right lower lobe T1 resected at the  time of surgery on 08/09/2005. All resected lymph nodes were  negative.  3. Non small cell lung cancer of the right upper lobe detected in March  2012, +NABx 09/21/10, with positive PET scan treated with stereotactic  radiation 5000 cGy in 5 fractions from 10/17/2010 through 10/27/2010.  4. Lingular nodule which appears to be increasing in size.  This lesion may have been present on a CT scan of the chest going back to 08/30/2010.  5. 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  6. Anticoagulation therapy with Coumadin.  7. History of diastolic congestive heart failure October 2012.  8. History of colitis and rectal bleeding most recently September  2012.  9. Degenerative disease of the spine status post decompression at L4-  S1 in May 2006.  10.Hypertension.  11.Peripheral vascular disease.  12.Dyslipidemia.  13.History of colonic polyps with high-grade dysplasia noted in a  tubular adenoma.  14.Hearing impairment requiring bilateral hearing aids since 2010.  15.History of a left frontal lobe lesion, possibly a meningioma, seen  on MRI of the head in November 2006 and May 2007.  16.Systolic ejection murmur.  17.Chronic obstructive pulmonary disease.   MEDICATIONS:  1. Fosamax 70 mg weekly.  2. Lotrel 5-10 mg daily.  3. Tagamet 200 mg daily.  4. TriCor 145 mg daily.  5. Lasix 40 mg daily.  6. Glucosamine 1500 complex take 750 mg p.o. daily.  7.  Robaxin 500 mg every 8 hours as needed.  8. Percocet 1 every 4 hours as needed.  9. Senokot S one tablet daily for constipation.  10.Zocor 80 mg at bedtime.  11.Spiriva 18 mcg inhalation capsule daily.  12.Coumadin 3 mg alternating with 1.5 mg daily.   HISTORY:  I saw Pamlea Finder today for followup of her recurrent cancers involving the lung.  Ms. Casagrande was last seen by [place] on 08/13/2011.  She is here today with her daughter, Mariana Kaufman.  The patient denies any major changes from her last visit here 2 months ago.  She has been using oxygen a little bit more frequently several times a week. Apparently, her need for oxygen and her dyspnea on exertion increases with the temperature.  The patient gets around with a cane. Occasionally, she will fall.  She has difficulties with her knees due to arthritis.  The pain that she was having in her left arm has improved. There is no hemoptysis or any other significant changes in her condition.  She did undergo a bone scan on 08/20/2011 that was negative. CT scan of the chest without IV contrast carried out on 10/31/2011 suggests continued interval enlargement of the nodule in the lingula now measuring 5 mm.  PHYSICAL EXAMINATION:  General:  There was little change.  Weight is 212.6 pounds, height 5 feet 4 inches, body surface area 2.09 sq m. Vital Signs:  Blood pressure 130/64.  Other vital signs are normal.  O2 saturation on the last visit on room air was 96%.  The patient did not bring her oxygen with her today.  There is no scleral icterus.  Mouth and pharynx are benign.  There is no peripheral adenopathy palpable. Lungs:  Clear to percussion and auscultation.  Cardiac:  Regular rhythm. I did not hear a systolic ejection murmur today.  Breasts:  Not examined.  Abdomen:  Obese, nontender, with no organomegaly masses palpable.  Extremities:  No peripheral edema or clubbing.  Neurologic: Exam was nonfocal.  LABORATORY DATA:  Today, white  count 10.9, ANC 6.9, hemoglobin 15.0, hematocrit 45.6, platelets 221,000.  Chemistries and CEA are pending. Chemistries from 08/13/2011 were normal.  Chemistries on 08/13/2011 were notable for a BUN of 31, creatinine 1.46, and glucose of 173. Chemistries from today are pending.  On 09/28/2011, BUN was 22 creatinine 0.82.  Thus, the patient may have some degree of renal insufficiency.  IMAGING STUDIES:  1. PET scan from 09/07/2010 showed hypermetabolic right upper lobe  pulmonary nodule, most consistent with bronchogenic carcinoma. SUV  max was 7.4. There was hypermetabolic activity associated with the  left adrenal gland nodule. This was favored to be a lipid poor  adenoma or hyperplasia.  2. MRI of the head with and without IV contrast on 10/04/2010 showed  no acute or metastatic intracranial abnormality. There was stable  MRI appearance of the brain as compared with a prior study of  11/02/2005. There were acute on chronic sphenoid sinus  inflammatory changes.  3. CT scan of the chest without IV contrast on 01/30/2011 showed  interval reduction in the size of the right lobe pulmonary nodule  without evidence of local recurrence. No evidence for mediastinal  metastatic disease. Left adrenal adenoma was noted. CT scan of  abdomen and pelvis with IV contrast on 02/22/2011 showed no acute  findings. Hepatic steatosis was present. The patient has had  previous hysterectomy. Both adnexa were unremarkable. No masses  or lymphadenopathy noted within the abdomen or pelvis.  4. CT angiogram of the chest on 06/30/2011 showed no evidence for  pulmonary embolism. The patient with status post right lower  lobectomy with no evidence of recurrent or metastatic disease in  the chest.  5. CT scan of the chest without IV contrast on 08/02/2011 showed a  lingular nodule, small in size measuring 4 mm, more prominent than  on the prior study of 01/30/2011 in retrospect. A malignant lesion  was favored.  There was focal air space consolidation in the  superior segment of the left lower lobe felt to be new compared  with the study of 06/30/2011, most consistent with infectious or  inflammatory etiology. There were postoperative changes consistent  with a right lower lobectomy. There was extensive atherosclerotic  calcification of the arterial vasculature and pulmonary arterial  hypertension. 6. Bone scan carried out on 08/20/2011 showed no evidence for bone     metastases.  There was increased radio tracer uptake within the     posterior aspect of the lumbar spine consistent with chronic post-     surgical change. 7. CT scan of the chest without IV contrast on 10/31/2011 showed     continued interval enlargement of a now 5 mm nodule in the lingula     seen on image 27 of series 5 suspicious for a small malignant     nodule.  Continued followup was suggested.  It was felt that this     lesion was below the resolution of a PET scan and too small to  biopsy.  The patient was noted to be status post right lower     lobectomy.  There was also extensive atherosclerosis involving the     thoracic aorta, great vessels in the mediastinum and the coronary     arteries including calcified atherosclerotic plaque in the left     main, left anterior descending, left circumflex, and right coronary     arteries.  There was also extensive calcification of the aortic     valve and mitral annulus.  There was a small hiatal hernia.  There     were no pathologically enlarged mediastinal or hilar lymph nodes.  IMPRESSION AND PLAN:  Mrs.  Mezo seemed seems to be clinically stable. There was concern about the nodule seen in the lingula.  I am also concerned about the patient's respiratory status which may be somewhat worse than it was a couple of months ago.  I have suggested to the patient and her daughter that she see a pulmonary specialist to see if management of her pulmonary status can be optimized.   The patient clearly has COPD.  Also, in view of the CT scan findings, a copy of which was given to the patient and her daughter, it may be prudent for the patient to see her cardiologist.  Apparently, Dr. Nanetta Batty had seen Mrs. Tesch previously.  The patient has an appointment to see Dr. Kathrynn Running again in late August 2013.  We will plan to see the patient again in 4 months which will be late September at which time we will check CBC and chemistries.  As noted above, the lesion in the left lingula is too small for any further diagnostic interventions.  I doubt that the patient would be a surgical candidate in view of her underlying pulmonary disease.  We will continue to monitor this lesion.  At some point, the lesion may be amenable to biopsy and stereotactic radiation.    ______________________________ Samul Dada, M.D. DSM/MEDQ  D:  11/08/2011  T:  11/08/2011  Job:  [id number]

## 2011-11-08 NOTE — Progress Notes (Signed)
This office note has been dictated.  #[id number]

## 2011-11-13 ENCOUNTER — Other Ambulatory Visit: Payer: Self-pay | Admitting: Medical Oncology

## 2011-11-13 ENCOUNTER — Ambulatory Visit: Payer: Medicare Other | Admitting: Oncology

## 2011-11-13 ENCOUNTER — Other Ambulatory Visit: Payer: Medicare Other | Admitting: Lab

## 2011-11-13 DIAGNOSIS — C3410 Malignant neoplasm of upper lobe, unspecified bronchus or lung: Secondary | ICD-10-CM

## 2011-11-13 DIAGNOSIS — J449 Chronic obstructive pulmonary disease, unspecified: Secondary | ICD-10-CM

## 2011-11-16 ENCOUNTER — Encounter: Payer: Self-pay | Admitting: Oncology

## 2011-11-16 NOTE — Progress Notes (Signed)
CT scan of the chest without IV contrast carried out on 10/31/2011 was reviewed with the radiologist. The 5 mm nodule seen in the lingular on the present exam was also present on the CT scan of 08/02/2011 but seemed to be smaller. Review of CT scans from 03/26/2011 and 08/30/2010 did not show this lesion.  The lesion therefore is highly suspect for being malignant, possibly a new primary tumor. The currently the lesion is too small for biopsy or PET scan. We will need to repeat the CT scan in a few months.  The patient has an appointment to see Dr. Marcelyn Bruins on 12/11/2011. She has an appointment to see Dr. Margaretmary Bayley on 02/07/2012 and to see me on 03/10/2012.

## 2011-12-11 ENCOUNTER — Institutional Professional Consult (permissible substitution): Payer: Medicare Other | Admitting: Pulmonary Disease

## 2011-12-25 ENCOUNTER — Telehealth: Payer: Self-pay | Admitting: *Deleted

## 2011-12-25 NOTE — Telephone Encounter (Signed)
CALLED PATIENT TO ALTER FU VISIT FOR 02-07-12 TO ACCOMADATE IMPLANT, RESCHEDULED FOR 02-07-12 AT 2:00 PM, PATIENT AGREED TO NEW TIME ON 02-07-12

## 2012-02-07 ENCOUNTER — Telehealth: Payer: Self-pay | Admitting: *Deleted

## 2012-02-07 ENCOUNTER — Ambulatory Visit
Admission: RE | Admit: 2012-02-07 | Discharge: 2012-02-07 | Disposition: A | Discharge status: Home / Self Care | Payer: Medicare Other | Source: Ambulatory Visit | Attending: Radiation Oncology | Admitting: Radiation Oncology

## 2012-02-07 ENCOUNTER — Encounter: Payer: Self-pay | Admitting: Radiation Oncology

## 2012-02-07 ENCOUNTER — Ambulatory Visit: Payer: Medicare Other | Admitting: Radiation Oncology

## 2012-02-07 VITALS — BP 119/52 | HR 67 | Temp 98.3°F | Wt 220.0 lb

## 2012-02-07 DIAGNOSIS — M549 Dorsalgia, unspecified: Secondary | ICD-10-CM | POA: Insufficient documentation

## 2012-02-07 DIAGNOSIS — Z79899 Other long term (current) drug therapy: Secondary | ICD-10-CM | POA: Insufficient documentation

## 2012-02-07 DIAGNOSIS — C3410 Malignant neoplasm of upper lobe, unspecified bronchus or lung: Secondary | ICD-10-CM

## 2012-02-07 DIAGNOSIS — Z510 Encounter for antineoplastic radiation therapy: Secondary | ICD-10-CM | POA: Insufficient documentation

## 2012-02-07 NOTE — Progress Notes (Signed)
Patient here for routine follow up post radiation right upper lung radiation in May 2012.Patient has been having pain for 2 weeks of lower back which has been moving up back. Currently takes percocet which is not effective.Last ct of chest in May 2013.

## 2012-02-07 NOTE — Progress Notes (Signed)
  Radiation Oncology         [phone] ________________________________  Name: [name] MRN: [id number]  Date: 02/07/2012  DOB: December 17, 1930  Follow-Up Visit Note  CC: [name], NP  [name], MD  Diagnosis:   78 yo woman with clincal IA NSCLC of the right upper lobe s/p SBRT in May 2012.  Interval Since Last Radiation:  15 months  Narrative:  The patient returns today for routine follow-up.  He has some lower T-spine pain for the past 2 weeks.                              ALLERGIES:  is allergic to aspirin.  Meds: Current Outpatient Prescriptions  Medication Sig Dispense Refill  . acetaminophen (TYLENOL) 650 MG CR tablet Take 650 mg by mouth every 8 (eight) hours as needed. For pain      . alendronate (FOSAMAX) 70 MG tablet Take 70 mg by mouth every 7 (seven) days. Take with a full glass of water on an empty stomach. On Tuesdays      . amLODipine-benazepril (LOTREL) 5-10 MG per capsule Take 1 capsule by mouth daily.       . cimetidine (TAGAMET) 200 MG tablet Take 200 mg by mouth daily.        . diphenhydramine-acetaminophen (TYLENOL PM) 25-500 MG TABS Take 1 tablet by mouth at bedtime as needed. For sleeplessness      . fenofibrate (TRICOR) 145 MG tablet Take 134 mg by mouth daily.       . furosemide (LASIX) 40 MG tablet Take 40 mg by mouth daily.      . Glucosamine-Chondroit-Vit C-Mn (GLUCOSAMINE 1500 COMPLEX PO) Take 750 mg by mouth daily.      . methocarbamol (ROBAXIN) 500 MG tablet Take 500 mg by mouth every 8 (eight) hours as needed. For muscle spasms      . oxyCODONE-acetaminophen (PERCOCET) 5-325 MG per tablet Take 1 tablet by mouth every 4 (four) hours as needed.      . sennosides-docusate sodium (SENOKOT-S) 8.6-50 MG tablet Take 1 tablet by mouth daily as needed. For constipation      . simvastatin (ZOCOR) 80 MG tablet Take 80 mg by mouth at bedtime.       Marland Kitchen tiotropium (SPIRIVA) 18 MCG inhalation capsule Place 18 mcg into inhaler and inhale daily.        Marland Kitchen  warfarin (COUMADIN) 3 MG tablet Take by mouth daily. Take 1/2 pill Sat and Sun, take whole pill weekdays        Physical Findings: The patient is in no acute distress. Patient is alert and oriented.  weight is 220 lb (99.791 kg). Her temperature is 98.3 F (36.8 C). Her blood pressure is 119/52 and her pulse is 67. Her oxygen saturation is 95%. . Lungs clear.  Pain localized to about T12 more toward left side. No significant changes.  Impression:  The patient is stable with some new back pain.  She is due for lung cancer follow-up imaging.  Plan:  Chest CT now and return to review the results.  Hopefully this will show that her treated tumor is controlled and possibly shed some light on the new back pain etiology.  _____________________________________  Artist Pais Kathrynn Running, M.D.

## 2012-02-07 NOTE — Telephone Encounter (Signed)
CALLED PATIENT TO INFORM OF TEST AND FU VISIT, LVM FOR A RETURN CALL 

## 2012-02-12 ENCOUNTER — Telehealth: Payer: Self-pay | Admitting: *Deleted

## 2012-02-12 NOTE — Telephone Encounter (Signed)
Called patient to inform of test, lvm for a return call 

## 2012-02-14 ENCOUNTER — Ambulatory Visit (HOSPITAL_COMMUNITY)
Admission: RE | Admit: 2012-02-14 | Discharge: 2012-02-14 | Disposition: A | Discharge status: Home / Self Care | Payer: Medicare Other | Source: Ambulatory Visit | Attending: Radiation Oncology | Admitting: Radiation Oncology

## 2012-02-14 DIAGNOSIS — C3490 Malignant neoplasm of unspecified part of unspecified bronchus or lung: Secondary | ICD-10-CM | POA: Insufficient documentation

## 2012-02-14 DIAGNOSIS — J438 Other emphysema: Secondary | ICD-10-CM | POA: Insufficient documentation

## 2012-02-14 DIAGNOSIS — I7789 Other specified disorders of arteries and arterioles: Secondary | ICD-10-CM | POA: Insufficient documentation

## 2012-02-14 DIAGNOSIS — E279 Disorder of adrenal gland, unspecified: Secondary | ICD-10-CM | POA: Insufficient documentation

## 2012-02-14 DIAGNOSIS — R059 Cough, unspecified: Secondary | ICD-10-CM | POA: Insufficient documentation

## 2012-02-14 DIAGNOSIS — R911 Solitary pulmonary nodule: Secondary | ICD-10-CM | POA: Insufficient documentation

## 2012-02-14 DIAGNOSIS — R0602 Shortness of breath: Secondary | ICD-10-CM | POA: Insufficient documentation

## 2012-02-14 DIAGNOSIS — I700 Atherosclerosis of aorta: Secondary | ICD-10-CM | POA: Insufficient documentation

## 2012-02-14 DIAGNOSIS — R05 Cough: Secondary | ICD-10-CM | POA: Insufficient documentation

## 2012-02-14 DIAGNOSIS — C3410 Malignant neoplasm of upper lobe, unspecified bronchus or lung: Secondary | ICD-10-CM

## 2012-02-14 DIAGNOSIS — K7689 Other specified diseases of liver: Secondary | ICD-10-CM | POA: Insufficient documentation

## 2012-02-19 DIAGNOSIS — Z923 Personal history of irradiation: Secondary | ICD-10-CM | POA: Insufficient documentation

## 2012-02-21 ENCOUNTER — Ambulatory Visit: Admission: RE | Admit: 2012-02-21 | Payer: Medicare Other | Source: Ambulatory Visit | Admitting: Radiation Oncology

## 2012-03-06 ENCOUNTER — Encounter: Payer: Self-pay | Admitting: Radiation Oncology

## 2012-03-06 ENCOUNTER — Telehealth: Payer: Self-pay | Admitting: *Deleted

## 2012-03-06 ENCOUNTER — Ambulatory Visit
Admission: RE | Admit: 2012-03-06 | Discharge: 2012-03-06 | Disposition: A | Discharge status: Home / Self Care | Payer: Medicare Other | Source: Ambulatory Visit | Attending: Radiation Oncology | Admitting: Radiation Oncology

## 2012-03-06 DIAGNOSIS — C3410 Malignant neoplasm of upper lobe, unspecified bronchus or lung: Secondary | ICD-10-CM

## 2012-03-06 NOTE — Telephone Encounter (Signed)
CALLED PATIENT TO INFORM OF TEST AND FU VISIT, LVM FOR A RETURN CALL 

## 2012-03-06 NOTE — Progress Notes (Signed)
Radiation Oncology         [phone] ________________________________  Name: [name] MRN: [id number]  Date: 03/06/2012  DOB: Jul 05, 1930  Follow-Up Visit Note  CC: [name], NP  [name], MD  Diagnosis:   78 yo woman with clincal IA NSCLC of the right upper lobe s/p SBRT in May 2012  Interval Since Last Radiation:  16 months  Narrative:  The patient returns today for routine follow-up.  She had chest CT which shows an enlarging lingular nodule at 12 mm.                              ALLERGIES:  is allergic to aspirin.  Meds: Current Outpatient Prescriptions  Medication Sig Dispense Refill  . acetaminophen (TYLENOL) 650 MG CR tablet Take 650 mg by mouth every 8 (eight) hours as needed. For pain      . alendronate (FOSAMAX) 70 MG tablet Take 70 mg by mouth every 7 (seven) days. Take with a full glass of water on an empty stomach. On Tuesdays      . amLODipine-benazepril (LOTREL) 5-10 MG per capsule Take 1 capsule by mouth daily.       . cimetidine (TAGAMET) 200 MG tablet Take 200 mg by mouth daily.        . diphenhydramine-acetaminophen (TYLENOL PM) 25-500 MG TABS Take 1 tablet by mouth at bedtime as needed. For sleeplessness      . fenofibrate (TRICOR) 145 MG tablet Take 134 mg by mouth daily.       . furosemide (LASIX) 40 MG tablet Take 40 mg by mouth daily.      . Glucosamine-Chondroit-Vit C-Mn (GLUCOSAMINE 1500 COMPLEX PO) Take 750 mg by mouth daily.      . methocarbamol (ROBAXIN) 500 MG tablet Take 500 mg by mouth every 8 (eight) hours as needed. For muscle spasms      . oxyCODONE-acetaminophen (PERCOCET) 5-325 MG per tablet Take 1 tablet by mouth every 4 (four) hours as needed.      . sennosides-docusate sodium (SENOKOT-S) 8.6-50 MG tablet Take 1 tablet by mouth daily as needed. For constipation      . simvastatin (ZOCOR) 80 MG tablet Take 80 mg by mouth at bedtime.       Marland Kitchen tiotropium (SPIRIVA) 18 MCG inhalation capsule Place 18 mcg into inhaler and inhale  daily.        Marland Kitchen warfarin (COUMADIN) 3 MG tablet Take by mouth daily. Take 1/2 pill Sat and Sun, take whole pill weekdays        Physical Findings: The patient is in no acute distress. Patient is alert and oriented. Lungs clear with distant breath sounds.  No significant changes.  Radiographic Findings: Ct Chest Wo Contrast  02/14/2012  *RADIOLOGY REPORT*  Clinical Data: Lung cancer, status post SBRT right upper lobe.  New localized pain to approximately T12, eccentric left.  Cough. Shortness of breath.  CT CHEST WITHOUT CONTRAST  Technique:  Multidetector CT imaging of the chest was performed following the standard protocol without IV contrast.  Comparison: 10/31/2011  Findings: Lung windows demonstrate right lower lobectomy. Redemonstration of moderate centrilobular emphysema and patchy radiation changes in the periphery of the right upper lobe.  Further interval enlargement of the lingular nodule.  This measures maximally 1.2 cm on image 29 versus 5 mm on the prior exam.  Soft tissue windows demonstrate aortic atherosclerosis and tortuosity of the descending segment.  Normal  heart size without pericardial or pleural effusion.  Multivessel coronary artery atherosclerosis.  Calcifications involving the mitral valve apparatus.  Aortic valve calcifications as well.  Pulmonary artery enlargement, with outflow tract measuring 3.9 cm.  Stable small middle mediastinal nodes.  No definite hilar adenopathy, given limitation of unenhanced CT.  Prevascular nodes are also similar and not pathologic by size criteria.  Limited abdominal imaging demonstrates moderate hepatic steatosis. A suspicion of hepatomegaly, incompletely imaged.  Mild left adrenal nodularity which is similar back to 01/30/2011, favoring a benign etiology.  No acute osseous abnormality.  IMPRESSION:  1. No acute process or explanation for left-sided pain. 2.  Enlargement of the lingular nodule, consistent with neoplasm. Favor a metachronous primary  bronchogenic carcinoma.  Isolated metastasis felt less likely. 3. Status post right lower lobectomy.  No evidence of metastatic disease.  Mild left adrenal nodularity is unchanged and favored to be benign. 4. Pulmonary artery enlargement suggests pulmonary arterial hypertension. 5.  Hepatic steatosis and probable hepatomegaly.   Original Report Authenticated By: Consuello Bossier, M.D.     Impression:  The patient likely has a new clinical stage I bronchogenic carcinoma and needs further work-up.  Plan:  PET now, then discuss in [facility] for CT-biopsy and SBRT versus surgery.  _____________________________________  Artist Pais Kathrynn Running, M.D.

## 2012-03-06 NOTE — Telephone Encounter (Signed)
CALLED PATIENT TO INFORM OF APPT. WITH DR. HENDRICKSON ON 03-18-12- ARRIVAL TIME - 1:15 PM FOR 1:30 APPT., LVM FOR A RETURN CALL

## 2012-03-06 NOTE — Progress Notes (Signed)
F/urad txs:10/17/2010,5/10, 5/14,5/16,and 5/18,2012 right upper lobe lung Patient here alert,oriented x3, uses cane to help with unsteadiness, patient sob with exertion, pain in her lower back right side, back stated 96% room air sats, Last ct chest 02/14/12 results

## 2012-03-10 ENCOUNTER — Other Ambulatory Visit: Payer: Self-pay

## 2012-03-10 ENCOUNTER — Other Ambulatory Visit: Payer: Medicare Other | Admitting: Lab

## 2012-03-10 ENCOUNTER — Ambulatory Visit: Payer: Medicare Other | Admitting: Oncology

## 2012-03-12 ENCOUNTER — Telehealth: Payer: Self-pay | Admitting: Oncology

## 2012-03-12 NOTE — Telephone Encounter (Signed)
pt had missed 9/30 but is having a pet on 10/4,r/s to 10/4     aom

## 2012-03-13 ENCOUNTER — Ambulatory Visit: Admission: RE | Admit: 2012-03-13 | Payer: Medicare Other | Source: Ambulatory Visit | Admitting: Radiation Oncology

## 2012-03-14 ENCOUNTER — Other Ambulatory Visit (HOSPITAL_BASED_OUTPATIENT_CLINIC_OR_DEPARTMENT_OTHER): Payer: Medicare Other | Admitting: Lab

## 2012-03-14 ENCOUNTER — Other Ambulatory Visit: Payer: Self-pay | Admitting: Radiation Oncology

## 2012-03-14 ENCOUNTER — Ambulatory Visit (HOSPITAL_BASED_OUTPATIENT_CLINIC_OR_DEPARTMENT_OTHER): Payer: Medicare Other | Admitting: Oncology

## 2012-03-14 ENCOUNTER — Encounter: Payer: Self-pay | Admitting: Oncology

## 2012-03-14 ENCOUNTER — Telehealth: Payer: Self-pay | Admitting: Oncology

## 2012-03-14 ENCOUNTER — Ambulatory Visit (HOSPITAL_COMMUNITY)
Admission: RE | Admit: 2012-03-14 | Discharge: 2012-03-14 | Disposition: A | Discharge status: Home / Self Care | Payer: Medicare Other | Source: Ambulatory Visit | Attending: Radiation Oncology | Admitting: Radiation Oncology

## 2012-03-14 VITALS — BP 139/64 | HR 78 | Temp 96.7°F | Resp 20 | Ht 64.0 in | Wt 218.4 lb

## 2012-03-14 DIAGNOSIS — D3500 Benign neoplasm of unspecified adrenal gland: Secondary | ICD-10-CM | POA: Insufficient documentation

## 2012-03-14 DIAGNOSIS — Z902 Acquired absence of lung [part of]: Secondary | ICD-10-CM | POA: Insufficient documentation

## 2012-03-14 DIAGNOSIS — C3490 Malignant neoplasm of unspecified part of unspecified bronchus or lung: Secondary | ICD-10-CM | POA: Insufficient documentation

## 2012-03-14 DIAGNOSIS — M8708 Idiopathic aseptic necrosis of bone, other site: Secondary | ICD-10-CM | POA: Insufficient documentation

## 2012-03-14 DIAGNOSIS — C3410 Malignant neoplasm of upper lobe, unspecified bronchus or lung: Secondary | ICD-10-CM

## 2012-03-14 DIAGNOSIS — C7A1 Malignant poorly differentiated neuroendocrine tumors: Secondary | ICD-10-CM

## 2012-03-14 DIAGNOSIS — R911 Solitary pulmonary nodule: Secondary | ICD-10-CM | POA: Insufficient documentation

## 2012-03-14 DIAGNOSIS — C7B8 Other secondary neuroendocrine tumors: Secondary | ICD-10-CM

## 2012-03-14 LAB — CBC WITH DIFFERENTIAL/PLATELET
BASO%: 0.5 % (ref 0.0–2.0)
EOS%: 3.5 % (ref 0.0–7.0)
MCH: 30.8 pg (ref 25.1–34.0)
MCHC: 33.2 g/dL (ref 31.5–36.0)
RBC: 4.83 10*6/uL (ref 3.70–5.45)
RDW: 13.7 % (ref 11.2–14.5)
lymph#: 2.9 10*3/uL (ref 0.9–3.3)

## 2012-03-14 LAB — COMPREHENSIVE METABOLIC PANEL (CC13)
ALT: 15 U/L (ref 0–55)
AST: 21 U/L (ref 5–34)
Albumin: 3.9 g/dL (ref 3.5–5.0)
Calcium: 9.7 mg/dL (ref 8.4–10.4)
Chloride: 102 mEq/L (ref 98–107)
Potassium: 4 mEq/L (ref 3.5–5.1)

## 2012-03-14 LAB — GLUCOSE, CAPILLARY: Glucose-Capillary: 135 mg/dL — ABNORMAL HIGH (ref 70–99)

## 2012-03-14 MED ORDER — FLUDEOXYGLUCOSE F - 18 (FDG) INJECTION: 16.2000 | Freq: Once | INTRAVENOUS | Status: AC | PRN

## 2012-03-14 NOTE — Telephone Encounter (Signed)
appts made and printed for pt aom °

## 2012-03-14 NOTE — Progress Notes (Signed)
CC:   Oneita Hurt, M.D. [name], M.D. [name], NP Foye Deer, MD  PROBLEM LIST:  1. 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  2. Squamous cell carcinoma of the right lower lobe T1 resected at the  time of surgery on 08/09/2005. All resected lymph nodes were  negative.  3. Non small cell lung cancer of the right upper lobe detected in March  2012, +NABx 09/21/10, with positive PET scan treated with stereotactic  radiation 5000 cGy in 5 fractions from 10/17/2010 through 10/27/2010.  4. Lingular nodule which appears to be increasing in size. This  lesion may have been present on a CT scan of the chest going back  to 08/30/2010.  5. 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  6. Anticoagulation therapy with Coumadin.  7. History of diastolic congestive heart failure October 2012.  8. History of colitis and rectal bleeding most recently September  2012.  9. Degenerative disease of the spine status post decompression at L4-  S1 in May 2006.  10.Hypertension.  11.Peripheral vascular disease.  12.Dyslipidemia.  13.History of colonic polyps with high-grade dysplasia noted in a  tubular adenoma.  14.Hearing impairment requiring bilateral hearing aids since 2010.  15.History of a left frontal lobe lesion, possibly a meningioma, seen  on MRI of the head in November 2006 and May 2007.  16.Systolic ejection murmur.  17.Chronic obstructive pulmonary disease.    MEDICATIONS:  1. Fosamax 70 mg weekly.  2. Lotrel 5-10 mg daily.  3. Tagamet 200 mg daily.  4. TriCor 145 mg daily.  5. Lasix 40 mg daily.  6. Glucosamine 1500 complex take 750 mg p.o. daily.  7.  Robaxin 500 mg every 8 hours as needed.  8. Percocet 1 every 4 hours as needed,usually 2 daily for back pain.  9. Senokot S one tablet daily for constipation.  10. Zocor 80 mg at bedtime.  11. Spiriva 18 mcg inhalation capsule daily. 12. Coumadin 3 mg daily 5 days a week, 1.5 mg on Saturday and Sunday. 13. Omega-3 (Lovaza) 2 g daily.  Pneumovax was given October 2012. Last flu shot was October 2012.  The patient will be receiving a flu shot within the next couple of weeks.  SMOKING HISTORY:  The patient smoked up to a pack and a half of cigarettes a day for over 50 years.  She stopped smoking in June of 2005.  HISTORY:  Petronia Joice was seen today for followup of her recurrent cancers involving the lung.  Ms. Marius was last seen by [place] on 11/08/2011.  She is here with her daughter, Mariana Kaufman.  The patient is in a wheelchair.  Her back pain is apparently causing more problems.  She has to take Percocet twice a day.  She is not as active around the house as she used to be.  She uses a cane.  She is here today in a wheelchair.  She uses oxygen once or twice a month as needed, usually 2 L per minute for dyspnea on exertion.  There have been no major changes in the patient's condition.  She has seen Dr. Kathrynn Running recently and had a CT scan of the chest on 02/14/2012 that showed progression of  the left lung nodule.  The patient had a PET scan today.  PHYSICAL EXAMINATION:  There are no major changes.  Weight is 218.4 pounds.  Height 5 feet 4 inches.  Body surface area 2.11 m2.  Blood pressure 139/64.  Other vital signs are normal.  O2 saturation on room air at rest was 97%.  There is no scleral icterus.  Mouth and pharynx are benign.  No peripheral adenopathy palpable.  Lungs were clear to percussion and auscultation.  Cardiac exam regular rhythm with soft systolic ejection murmur.  Breasts were not examined.  Abdomen with the patient sitting is obese, nontender with no organomegaly  or masses palpable.  Extremities:  No peripheral edema or clubbing.  Neurologic exam was nonfocal.  No Port-A-Cath or central catheter is present.  LABORATORY DATA:  Today, white count 8.7, ANC 4.9, hemoglobin 14.9, hematocrit 44.9, platelets 200,000.  Chemistries were normal except for a glucose of 113.  CEA was 0.6 on 11/08/2011.  IMAGING STUDIES:  1. PET scan from 09/07/2010 showed hypermetabolic right upper lobe  pulmonary nodule, most consistent with bronchogenic carcinoma. SUV  max was 7.4. There was hypermetabolic activity associated with the  left adrenal gland nodule. This was favored to be a lipid poor  adenoma or hyperplasia.  2. MRI of the head with and without IV contrast on 10/04/2010 showed  no acute or metastatic intracranial abnormality. There was stable  MRI appearance of the brain as compared with a prior study of  11/02/2005. There were acute on chronic sphenoid sinus  inflammatory changes.  3. CT scan of the chest without IV contrast on 01/30/2011 showed  interval reduction in the size of the right lobe pulmonary nodule  without evidence of local recurrence. No evidence for mediastinal  metastatic disease. Left adrenal adenoma was noted. CT scan of  abdomen and pelvis with IV contrast on 02/22/2011 showed no acute  findings. Hepatic steatosis was present. The patient has had  previous hysterectomy. Both adnexa were unremarkable. No masses  or lymphadenopathy noted within the abdomen or pelvis.  4. CT angiogram of the chest on 06/30/2011 showed no evidence for  pulmonary embolism. The patient with status post right lower  lobectomy with no evidence of recurrent or metastatic disease in  the chest.  5. CT scan of the chest without IV contrast on 08/02/2011 showed a  lingular nodule, small in size measuring 4 mm, more prominent than  on the prior study of 01/30/2011 in retrospect. A malignant lesion  was favored. There was focal air space consolidation in the    superior segment of the left lower lobe felt to be new compared  with the study of 06/30/2011, most consistent with infectious or  inflammatory etiology. There were postoperative changes consistent  with a right lower lobectomy. There was extensive atherosclerotic  calcification of the arterial vasculature and pulmonary arterial  hypertension.  6. Bone scan carried out on 08/20/2011 showed no evidence for bone  metastases. There was increased radio tracer uptake within the  posterior aspect of the lumbar spine consistent with chronic post-  surgical change.  7. CT scan of the chest without IV contrast on 10/31/2011 showed  continued interval enlargement of a now 5 mm nodule in the lingula  seen on image 27 of series 5 suspicious for a small malignant  nodule. Continued followup was suggested. It was felt that this  lesion was below the resolution of a PET scan and too small to  biopsy. The patient was  noted to be status post right lower  lobectomy. There was also extensive atherosclerosis involving the  thoracic aorta, great vessels in the mediastinum and the coronary  arteries including calcified atherosclerotic plaque in the left  main, left anterior descending, left circumflex, and right coronary  arteries. There was also extensive calcification of the aortic  valve and mitral annulus. There was a small hiatal hernia. There  were no pathologically enlarged mediastinal or hilar lymph nodes. 8. CT scan of chest without IV contrast on 02/14/2012 showed enlargement of the lingular nodule consistent with neoplasm.  There was pulmonary artery enlargement suggesting pulmonary arterial hypertension.  Hepatic steatosis and probable hepatomegaly were noted. 9. PET scan on 03/14/2012 showed a 9 x 13 mm lingular nodule with maximum SUV of 3.9, suspicious for primary bronchogenic neoplasm.  A metachronous primary was favored.  There was no evidence for metastatic disease.  The patient was  status post right lower lobectomy.   IMPRESSION AND PLAN:  Clinically Ms. Pelot seems to be stable except for her back problems which do not seem to be cancer related.  Once again, the CT scan of the chest from 02/14/2012 and the PET scan from today are most suggestive of a malignant tumor in the lingula, probably a new primary.  The patient apparently has seen Dr. Nelson Chimes who has given her cardiac clearance.  On the other hand the patient has advanced COPD and has a history of pulmonary emboli most recently in October 2012 and prior to that in October of 2006.  She is on Coumadin at this time.  I doubt that the patient would be a surgical candidate given her underlying problems and particularly her age of 7, soon to be 89 on 11/18.  I will be in touch with Dr. Kathrynn Running to get his thoughts about further stereotactic radiation treatments.  The patient did rather well with that regarding the lesion in the right lower lobe that turned out to be a squamous cell carcinoma.  To date the patient has not had a biopsy of this new lesion and that will need to be considered before treatment.  I have asked Ms. Bohlman to return in about 6 weeks which will be somewhere around 11/15, at which time she can see Norina Buzzard.  We will check CBC and chemistries at that time.    ______________________________ Samul Dada, M.D. DSM/MEDQ  D:  03/14/2012  T:  03/14/2012  Job:  [id number]

## 2012-03-14 NOTE — Progress Notes (Signed)
This office note has been dictated.  #[id number]

## 2012-03-18 ENCOUNTER — Institutional Professional Consult (permissible substitution) (INDEPENDENT_AMBULATORY_CARE_PROVIDER_SITE_OTHER): Payer: Medicare Other | Admitting: Thoracic Surgery (Cardiothoracic Vascular Surgery)

## 2012-03-18 ENCOUNTER — Encounter: Payer: Self-pay | Admitting: Radiation Oncology

## 2012-03-18 VITALS — BP 136/62 | HR 76 | Resp 20 | Ht 64.5 in | Wt 218.0 lb

## 2012-03-18 DIAGNOSIS — R911 Solitary pulmonary nodule: Secondary | ICD-10-CM

## 2012-03-18 DIAGNOSIS — Z85118 Personal history of other malignant neoplasm of bronchus and lung: Secondary | ICD-10-CM

## 2012-03-18 NOTE — Progress Notes (Signed)
PCP is MOON,AMY, NP Referring Provider is Oneita Hurt, MD  Chief Complaint  Patient presents with  . Lung Lesion    Referral from Dr Kathrynn Running for surgical eval on new pulmonary nodule with HX of lung cancer, PET Scan 03/14/12    HPI: [name] is a 78 year old woman who presents with a chief complaint of a lung cancer.  [name] is known to our practice having previously been treated by Dr. Edwyna Shell, she is a new patient to me. 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 She was treated with chemotherapy and then underwent a right lower lobectomy. She did well until March of 2012 when a CT scan showed a new right upper lobe lesion. This turned out to be non-small cell cancer and she was treated with stereotactic radiation by Dr. Kathrynn Running. During a recent followup CT she was found to have a left lingular nodule approximately a centimeter in diameter. She had on 03/14/2012 which showed this lesion to be hypermetabolic.  [name] says she doesn't have any problem with her heart and was cleared by Dr. Allyson Sabal for lung surgery. She states that she would get short of breath walking up 4-5 stairs to get into her house and with walking a very short distance on level ground. She did get short of breath walking from the waiting room to the exam room. She does also have a history of DVT and PE, most recently in 2012 and is on Coumadin for that.   Past Medical History  Diagnosis Date  . Cancer     lung ca, RUL  . CHF (congestive heart failure)   . Hypertension   . COPD (chronic obstructive pulmonary disease)   . Hx pulmonary embolism 03/2011    on coumadin till 02/2011,stopped  due to gi bleed  . GERD (gastroesophageal reflux disease)   . GI bleed   . Colitis     history  . Shortness of breath   . Chest pain 06/30/11    hx blood clot in lung  . Bell's palsy   . PVD (peripheral vascular  disease)   . Lung cancer     metachronous stage I nscca RUL  . History of radiation therapy 5/8/10th,14th,16th,and 10/27/2010    RUL lung nscca  . Blood transfusion     during lumbar surgery  . History of chemotherapy 2006-207    carboplatin/VP16 last January 207  . Clotting disorder     DVT/ PE    Past Surgical History  Procedure Date  . Right lower lobe lung resection 2007  . Cataract surgery     b/l  lens implants  . Abdominal hysterectomy     partial age 51  . Shoulder open rotator cuff repair 04/2003    left  . Appendectomy   . Carpal tunnel release   . Bladder surgery   . Lumbar spine surgery     x3, L4-S1  . Right lung biopsy 2006  . Coronary angioplasty with stent placement 2001    renal stent  . Colonoscopies 2011    polyp removal     Family History  Problem Relation Age of Onset  . Breast cancer Mother   . Uterine cancer Sister   . Lung cancer Sister   . Breast cancer Maternal Grandmother   . Colon cancer Sister   . Uterine cancer Sister  Social History History  Substance Use Topics  . Smoking status: Former Smoker -- 1.5 packs/day for 54 years    Types: Cigarettes  . Smokeless tobacco: Not on file  . Alcohol Use: No    Current Outpatient Prescriptions  Medication Sig Dispense Refill  . acetaminophen (TYLENOL) 650 MG CR tablet Take 650 mg by mouth every 8 (eight) hours as needed. For pain      . alendronate (FOSAMAX) 70 MG tablet Take 70 mg by mouth every 7 (seven) days. Take with a full glass of water on an empty stomach. On Tuesdays      . amLODipine-benazepril (LOTREL) 5-10 MG per capsule Take 1 capsule by mouth daily.       . cimetidine (TAGAMET) 200 MG tablet Take 200 mg by mouth daily.        . diphenhydramine-acetaminophen (TYLENOL PM) 25-500 MG TABS Take 2 tablets by mouth at bedtime as needed. For sleeplessness      . fenofibrate (TRICOR) 145 MG tablet Take 134 mg by mouth daily.       . furosemide (LASIX) 40 MG tablet Take 40 mg by  mouth daily.      . Glucosamine-Chondroit-Vit C-Mn (GLUCOSAMINE 1500 COMPLEX PO) Take 750 mg by mouth daily.      . methocarbamol (ROBAXIN) 500 MG tablet Take 500 mg by mouth every 8 (eight) hours as needed. For muscle spasms      . omega-3 acid ethyl esters (LOVAZA) 1 G capsule Take 2 g by mouth daily.      Marland Kitchen oxyCODONE-acetaminophen (PERCOCET) 5-325 MG per tablet Take 1 tablet by mouth every 4 (four) hours as needed.      . sennosides-docusate sodium (SENOKOT-S) 8.6-50 MG tablet Take 1 tablet by mouth daily as needed. For constipation      . simvastatin (ZOCOR) 80 MG tablet Take 80 mg by mouth at bedtime.       Marland Kitchen tiotropium (SPIRIVA) 18 MCG inhalation capsule Place 18 mcg into inhaler and inhale daily.        Marland Kitchen warfarin (COUMADIN) 3 MG tablet Take by mouth daily. Take 1/2 pill Sat and Sun, take whole pill weekdays        Allergies  Allergen Reactions  . Aspirin Other (See Comments)    Stomach pain    Review of Systems  Constitutional: Negative for fever, activity change (sedentary for a long time), appetite change and unexpected weight change.  Respiratory: Positive for cough, shortness of breath and wheezing.   Cardiovascular: Positive for leg swelling. Negative for chest pain.  Musculoskeletal: Positive for back pain and arthralgias.  All other systems reviewed and are negative.    BP 136/62  Pulse 76  Resp 20  Ht 5' 4.5" (1.638 m)  Wt 218 lb (98.884 kg)  BMI 36.84 kg/m2  SpO2 93% Physical Exam  Vitals reviewed. Constitutional: She is oriented to person, place, and time. No distress.       Morbidly obese  HENT:  Head: Normocephalic and atraumatic.  Eyes: EOM are normal. Pupils are equal, round, and reactive to light.  Neck: Neck supple. No thyromegaly present.  Cardiovascular:  Murmur (2/6 systolic) heard.      Irregular rhythm  Pulmonary/Chest: She has wheezes (faint bilateral).       Diminished BS bilaterally  Abdominal: Soft.       obese  Musculoskeletal: She  exhibits no edema.  Lymphadenopathy:    She has no cervical adenopathy.  Neurological: She is alert and oriented  to person, place, and time. No cranial nerve deficit.  Skin: Skin is warm and dry.     Diagnostic Tests: CT chest CT CHEST WITHOUT CONTRAST  Technique: Multidetector CT imaging of the chest was performed  following the standard protocol without IV contrast.  Comparison: 10/31/2011  Findings: Lung windows demonstrate right lower lobectomy.  Redemonstration of moderate centrilobular emphysema and patchy  radiation changes in the periphery of the right upper lobe.  Further interval enlargement of the lingular nodule. This measures  maximally 1.2 cm on image 29 versus 5 mm on the prior exam.  Soft tissue windows demonstrate aortic atherosclerosis and  tortuosity of the descending segment. Normal heart size without  pericardial or pleural effusion. Multivessel coronary artery  atherosclerosis. Calcifications involving the mitral valve  apparatus. Aortic valve calcifications as well.  Pulmonary artery enlargement, with outflow tract measuring 3.9 cm.  Stable small middle mediastinal nodes. No definite hilar  adenopathy, given limitation of unenhanced CT. Prevascular nodes  are also similar and not pathologic by size criteria.  Limited abdominal imaging demonstrates moderate hepatic steatosis.  A suspicion of hepatomegaly, incompletely imaged. Mild left  adrenal nodularity which is similar back to 01/30/2011, favoring a  benign etiology.  No acute osseous abnormality.  IMPRESSION:  1. No acute process or explanation for left-sided pain.  2. Enlargement of the lingular nodule, consistent with neoplasm.  Favor a metachronous primary bronchogenic carcinoma. Isolated  metastasis felt less likely.  3. Status post right lower lobectomy. No evidence of metastatic  disease. Mild left adrenal nodularity is unchanged and favored to  be benign.  4. Pulmonary artery enlargement  suggests pulmonary arterial  hypertension.  5. Hepatic steatosis and probable hepatomegaly.   PET/CT 03/14/12 *RADIOLOGY REPORT*  Clinical Data: Subsequent treatment strategy for lung cancer. New  solitary pulmonary nodule.  NUCLEAR MEDICINE PET SKULL BASE TO THIGH  Fasting Blood Glucose: 135  Technique: 16.2 mCi F-18 FDG was injected intravenously. CT data  was obtained and used for attenuation correction and anatomic  localization only. (This was not acquired as a diagnostic CT  examination.) Additional exam technical data entered on  technologist worksheet.  Comparison: CT chest dated 02/14/2012. PET CT dated 09/07/2010.  Findings:  Neck: No hypermetabolic lymph nodes in the neck.  Chest: 9 x 13 mm lingular nodule (series 2/image 83), max SUV 3.9,  suspicious for primary bronchogenic neoplasm.  Status post right lower lobectomy. Emphysematous changes.  No hypermetabolic mediastinal or hilar nodes.  Abdomen/Pelvis: No abnormal hypermetabolic activity within the  liver, pancreas, adrenal glands, or spleen.  No hypermetabolic lymph nodes in the abdomen or pelvis.  Left adrenal adenoma. Infrarenal abdominal aortic stent.  Skeleton: No focal hypermetabolic activity to suggest skeletal  metastasis.  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 Prior lower lumbar  spine fixation.  IMPRESSION:  9 x 13 mm lingular nodule, max SUV 3.9, suspicious for primary  bronchogenic neoplasm. When correlating with prior studies, a  metachronous primary is favored.  No evidence of metastatic disease.  Status post right lower lobectomy.  Impression: [name] is a 78 year old woman with a new 1 cm, hypermetabolic nodule in the lingula of the left upper lobe. This almost certainly represents a non-small cell lung cancer.  She has a complex history with synchronous neuroendocrine and non-small cell tumors back in 2007 which were treated successfully with a combination of chemotherapy and surgery.  She had a right lower lobectomy at that time. She subsequently developed a right upper lobe mass  which was treated with stereotactic radiation last year.   She presented with a pre-conceived notion of proceeding with surgery at all costs. She stated that she "would rather die on the table than suffer with cancer". I tried to explain to her that surgery was not the only option for treatment of this lesion and that radiation would be an alternative.  She has multiple comorbidities including morbid obesity, COPD, deconditioning, history of DVT and pulmonary embolism with evidence of pulmonary hypertension, as well as the aforementioned surgery and radiation to the right lung. She is not an operative candidate.  In my opinion she would be best served by stereotactic radiation to the lingular nodule, just as was done to the right upper lobe nodule last year. She has an appointment to see Dr. Kathrynn Running later this week. She will discuss these issues further with him. I will defer to Dr. Kathrynn Running whether he wants a biopsy prior to treating the lesion  Plan: Followup with Dr. Kathrynn Running and Dr. Arline Asp

## 2012-03-20 ENCOUNTER — Encounter: Payer: Self-pay | Admitting: Radiation Oncology

## 2012-03-20 ENCOUNTER — Ambulatory Visit
Admission: RE | Admit: 2012-03-20 | Discharge: 2012-03-20 | Disposition: A | Discharge status: Home / Self Care | Payer: Medicare Other | Source: Ambulatory Visit | Attending: Radiation Oncology | Admitting: Radiation Oncology

## 2012-03-20 VITALS — BP 123/59 | HR 84 | Temp 97.9°F | Resp 22 | Wt 220.8 lb

## 2012-03-20 DIAGNOSIS — Z923 Personal history of irradiation: Secondary | ICD-10-CM

## 2012-03-20 DIAGNOSIS — Z86711 Personal history of pulmonary embolism: Secondary | ICD-10-CM

## 2012-03-20 DIAGNOSIS — J449 Chronic obstructive pulmonary disease, unspecified: Secondary | ICD-10-CM

## 2012-03-20 DIAGNOSIS — R0602 Shortness of breath: Secondary | ICD-10-CM

## 2012-03-20 DIAGNOSIS — C3410 Malignant neoplasm of upper lobe, unspecified bronchus or lung: Secondary | ICD-10-CM

## 2012-03-20 DIAGNOSIS — C3490 Malignant neoplasm of unspecified part of unspecified bronchus or lung: Secondary | ICD-10-CM
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
## 2012-03-20 NOTE — Addendum Note (Signed)
Encounter addended by: Oneita Hurt, MD on: 03/20/2012 11:29 AM<BR>     Documentation filed: Normajean Glasgow VN

## 2012-03-20 NOTE — Progress Notes (Signed)
Radiation Oncology         [phone]) [phone] ________________________________  Name: [name] MRN: [id number]  Date: 03/20/2012  DOB: 31-Jan-1931  Follow-Up Visit Note  CC: [name], NP  [name], MD  Diagnosis:   78 yo woman with clincal IA NSCLC of the right upper lobe s/p SBRT in May 2012   Interval Since Last Radiation: 16 months  Narrative:  The patient returns today for routine follow-up.  She saw Dr. Dorris Fetch.  He talked to her about surgery, and they decided that she was not an ideal surgical candidate.                              ALLERGIES:  is allergic to aspirin.  Meds: Current Outpatient Prescriptions  Medication Sig Dispense Refill  . acetaminophen (TYLENOL) 650 MG CR tablet Take 650 mg by mouth every 8 (eight) hours as needed. For pain      . alendronate (FOSAMAX) 70 MG tablet Take 70 mg by mouth every 7 (seven) days. Take with a full glass of water on an empty stomach. On Tuesdays      . amLODipine-benazepril (LOTREL) 5-10 MG per capsule Take 1 capsule by mouth daily.       . cimetidine (TAGAMET) 200 MG tablet Take 200 mg by mouth daily.        . diphenhydramine-acetaminophen (TYLENOL PM) 25-500 MG TABS Take 2 tablets by mouth at bedtime as needed. For sleeplessness      . fenofibrate (TRICOR) 145 MG tablet Take 134 mg by mouth daily.       . furosemide (LASIX) 40 MG tablet Take 40 mg by mouth daily.      . Glucosamine-Chondroit-Vit C-Mn (GLUCOSAMINE 1500 COMPLEX PO) Take 750 mg by mouth daily.      . methocarbamol (ROBAXIN) 500 MG tablet Take 500 mg by mouth every 8 (eight) hours as needed. For muscle spasms      . omega-3 acid ethyl esters (LOVAZA) 1 G capsule Take 2 g by mouth daily.      Marland Kitchen oxyCODONE-acetaminophen (PERCOCET) 5-325 MG per tablet Take 1 tablet by mouth every 4 (four) hours as needed.      . sennosides-docusate sodium (SENOKOT-S) 8.6-50 MG tablet Take 1 tablet by mouth daily as needed. For constipation      . simvastatin (ZOCOR) 80 MG  tablet Take 80 mg by mouth at bedtime.       Marland Kitchen tiotropium (SPIRIVA) 18 MCG inhalation capsule Place 18 mcg into inhaler and inhale daily.        Marland Kitchen warfarin (COUMADIN) 3 MG tablet Take by mouth daily. Take 1/2 pill Sat and Sun, take whole pill weekdays        Physical Findings: The patient is in no acute distress. Patient is alert and oriented.  weight is 220 lb 12.8 oz (100.154 kg). Her oral temperature is 97.9 F (36.6 C). Her blood pressure is 123/59 and her pulse is 84. Her respiration is 22. .  No significant changes.  Lab Findings: Lab Results  Component Value Date   WBC 8.7 03/14/2012   HGB 14.9 03/14/2012   HCT 44.9 03/14/2012   MCV 93.0 03/14/2012   PLT 200 03/14/2012    @LASTCHEM @  Radiographic Findings: Nm Pet Image Restag (ps) Skull Base To Thigh  03/14/2012  *RADIOLOGY REPORT*  Clinical Data: Subsequent treatment strategy for lung cancer. New solitary pulmonary nodule.  NUCLEAR MEDICINE PET  SKULL BASE TO THIGH  Fasting Blood Glucose:  135  Technique:  16.2 mCi F-18 FDG was injected intravenously. CT data was obtained and used for attenuation correction and anatomic localization only.  (This was not acquired as a diagnostic CT examination.) Additional exam technical data entered on technologist worksheet.  Comparison:  CT chest dated 02/14/2012.  PET CT dated 09/07/2010.  Findings:  Neck: No hypermetabolic lymph nodes in the neck.  Chest:  9 x 13 mm lingular nodule (series 2/image 83), max SUV 3.9, suspicious for primary bronchogenic neoplasm.  Status post right lower lobectomy.  Emphysematous changes.  No hypermetabolic mediastinal or hilar nodes.  Abdomen/Pelvis:  No abnormal hypermetabolic activity within the liver, pancreas, adrenal glands, or spleen.  No hypermetabolic lymph nodes in the abdomen or pelvis.  Left adrenal adenoma.  Infrarenal abdominal aortic stent.  Skeleton:  No focal hypermetabolic activity to suggest skeletal metastasis.  Bone infarct in the left proximal  humerus.  Prior lower lumbar spine fixation.  IMPRESSION: 9 x 13 mm lingular nodule, max SUV 3.9, suspicious for primary bronchogenic neoplasm.  When correlating with prior studies, a metachronous primary is favored.  No evidence of metastatic disease.  Status post right lower lobectomy.   Original Report Authenticated By: Charline Bills, M.D.     Impression:  The patient has a likely new small primary bronchogenic carcinoma.  She is not suited for surgery.  Plan:  I will order CT-guided biopsy, and then discuss SBRT with her.  _____________________________________  Artist Pais. Kathrynn Running, M.D.

## 2012-03-20 NOTE — Patient Instructions (Signed)
Call if you have any questions.

## 2012-03-24 ENCOUNTER — Encounter: Payer: Self-pay | Admitting: Radiation Oncology

## 2012-03-25 ENCOUNTER — Telehealth: Payer: Self-pay | Admitting: *Deleted

## 2012-03-25 NOTE — Telephone Encounter (Signed)
Per Dr Broadus John instruction, spoke w/pt's daughter, Mariana Kaufman re: stop anticoagulants on 03/28/12. Daughter states she "has calendar marked to stop med on 03/27/12" as instructed by [name], NP in Dr [name] office, pt's PCP. Daughter states she was further instructed to begin pt's Coumadin the afternoon of biopsy day.

## 2012-03-27 ENCOUNTER — Encounter (HOSPITAL_COMMUNITY): Payer: Self-pay | Admitting: Pharmacy Technician

## 2012-03-27 ENCOUNTER — Other Ambulatory Visit: Payer: Self-pay | Admitting: Radiology

## 2012-04-01 ENCOUNTER — Ambulatory Visit (HOSPITAL_COMMUNITY)
Admission: RE | Admit: 2012-04-01 | Discharge: 2012-04-01 | Disposition: A | Discharge status: Home / Self Care | Payer: Medicare Other | Source: Ambulatory Visit | Attending: Radiation Oncology | Admitting: Radiation Oncology

## 2012-04-01 ENCOUNTER — Encounter (HOSPITAL_COMMUNITY): Payer: Self-pay

## 2012-04-01 ENCOUNTER — Ambulatory Visit (HOSPITAL_COMMUNITY)
Admission: RE | Admit: 2012-04-01 | Discharge: 2012-04-01 | Disposition: A | Discharge status: Home / Self Care | Payer: Medicare Other | Source: Ambulatory Visit | Attending: Interventional Radiology | Admitting: Interventional Radiology

## 2012-04-01 ENCOUNTER — Encounter (HOSPITAL_COMMUNITY)
Admission: RE | Admit: 2012-04-01 | Discharge: 2012-04-01 | Disposition: A | Discharge status: Home / Self Care | Payer: Medicare Other | Source: Ambulatory Visit | Attending: Radiation Oncology | Admitting: Radiation Oncology

## 2012-04-01 VITALS — BP 118/50 | HR 75 | Temp 98.0°F | Resp 18 | Ht 64.5 in | Wt 220.0 lb

## 2012-04-01 DIAGNOSIS — J449 Chronic obstructive pulmonary disease, unspecified: Secondary | ICD-10-CM | POA: Insufficient documentation

## 2012-04-01 DIAGNOSIS — Z923 Personal history of irradiation: Secondary | ICD-10-CM

## 2012-04-01 DIAGNOSIS — J4489 Other specified chronic obstructive pulmonary disease: Secondary | ICD-10-CM | POA: Insufficient documentation

## 2012-04-01 DIAGNOSIS — R911 Solitary pulmonary nodule: Secondary | ICD-10-CM | POA: Insufficient documentation

## 2012-04-01 DIAGNOSIS — C3490 Malignant neoplasm of unspecified part of unspecified bronchus or lung: Secondary | ICD-10-CM

## 2012-04-01 DIAGNOSIS — R0602 Shortness of breath: Secondary | ICD-10-CM

## 2012-04-01 DIAGNOSIS — Z86711 Personal history of pulmonary embolism: Secondary | ICD-10-CM

## 2012-04-01 DIAGNOSIS — C3410 Malignant neoplasm of upper lobe, unspecified bronchus or lung: Secondary | ICD-10-CM

## 2012-04-01 HISTORY — DX: Malignant neoplasm of unspecified part of unspecified bronchus or lung: C34.90

## 2012-04-01 LAB — CBC
MCH: 30.6 pg (ref 26.0–34.0)
MCV: 91.9 fL (ref 78.0–100.0)
Platelets: 203 10*3/uL (ref 150–400)
RBC: 4.83 MIL/uL (ref 3.87–5.11)
RDW: 13.6 % (ref 11.5–15.5)

## 2012-04-01 MED ORDER — SODIUM CHLORIDE 0.9 % IV SOLN
INTRAVENOUS | Status: DC
  Administered 2012-04-01: 08:00:00 via INTRAVENOUS

## 2012-04-01 MED ORDER — MIDAZOLAM HCL 2 MG/2ML IJ SOLN
INTRAMUSCULAR | Status: AC | PRN
  Administered 2012-04-01: 0.5 mg via INTRAVENOUS

## 2012-04-01 MED ORDER — HYDROCODONE-ACETAMINOPHEN 5-325 MG PO TABS
1.0000 | ORAL_TABLET | ORAL | Status: DC | PRN
  Administered 2012-04-01: 1 via ORAL
  Filled 2012-04-01: qty 1
  Filled 2012-04-01: qty 2

## 2012-04-01 MED ORDER — FENTANYL CITRATE 0.05 MG/ML IJ SOLN
INTRAMUSCULAR | Status: AC | PRN
  Administered 2012-04-01: 50 ug via INTRAVENOUS

## 2012-04-01 MED ORDER — MIDAZOLAM HCL 2 MG/2ML IJ SOLN
INTRAMUSCULAR | Status: AC
  Filled 2012-04-01: qty 4

## 2012-04-01 MED ORDER — FENTANYL CITRATE 0.05 MG/ML IJ SOLN
INTRAMUSCULAR | Status: AC
  Filled 2012-04-01: qty 4

## 2012-04-01 NOTE — H&P (Signed)
[name] is an 78 y.o. female.   Chief Complaint: left lung mass WUJ:[id number] with history of right lung cancer (NSC/NEUROENDOCRINE) 2007, recurrence in 2012, now with new 1 cm hypermetabolic nodule in lingula of left upper lobe presents today for CT guided biopsy of the left lung nodule.  Past Medical History  Diagnosis Date  . Cancer     lung ca, RUL  . CHF (congestive heart failure)   . Hypertension   . COPD (chronic obstructive pulmonary disease)   . Hx pulmonary embolism 03/2011    on coumadin till 02/2011,stopped  due to gi bleed  . GERD (gastroesophageal reflux disease)   . GI bleed   . Colitis     history  . Shortness of breath   . Chest pain 06/30/11    hx blood clot in lung  . Bell's palsy   . PVD (peripheral vascular disease)   . Lung cancer     metachronous stage I nscca RUL  . History of radiation therapy 5/8/10th,14th,16th,and 10/27/2010    RUL lung nscca  . Blood transfusion     during lumbar surgery  . History of chemotherapy 2006-207    carboplatin/VP16 last January 207  . Clotting disorder     DVT/ PE    Past Surgical History  Procedure Date  . Right lower lobe lung resection 2007  . Cataract surgery     b/l  lens implants  . Abdominal hysterectomy     partial age 62  . Shoulder open rotator cuff repair 04/2003    left  . Appendectomy   . Carpal tunnel release   . Bladder surgery   . Lumbar spine surgery     x3, L4-S1  . Right lung biopsy 2006  . Coronary angioplasty with stent placement 2001    renal stent  . Colonoscopies 2011    polyp removal     Family History  Problem Relation Age of Onset  . Breast cancer Mother   . Uterine cancer Sister   . Lung cancer Sister   . Breast cancer Maternal Grandmother   . Colon cancer Sister   . Uterine cancer Sister    Social History:  reports that she quit smoking about 8 years ago. Her smoking use included Cigarettes. She has a 108 pack-year smoking history. She does not have any smokeless  tobacco history on file. She reports that she does not drink alcohol or use illicit drugs.  Allergies:  Allergies  Allergen Reactions  . Aspirin Other (See Comments)    Stomach pain    Current outpatient prescriptions:acetaminophen (TYLENOL) 650 MG CR tablet, Take 650 mg by mouth every 8 (eight) hours as needed. For pain, Disp: , Rfl: ;  amLODipine-benazepril (LOTREL) 5-10 MG per capsule, Take 1 capsule by mouth every morning. , Disp: , Rfl: ;  cimetidine (TAGAMET) 200 MG tablet, Take 200 mg by mouth daily.  , Disp: , Rfl:  diphenhydramine-acetaminophen (TYLENOL PM) 25-500 MG TABS, Take 2 tablets by mouth at bedtime as needed. For sleeplessness, Disp: , Rfl: ;  fenofibrate (TRICOR) 145 MG tablet, Take 134 mg by mouth every morning. , Disp: , Rfl: ;  furosemide (LASIX) 40 MG tablet, Take 40 mg by mouth every morning. , Disp: , Rfl: ;  Glucosamine-Chondroit-Vit C-Mn (GLUCOSAMINE 1500 COMPLEX PO), Take 750 mg by mouth daily., Disp: , Rfl:  methocarbamol (ROBAXIN) 500 MG tablet, Take 500 mg by mouth every 8 (eight) hours as needed. For muscle spasms, Disp: , Rfl: ;  omega-3 acid ethyl esters (LOVAZA) 1 G capsule, Take 2 g by mouth daily., Disp: , Rfl: ;  oxyCODONE-acetaminophen (PERCOCET) 5-325 MG per tablet, Take 1 tablet by mouth every 4 (four) hours as needed. For pain, Disp: , Rfl:  sennosides-docusate sodium (SENOKOT-S) 8.6-50 MG tablet, Take 1 tablet by mouth daily as needed. For constipation, Disp: , Rfl: ;  simvastatin (ZOCOR) 80 MG tablet, Take 80 mg by mouth at bedtime. , Disp: , Rfl: ;  Tamsulosin HCl (FLOMAX) 0.4 MG CAPS, Take 0.4 mg by mouth daily after breakfast., Disp: , Rfl: ;  tiotropium (SPIRIVA) 18 MCG inhalation capsule, Place 18 mcg into inhaler and inhale daily.  , Disp: , Rfl:  alendronate (FOSAMAX) 70 MG tablet, Take 70 mg by mouth every 7 (seven) days. Take with a full glass of water on an empty stomach. On Tuesdays, Disp: , Rfl: ;  warfarin (COUMADIN) 3 MG tablet, Take by mouth  daily. Take 1/2 pill Sat and Sun, take whole pill weekdays, Disp: , Rfl:  Current facility-administered medications:0.9 %  sodium chloride infusion, , Intravenous, Continuous, D Jeananne Rama, PA, Last Rate: 20 mL/hr at 04/01/12 0755   Results for orders placed during the hospital encounter of 04/01/12 (from the past 48 hour(s))  APTT     Status: Normal   Collection Time   04/01/12  7:55 AM      Component Value Range Comment   aPTT 29  24 - 37 seconds   CBC     Status: Normal   Collection Time   04/01/12  7:55 AM      Component Value Range Comment   WBC 7.9  4.0 - 10.5 K/uL    RBC 4.83  3.87 - 5.11 MIL/uL    Hemoglobin 14.8  12.0 - 15.0 g/dL    HCT 16.1  09.6 - 04.5 %    MCV 91.9  78.0 - 100.0 fL    MCH 30.6  26.0 - 34.0 pg    MCHC 33.3  30.0 - 36.0 g/dL    RDW 40.9  81.1 - 91.4 %    Platelets 203  150 - 400 K/uL   PROTIME-INR     Status: Abnormal   Collection Time   04/01/12  7:55 AM      Component Value Range Comment   Prothrombin Time 15.8 (*) 11.6 - 15.2 seconds    INR 1.29  0.00 - 1.49    No results found.  Review of Systems  Constitutional: Negative for fever and chills.  HENT:       Occ HA's  Respiratory: Positive for cough and shortness of breath.   Cardiovascular: Negative for chest pain.  Gastrointestinal: Negative for nausea, vomiting and abdominal pain.       Occ blood in stool  Musculoskeletal: Positive for back pain.    Blood pressure 142/71, pulse 71, temperature 97.7 F (36.5 C), temperature source Oral, resp. rate 20, height 5' 4.5" (1.638 m), weight 220 lb (99.791 kg), SpO2 96.00%. Physical Exam  Constitutional: She is oriented to person, place, and time. She appears well-developed and well-nourished.  Cardiovascular: Normal rate and regular rhythm.   Respiratory: Effort normal and breath sounds normal.  GI: Soft. Bowel sounds are normal. There is no tenderness.       obese  Musculoskeletal: Normal range of motion. She exhibits edema.    Neurological: She is alert and oriented to person, place, and time.     Assessment/Plan Patient with history of NSC/neuroendocrine carcinoma of  right lung and recent PET scan revealing a hypermetabolic 1cm nodule in lingula of left upper lobe of lung. Plan is for CT guided biopsy of the left lung nodule today. Details/risks of procedure d/w pt /daughter with their understanding and consent.  ALLRED,D KEVIN 04/01/2012, 8:26 AM

## 2012-04-01 NOTE — Progress Notes (Signed)
Patient ambulated in hallway after procedure without any problems. Patient denies SOB, dizziness and pain.

## 2012-04-01 NOTE — Progress Notes (Signed)
Dr Deanne Coffer in speaking w daughter

## 2012-04-01 NOTE — Procedures (Signed)
CT core bx lingular nodule 18g x4 Mild regional alveolar hemorrhage. No ptx.  No significant blood loss. See complete dictation in [facility]

## 2012-04-08 ENCOUNTER — Telehealth: Payer: Self-pay | Admitting: *Deleted

## 2012-04-08 NOTE — Telephone Encounter (Signed)
CALLED PATIENT TO INFORM OF FU VISIT FOR 04-11-12 AT 2:15 PM, SPOKE WITH PATIENT'S DAUGHTER AND SHE SAID THEY WOULD BE HERE.

## 2012-04-09 DIAGNOSIS — C3490 Malignant neoplasm of unspecified part of unspecified bronchus or lung: Secondary | ICD-10-CM | POA: Insufficient documentation

## 2012-04-10 ENCOUNTER — Encounter: Payer: Self-pay | Admitting: Radiation Oncology

## 2012-04-10 DIAGNOSIS — I739 Peripheral vascular disease, unspecified: Secondary | ICD-10-CM | POA: Insufficient documentation

## 2012-04-10 DIAGNOSIS — D689 Coagulation defect, unspecified: Secondary | ICD-10-CM | POA: Insufficient documentation

## 2012-04-10 DIAGNOSIS — K219 Gastro-esophageal reflux disease without esophagitis: Secondary | ICD-10-CM | POA: Insufficient documentation

## 2012-04-10 DIAGNOSIS — C801 Malignant (primary) neoplasm, unspecified: Secondary | ICD-10-CM | POA: Insufficient documentation

## 2012-04-10 DIAGNOSIS — G510 Bell's palsy: Secondary | ICD-10-CM | POA: Insufficient documentation

## 2012-04-10 DIAGNOSIS — I509 Heart failure, unspecified: Secondary | ICD-10-CM | POA: Insufficient documentation

## 2012-04-10 DIAGNOSIS — K529 Noninfective gastroenteritis and colitis, unspecified: Secondary | ICD-10-CM | POA: Insufficient documentation

## 2012-04-10 DIAGNOSIS — K922 Gastrointestinal hemorrhage, unspecified: Secondary | ICD-10-CM | POA: Insufficient documentation

## 2012-04-10 DIAGNOSIS — I10 Essential (primary) hypertension: Secondary | ICD-10-CM | POA: Insufficient documentation

## 2012-04-11 ENCOUNTER — Encounter: Payer: Self-pay | Admitting: Radiation Oncology

## 2012-04-11 ENCOUNTER — Ambulatory Visit
Admission: RE | Admit: 2012-04-11 | Discharge: 2012-04-11 | Disposition: A | Discharge status: Home / Self Care | Payer: Medicare Other | Source: Ambulatory Visit | Attending: Radiation Oncology | Admitting: Radiation Oncology

## 2012-04-11 VITALS — BP 122/61 | HR 86 | Temp 98.9°F | Resp 22 | Wt 219.8 lb

## 2012-04-11 DIAGNOSIS — C3490 Malignant neoplasm of unspecified part of unspecified bronchus or lung: Secondary | ICD-10-CM

## 2012-04-11 DIAGNOSIS — C3410 Malignant neoplasm of upper lobe, unspecified bronchus or lung: Secondary | ICD-10-CM

## 2012-04-11 NOTE — Progress Notes (Signed)
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                              ALLERGIES:  is allergic to aspirin.  Meds: Current Outpatient Prescriptions  Medication Sig Dispense Refill  . acetaminophen (TYLENOL) 650 MG CR tablet Take 650 mg by mouth every 8 (eight) hours as needed. For pain      . alendronate (FOSAMAX) 70 MG tablet Take 70 mg by mouth every 7 (seven) days. Take with a full glass of water on an empty stomach. On Tuesdays      . amLODipine-benazepril (LOTREL) 5-10 MG per capsule Take 1 capsule by mouth every morning.       . cimetidine (TAGAMET) 200 MG tablet Take 200 mg by mouth daily.        . diphenhydramine-acetaminophen (TYLENOL PM) 25-500 MG TABS Take 2 tablets by mouth at bedtime as needed. For sleeplessness      . fenofibrate (TRICOR) 145 MG tablet Take 134 mg by mouth every morning.       . furosemide (LASIX) 40 MG tablet Take 40 mg by mouth every morning.       . Glucosamine-Chondroit-Vit C-Mn (GLUCOSAMINE 1500 COMPLEX PO) Take 750 mg by mouth daily.      . methocarbamol (ROBAXIN) 500 MG tablet Take 500 mg by mouth every 8 (eight) hours as needed. For muscle spasms      . omega-3 acid ethyl esters (LOVAZA) 1 G capsule Take 2 g by mouth daily.      Marland Kitchen oxyCODONE-acetaminophen (PERCOCET) 5-325 MG per tablet Take 1 tablet by mouth every 4 (four) hours as needed. For pain      . sennosides-docusate sodium (SENOKOT-S) 8.6-50 MG tablet Take 1 tablet by mouth daily as needed. For constipation      . simvastatin (ZOCOR) 80 MG tablet Take 80 mg by mouth at bedtime.       . Tamsulosin HCl (FLOMAX) 0.4 MG CAPS Take 0.4 mg  by mouth daily after breakfast.      . tiotropium (SPIRIVA) 18 MCG inhalation capsule Place 18 mcg into inhaler and inhale daily.        Marland Kitchen warfarin (COUMADIN) 3 MG tablet Take by mouth daily. Take 1/2 pill Sat and Sun, take whole pill weekdays        Physical Findings: The patient is in no acute distress. Patient is alert and oriented.  vitals were not taken for this visit..  No significant changes.  Radiographic Findings: Dg Chest 1 View  04/01/2012  *RADIOLOGY REPORT*  Clinical Data: Post biopsy  CHEST - 1 VIEW  Comparison: CT chest 02/14/2012  Findings: Cardiomediastinal silhouette is stable.  Again noted nodule in the lingula surrounded by hallow of air space disease. No pulmonary edema.  No diagnostic pneumothorax.  Stable focal sclerosis in proximal left humerus.  IMPRESSION: Again noted nodule in the lingula surrounded by hallow of air space disease.  No pulmonary edema.  No diagnostic pneumothorax.  Stable focal sclerosis in proximal left humerus.  Original Report Authenticated By: Natasha Mead, M.D.    Nm Pet Image Restag (ps) Skull Base To Thigh  03/14/2012  *RADIOLOGY REPORT*  Clinical Data: Subsequent treatment strategy for lung cancer. New solitary pulmonary nodule.  NUCLEAR MEDICINE PET SKULL BASE TO THIGH  Fasting Blood Glucose:  135  Technique:  16.2 mCi F-18 FDG was injected intravenously. CT data was obtained and used for attenuation correction and anatomic localization only.  (This was not acquired as a diagnostic CT examination.) Additional exam technical data entered on technologist worksheet.  Comparison:  CT chest dated 02/14/2012.  PET CT dated 09/07/2010.  Findings:  Neck: No hypermetabolic lymph nodes in the neck.  Chest:  9 x 13 mm lingular nodule (series 2/image 83), max SUV 3.9, suspicious for primary bronchogenic neoplasm.  Status post right lower lobectomy.  Emphysematous changes.  No hypermetabolic mediastinal or hilar nodes.  Abdomen/Pelvis:  No abnormal hypermetabolic  activity within the liver, pancreas, adrenal glands, or spleen.  No hypermetabolic lymph nodes in the abdomen or pelvis.  Left adrenal adenoma.  Infrarenal abdominal aortic stent.  Skeleton:  No focal hypermetabolic activity to suggest skeletal metastasis.  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  Prior lower lumbar spine fixation.  IMPRESSION: 9 x 13 mm lingular nodule, max SUV 3.9, suspicious for primary bronchogenic neoplasm.  When correlating with prior studies, a metachronous primary is favored.  No evidence of metastatic disease.  Status post right lower lobectomy.   Original Report Authenticated By: Charline Bills, M.D.    Ct Biopsy  04/02/2012  *RADIOLOGY REPORT*  Clinical data:  Lingular nodule  CT-GUIDED LEFT LUNG LESION BIOPSY  Technique and findings:  Select axial scans through the thorax were obtained.  The lingular nodule was localized and an appropriate skin entry site was selected. Operator donned sterile gloves and mask.   Site was marked, prepped with Betadine, draped in usual sterile fashion, infiltrated locally with 1% lidocaine.  Intravenous Fentanyl and Versed were administered as conscious sedation during continuous cardiorespiratory monitoring by the radiology RN, with a total moderate sedation time of 20 minutes.  Under CT fluoroscopic guidance, a 17 gauge trocar needle was advanced to the margin of the lesion.  Once needle tip position was confirmed, coaxial 18-gauge core biopsy samples were obtained, submitted in formalin to  surgical pathology.  The guide needle was removed.  Postprocedure scans show a mild amount of regional alveolar hemorrhage.  No pneumothorax.  The patient had mild hemoptysis but remained hemodynamically stable.  The patient transferred to for observation with plans for follow-up chest radiography in 1 hour.  IMPRESSION: 1.  Technically successful lingular nodule core biopsy under CT guidance.   Original Report Authenticated By: Osa Craver, M.D.      Impression:  The patient has a clinical stage IA mixed histology bronchogenic carcinoma and has been deemed unresectable by Dr. Dorris Fetch.  Plan:  Today, I talked to the patient and family about the findings and work-up thus far.  We discussed the natural history of disease and general treatment, highlighting the role or radiotherapy in the management.  We discussed the available radiation techniques including conventional conformal radiation versus SBRT, and focused on the details of logistics and delivery.  We reviewed the anticipated acute and late sequelae associated with radiation in this setting.  The patient was encouraged to ask questions that I answered to the best of my ability. The patient would like to proceed with stereotactic body radiotherapy and will be scheduled for  CT simulation.  I spent 30 minutes minutes face to face with the patient and more than 50% of that time was spent in counseling and/or coordination of care.   _____________________________________  Artist Pais. Kathrynn Running, M.D.

## 2012-04-11 NOTE — Progress Notes (Signed)
  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  Identity was confirmed.  All relevant records and images related to the planned course of therapy were reviewed.  The patient freely provided informed written consent to proceed with treatment after reviewing the details related to the planned course of therapy. The consent form was witnessed and verified by the simulation staff.  Then, the patient was set-up in a stable reproducible  supine position for radiation therapy.  A BodyFix immobilization pillow was fabricated for reproducible positioning.  4D respiratoy motion management CT images were obtained.  Surface markings were placed.  The CT images were loaded into the planning software.  Then, using Cine, MIP, and standard views, the internal target volume (ITV) and planning target volumes (PTV) were delinieated, and avoidance structures were contoured.  Treatment planning then occurred.  The radiation prescription was entered and confirmed.  A total of two complex treatment devices were fabricated in the form of the BodyFix immobilization pillow and a neck accuform cushion.  I have requested : 3D Simulation  I have requested a DVH of the following structures: Heart, Lungs, Esophagus, Chest Wall, Brachial Plexus, Major Blood Vessels, and targets.  PLAN:  The patient will receive 54 Gy in 3 fractions.  ________________________________  Artist Pais Kathrynn Running, M.D.

## 2012-04-11 NOTE — Progress Notes (Signed)
Pt c/o low back pain which is chronic for her, worse w/walking. Denies other pain, loss of appetite, denies cough. Reports SOB w/minimal exertion, uses O2 prn, fatigued. Pt for review of biopsy, CT sim today.

## 2012-04-22 ENCOUNTER — Encounter: Payer: Self-pay | Admitting: Radiation Oncology

## 2012-04-22 ENCOUNTER — Ambulatory Visit
Admission: RE | Admit: 2012-04-22 | Discharge: 2012-04-22 | Disposition: A | Discharge status: Home / Self Care | Payer: Medicare Other | Source: Ambulatory Visit | Attending: Radiation Oncology | Admitting: Radiation Oncology

## 2012-04-22 ENCOUNTER — Inpatient Hospital Stay: Admission: RE | Admit: 2012-04-22 | Payer: Self-pay | Source: Ambulatory Visit

## 2012-04-22 ENCOUNTER — Ambulatory Visit: Admission: RE | Admit: 2012-04-22 | Payer: Medicare Other | Source: Ambulatory Visit | Admitting: Radiation Oncology

## 2012-04-23 ENCOUNTER — Ambulatory Visit: Payer: Medicare Other | Admitting: Radiation Oncology

## 2012-04-24 ENCOUNTER — Encounter: Payer: Self-pay | Admitting: Radiation Oncology

## 2012-04-24 ENCOUNTER — Ambulatory Visit: Payer: Medicare Other | Admitting: Radiation Oncology

## 2012-04-24 ENCOUNTER — Ambulatory Visit: Admission: RE | Admit: 2012-04-24 | Payer: Medicare Other | Source: Ambulatory Visit | Admitting: Radiation Oncology

## 2012-04-24 ENCOUNTER — Ambulatory Visit
Admission: RE | Admit: 2012-04-24 | Discharge: 2012-04-24 | Disposition: A | Discharge status: Home / Self Care | Payer: Medicare Other | Source: Ambulatory Visit | Attending: Radiation Oncology | Admitting: Radiation Oncology

## 2012-04-25 ENCOUNTER — Encounter: Payer: Self-pay | Admitting: Family

## 2012-04-25 ENCOUNTER — Other Ambulatory Visit (HOSPITAL_BASED_OUTPATIENT_CLINIC_OR_DEPARTMENT_OTHER): Payer: Medicare Other | Admitting: Lab

## 2012-04-25 ENCOUNTER — Ambulatory Visit (HOSPITAL_BASED_OUTPATIENT_CLINIC_OR_DEPARTMENT_OTHER): Payer: Medicare Other | Admitting: Family

## 2012-04-25 ENCOUNTER — Telehealth: Payer: Self-pay | Admitting: Oncology

## 2012-04-25 VITALS — BP 98/56 | HR 68 | Temp 97.8°F | Resp 20 | Ht 64.5 in | Wt 221.7 lb

## 2012-04-25 DIAGNOSIS — C3410 Malignant neoplasm of upper lobe, unspecified bronchus or lung: Secondary | ICD-10-CM

## 2012-04-25 DIAGNOSIS — J4489 Other specified chronic obstructive pulmonary disease: Secondary | ICD-10-CM

## 2012-04-25 DIAGNOSIS — C3490 Malignant neoplasm of unspecified part of unspecified bronchus or lung: Secondary | ICD-10-CM

## 2012-04-25 DIAGNOSIS — Z86718 Personal history of other venous thrombosis and embolism: Secondary | ICD-10-CM

## 2012-04-25 DIAGNOSIS — Z86711 Personal history of pulmonary embolism: Secondary | ICD-10-CM

## 2012-04-25 DIAGNOSIS — J449 Chronic obstructive pulmonary disease, unspecified: Secondary | ICD-10-CM

## 2012-04-25 DIAGNOSIS — Z85118 Personal history of other malignant neoplasm of bronchus and lung: Secondary | ICD-10-CM

## 2012-04-25 LAB — CBC WITH DIFFERENTIAL/PLATELET
Basophils Absolute: 0 10*3/uL (ref 0.0–0.1)
Eosinophils Absolute: 0.2 10*3/uL (ref 0.0–0.5)
HCT: 42 % (ref 34.8–46.6)
HGB: 14.2 g/dL (ref 11.6–15.9)
LYMPH%: 28.2 % (ref 14.0–49.7)
MCV: 93.1 fL (ref 79.5–101.0)
MONO%: 5 % (ref 0.0–14.0)
NEUT#: 5.1 10*3/uL (ref 1.5–6.5)
NEUT%: 63.4 % (ref 38.4–76.8)
Platelets: 178 10*3/uL (ref 145–400)

## 2012-04-25 LAB — COMPREHENSIVE METABOLIC PANEL (CC13)
Albumin: 3.4 g/dL — ABNORMAL LOW (ref 3.5–5.0)
Alkaline Phosphatase: 39 U/L — ABNORMAL LOW (ref 40–150)
BUN: 31 mg/dL — ABNORMAL HIGH (ref 7.0–26.0)
Creatinine: 1.2 mg/dL — ABNORMAL HIGH (ref 0.6–1.1)
Glucose: 235 mg/dl — ABNORMAL HIGH (ref 70–99)
Potassium: 3.5 mEq/L (ref 3.5–5.1)
Total Bilirubin: 0.39 mg/dL (ref 0.20–1.20)

## 2012-04-25 NOTE — Telephone Encounter (Signed)
gv and printed appt schedule to pt for NOV °

## 2012-04-25 NOTE — Patient Instructions (Signed)
Please call us if you have any questions or concerns. 

## 2012-04-25 NOTE — Progress Notes (Signed)
Patient ID: [name], female   DOB: 03/25/1931, 78 y.o.   MRN: [id number] CSN: [id number]  CC: Oneita Hurt, MD Jo Bloomer, MD  Amy [place], NP-C Foye Deer, MD   Problem List: MANIYAH MOLLER is a 78 y.o. Caucasian female with a problem list consisting of:  1. 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  2. Squamous cell carcinoma of the right lower lobe T1 resected at the time of surgery on 08/09/2005. All resected lymph nodes were negative.  3. Non small cell lung cancer of the right upper lobe detected in March 2012, +NABx 09/21/10, with positive PET scan treated with stereotactic  radiation 5000 cGy in 5 fractions from 10/17/2010 through 10/27/2010.  4. Lingular nodule which appears to be increasing in size. This lesion may have been present on a CT scan of the chest going back to 08/30/2010. Core needle biopsy on 04/01/2012.  Pathology report on 04/01/2012 shows invasive poorly differentiated carcinoma with predominant small cell carcinoma component (90%) and minor squamous cell component (10%).  The pathology report shows p63 as diffusely positive, Chromogranin as diffusely positive, Synaptophysin diffusely positive and Cytokeratin 5/6 positive in squamous cell carcinoma component negative and the rest of the tumor. 5. SBRT on 04/22/2012, 04/24/2012 and another treatment scheduled for 04/29/2012. 6. 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  7. Anticoagulation therapy with Coumadin.  8. History of diastolic congestive heart failure October 2012.  9. History of colitis and rectal bleeding most recently September 2012.  10. Degenerative disease of the spine status post  decompression at L4 - S1 in May 2006.  11.Hypertension 12.Peripheral vascular disease 13.Dyslipidemia 14.History of colonic polyps with high-grade dysplasia noted in a tubular adenoma.  15.Hearing impairment requiring bilateral hearing aids since 2010.  16.History of a left frontal lobe lesion, possibly a meningioma, seen on MRI of the head in November 2006 and May 2007.  17.Systolic ejection murmur.  18.Chronic obstructive pulmonary disease.   Pneumovax was given October 2012.  Last flu shot was October 2013.   Dr. Arline Asp and I saw [name] today for follow up of her recurrent cancers involving the lung.  [name] was last seen by [place] on 03/14/2012.  She is here with her daughter, [name]. The patient is in a wheelchair and she also uses a cane.  Her back pain is apparently causing additional problems and she reports a new pain in her right shoulder/back area.  She states that her current pain started when she was receiving the tattoos during the Radiotherapy CT Simulation. She states she occasional pain when taking a deep breath or coughing on her right side. [name] states that her oxygen usage has increased to twice weekly at 2 LPM x 12 hours per day.    [name] met with Dr. Dorris Fetch on 03/18/2012 who stated she was not a surgical candidate.  [name] had a core needle biopsy of the lingular node and lung on 04/01/2012. The pathology report dated 04/01/2002 states the patient has an invasive poorly differentiated carcinoma with predominant small cell carcinoma component (90%)  and minor squamous cell component (10%). The pathology report shows p63 as diffusely positive, Chromogranin as diffusely positive, Synaptophysin diffusely positive and Cytokeratin 5/6  positive in squamous cell carcinoma component negative and the rest of the tumor.  [name] has been seen by Dr. Kathrynn Running and went to  SBRT simulation on 04/11/2012.  She underwent radiation therapy on 04/12/2012,  04/24/2012 and states that her last treatment is scheduled for 04/29/2012.    [name] continues to take Percocet twice daily for her pain. She has complaints of constipation and occasional rectal bleeding when she strains to have a bowel movement.  Spoke to the patient and her daughter about avoiding constipation with Miralax, Senokot, prune juice, stool softeners and increased hydration.  The patient is currently taking Senokot inconsistently, consumes prune juice daily and drinks water occasionally. The patient denies any other symptomatology today including fever, chills, night sweats, N/V/D or any unusual bleeding.  [name] continues daily Coumadin therapy for history of recurrent DVT and pulmonary emboli.  Her PT/INR is being checked and managed by [facility] Hights NP-C.   Past Medical History: Past Medical History  Diagnosis Date  . Cancer     lung ca, RUL  . CHF (congestive heart failure)   . Hypertension   . COPD (chronic obstructive pulmonary disease)   . Hx pulmonary embolism 03/2011    on coumadin till 02/2011,stopped  due to gi bleed  . GERD (gastroesophageal reflux disease)   . GI bleed   . Colitis     history  . Shortness of breath   . Chest pain 06/30/11    hx blood clot in lung  . Bell's palsy   . PVD (peripheral vascular disease)   . Lung cancer     metachronous stage I nscca RUL  . History of radiation therapy 5/8/10th,14th,16th,and 10/27/2010    RUL lung nscca  . Blood transfusion     during lumbar surgery  . History of chemotherapy 2006-207    carboplatin/VP16 last January 207  . Clotting disorder     DVT/ PE  . Lung cancer 04/01/12    biopsy-lingular nodule-inv carcinoma, minor squamous cell ca    Surgical History: Past Surgical History  Procedure Date  . Right lower lobe lung resection 2007  . Cataract surgery     b/l  lens implants  . Abdominal hysterectomy     partial age 28  . Shoulder open rotator cuff repair 04/2003    left  . Appendectomy   .  Carpal tunnel release   . Bladder surgery   . Lumbar spine surgery     x3, L4-S1  . Right lung biopsy 2006  . Coronary angioplasty with stent placement 2001    renal stent  . Colonoscopies 2011    polyp removal     Current Medications: Current Outpatient Prescriptions  Medication Sig Dispense Refill  . acetaminophen (TYLENOL) 650 MG CR tablet Take 650 mg by mouth every 8 (eight) hours as needed. For pain      . alendronate (FOSAMAX) 70 MG tablet Take 70 mg by mouth every 7 (seven) days. Take with a full glass of water on an empty stomach. On Tuesdays      . amLODipine-benazepril (LOTREL) 5-10 MG per capsule Take 1 capsule by mouth every morning.       . cimetidine (TAGAMET) 200 MG tablet Take 200 mg by mouth daily.        . diphenhydramine-acetaminophen (TYLENOL PM) 25-500 MG TABS Take 2 tablets by mouth at bedtime as needed. For sleeplessness      . fenofibrate (TRICOR) 145 MG tablet Take 134 mg by  mouth every morning.       . furosemide (LASIX) 40 MG tablet Take 40 mg by mouth every morning.       . Glucosamine-Chondroit-Vit C-Mn (GLUCOSAMINE 1500 COMPLEX PO) Take 750 mg by mouth daily.      . methocarbamol (ROBAXIN) 500 MG tablet Take 500 mg by mouth every 8 (eight) hours as needed. For muscle spasms      . omega-3 acid ethyl esters (LOVAZA) 1 G capsule Take 2 g by mouth daily.      Marland Kitchen oxyCODONE-acetaminophen (PERCOCET) 5-325 MG per tablet Take 1 tablet by mouth every 4 (four) hours as needed. For pain      . sennosides-docusate sodium (SENOKOT-S) 8.6-50 MG tablet Take 1 tablet by mouth daily as needed. For constipation      . simvastatin (ZOCOR) 80 MG tablet Take 80 mg by mouth at bedtime.       . Tamsulosin HCl (FLOMAX) 0.4 MG CAPS Take 0.4 mg by mouth daily after breakfast.      . tiotropium (SPIRIVA) 18 MCG inhalation capsule Place 18 mcg into inhaler and inhale daily.        Marland Kitchen warfarin (COUMADIN) 3 MG tablet Take by mouth daily. Take 1/2 pill Sat and Sun, take whole pill weekdays         Allergies: Allergies  Allergen Reactions  . Aspirin Other (See Comments)    Stomach pain    Family History: Family History  Problem Relation Age of Onset  . Breast cancer Mother   . Uterine cancer Sister   . Lung cancer Sister   . Breast cancer Maternal Grandmother   . Colon cancer Sister   . Uterine cancer Sister     Social History: History  Substance Use Topics  . Smoking status: Former Smoker -- 2.0 packs/day for 54 years    Types: Cigarettes    Quit date: 12/01/2003  . Smokeless tobacco: Never Used  . Alcohol Use: No    Review of Systems: 10 Point review of systems was completed and is negative except as noted above.   Physical Exam:   Blood pressure 98/56, pulse 68, temperature 97.8 F (36.6 C), temperature source Oral, resp. rate 20, height 5' 4.5" (1.638 m), weight 221 lb 11.2 oz (100.562 kg).  General appearance: Alert, cooperative, well nourished, no apparent distress, wheel-chair dependent Head: Normocephalic, without obvious abnormality, atraumatic, HOH, wears hearing aids Eyes: Conjunctivae/corneas clear, PERRLA, EOMI Nose: Nares, septum and mucosa are normal, no drainage or sinus tenderness Neck: No adenopathy, supple, symmetrical, trachea midline, thyroid not enlarged, no tenderness, excessive habitus Resp: Clear to auscultation bilaterally, RLL absent breath sounds Cardio: Regular rate and rhythm, S1, S2 normal, 1/6 murmur, no click, rub or gallop GI: Soft, distended, non-tender, hypoactive bowel sounds, excessive habitus Extremities: Extremities normal, atraumatic, no cyanosis or edema Lymph nodes: Cervical, supraclavicular, and axillary nodes normal Neurologic: Grossly normal   Laboratory Data: Results for orders placed in visit on 04/25/12 (from the past 48 hour(s))  CBC WITH DIFFERENTIAL     Status: Normal   Collection Time   04/25/12  8:37 AM      Component Value Range Comment   WBC 8.1  3.9 - 10.3 10e3/uL    NEUT# 5.1  1.5 - 6.5  10e3/uL    HGB 14.2  11.6 - 15.9 g/dL    HCT 14.7  82.9 - 56.2 %    Platelets 178  145 - 400 10e3/uL    MCV 93.1  79.5 -  101.0 fL    MCH 31.5  25.1 - 34.0 pg    MCHC 33.9  31.5 - 36.0 g/dL    RBC 1.61  0.96 - 0.45 10e6/uL    RDW 14.0  11.2 - 14.5 %    lymph# 2.3  0.9 - 3.3 10e3/uL    MONO# 0.4  0.1 - 0.9 10e3/uL    Eosinophils Absolute 0.2  0.0 - 0.5 10e3/uL    Basophils Absolute 0.0  0.0 - 0.1 10e3/uL    NEUT% 63.4  38.4 - 76.8 %    LYMPH% 28.2  14.0 - 49.7 %    MONO% 5.0  0.0 - 14.0 %    EOS% 3.0  0.0 - 7.0 %    BASO% 0.4  0.0 - 2.0 %   COMPREHENSIVE METABOLIC PANEL (CC13)     Status: Abnormal   Collection Time   04/25/12  8:37 AM      Component Value Range Comment   Sodium 136  136 - 145 mEq/L    Potassium 3.5  3.5 - 5.1 mEq/L    Chloride 103  98 - 107 mEq/L    CO2 24  22 - 29 mEq/L    Glucose 235 (*) 70 - 99 mg/dl    BUN 40.9 (*) 7.0 - 81.1 mg/dL    Creatinine 1.2 (*) 0.6 - 1.1 mg/dL    Total Bilirubin 9.14  0.20 - 1.20 mg/dL    Alkaline Phosphatase 39 (*) 40 - 150 U/L    AST 15  5 - 34 U/L    ALT 11  0 - 55 U/L    Total Protein 6.5  6.4 - 8.3 g/dL    Albumin 3.4 (*) 3.5 - 5.0 g/dL    Calcium 9.2  8.4 - 78.2 mg/dL   LACTATE DEHYDROGENASE (CC13)     Status: Normal   Collection Time   04/25/12  8:37 AM      Component Value Range Comment   LDH 147  125 - 245 U/L 04/17/12 - NOTE new reference range.    Imaging Studies: 1. PET scan from 09/07/2010 showed hypermetabolic right upper lobe pulmonary nodule, most consistent with bronchogenic carcinoma. SUV  max was 7.4. There was hypermetabolic activity associated with the left adrenal gland nodule. This was favored to be a lipid poor adenoma or hyperplasia.   2. MRI of the head with and without IV contrast on 10/04/2010 showed no acute or metastatic intracranial abnormality. There was stable MRI appearance of the brain as compared with a prior study of 11/02/2005. There were acute on chronic sphenoid sinus inflammatory  changes.   3. CT scan of the chest without IV contrast on 01/30/2011 showed interval reduction in the size of the right lobe pulmonary nodule without evidence of local recurrence. No evidence for mediastinal metastatic disease. Left adrenal adenoma was noted. CT scan of abdomen and pelvis with IV contrast on 02/22/2011 showed no acute findings. Hepatic steatosis was present. The patient has had previous hysterectomy. Both adnexa were unremarkable. No masses or lymphadenopathy noted within the abdomen or pelvis.   4. CT angiogram of the chest on 06/30/2011 showed no evidence for pulmonary embolism. The patient with status post right lower lobectomy with no evidence of recurrent or metastatic disease in the chest.   5. CT scan of the chest without IV contrast on 08/02/2011 showed a lingular nodule, small in size measuring 4 mm, more prominent than  on the prior study of 01/30/2011 in retrospect. A malignant  lesion was favored. There was focal air space consolidation in the superior segment of the left lower lobe felt to be new compared with the study of 06/30/2011, most consistent with infectious or inflammatory etiology. There were postoperative changes consistent with a right lower lobectomy. There was extensive atherosclerotic calcification of the arterial vasculature and pulmonary arterial hypertension.   6. Bone scan carried out on 08/20/2011 showed no evidence for bone metastases. There was increased radio tracer uptake within the posterior aspect of the lumbar spine consistent with chronic post-surgical change.   7. CT scan of the chest without IV contrast on 10/31/2011 showed continued interval enlargement of a now 5 mm nodule in the lingula seen on image 27 of series 5 suspicious for a small malignant nodule. Continued follow up was suggested. It was felt that this lesion was below the resolution of a PET scan and too small to biopsy. The patient was noted to be status post right lower lobectomy.  There was also extensive atherosclerosis involving the thoracic aorta, great vessels in the mediastinum and the coronary arteries including calcified atherosclerotic plaque in the left main, left anterior descending, left circumflex, and right coronary arteries. There was also extensive calcification of the aortic  valve and mitral annulus. There was a small hiatal hernia. There were no pathologically enlarged mediastinal or hilar lymph nodes.   8. CT scan of chest without IV contrast on 02/14/2012 showed enlargement of the lingular nodule consistent with neoplasm. There was pulmonary artery enlargement suggesting pulmonary arterial hypertension. Hepatic steatosis and probable hepatomegaly were noted.   9. PET scan on 03/14/2012 showed a 9 x 13 mm lingular nodule with maximum SUV of 3.9, suspicious for primary bronchogenic neoplasm. A  metachronous primary was favored. There was no evidence for metastatic disease. The patient was status post right lower lobectomy.  10. Chest x-ray on 04/01/2012 showed  again noted nodule in the lingula surrounded by hallow of air space disease.  No pulmonary edema.  No diagnostic pneumothorax.  Stable focal sclerosis in proximal left humerus  11.CT guided left lung lesion biopsy on 04/02/2012 - Technically successful lingular nodule core biopsy under CT guidance.   Impression/Plan: [name] has ongoing back problems and an increasing oxygen demand.  From the CT scan of the chest on 02/14/2012, PET scan on 03/14/2012 and CT guided biopsy on 04/01/2012, it appears the patient has an invasive poorly differentiated carcinoma with predominant small cell carcinoma component and minor squamous cell component malignant tumor of the lingula.  [name] has advanced COPD and has a history of pulmonary emboli, most recently in October 2012. She is presently on Coumadin therapy.  Dr. Arline Asp will be contacting Dr. Kathrynn Running to discuss the care of this patient.   Dr. Arline Asp  discussed the diagnosis with the patient and her daughter.  He has asked [name] to return to our office on 05/07/2012 for an office visit with him.  We will check CBC and chemistries at that time.  [name] and her daughter are encouraged to contact us in the interim if they have any questions or concerns.   Larina Bras, NP-C 04/25/2012, 9:16 AM

## 2012-04-28 ENCOUNTER — Ambulatory Visit: Payer: Medicare Other | Admitting: Radiation Oncology

## 2012-04-28 ENCOUNTER — Ambulatory Visit: Admission: RE | Admit: 2012-04-28 | Payer: Medicare Other | Source: Ambulatory Visit | Admitting: Radiation Oncology

## 2012-04-29 ENCOUNTER — Encounter: Payer: Self-pay | Admitting: Radiation Oncology

## 2012-04-29 ENCOUNTER — Ambulatory Visit
Admission: RE | Admit: 2012-04-29 | Discharge: 2012-04-29 | Disposition: A | Discharge status: Home / Self Care | Payer: Medicare Other | Source: Ambulatory Visit | Attending: Radiation Oncology | Admitting: Radiation Oncology

## 2012-04-29 ENCOUNTER — Ambulatory Visit: Payer: Medicare Other | Admitting: Radiation Oncology

## 2012-04-29 VITALS — BP 113/57 | HR 74 | Temp 97.4°F | Resp 20

## 2012-04-29 DIAGNOSIS — C3490 Malignant neoplasm of unspecified part of unspecified bronchus or lung: Secondary | ICD-10-CM

## 2012-04-29 NOTE — Progress Notes (Signed)
Patient here for 3rd and final left lung rad tx, patient in w/c, sob with exertion, 95% room air sats, no c/o pain, eating well, hoh and  wears hearing aid, non prod cough 1 month f/u appt card given to patient, c/o pain in lower right back, has rods/screws there, pain has eased off a little took pain med at 8am 12:20 PM

## 2012-04-29 NOTE — Progress Notes (Signed)
Weekly Management Note:  Site: Left lung Current Dose:  5000  cGy Projected Dose: 5000  cGy  Narrative: The patient is seen today for routine under treatment assessment. CBCT/MVCT images/port films were reviewed. Registration was excellent today. The chart was reviewed.   No respiratory difficulties. She tells me she'll see Dr. Arline Asp next week for discussion of possible adjuvant chemotherapy.  Physical Examination:  Filed Vitals:   04/29/12 1220  BP: 113/57  Pulse: 74  Temp: 97.4 F (36.3 C)  Resp: 20  .  Weight:  . No change.  Impression: SBRT well tolerated.  Plan: Followup visit with Dr. Kathrynn Running in one month.

## 2012-04-29 NOTE — Progress Notes (Signed)
  Radiation Oncology         [phone] ________________________________  Name: NAYLA DIAS MRN: [id number]  Date: 04/29/2012  DOB: 07/04/1930  Stereotactic Body Radiotherapy Treatment Procedure Note  NARRATIVE:  SHEKINA CORDELL was brought to the stereotactic radiation treatment machine and placed supine on the CT couch. The patient was set up for stereotactic body radiotherapy on the body fix pillow.  3D TREATMENT PLANNING AND DOSIMETRY:  The patient's radiation plan was reviewed and approved prior to starting treatment.  It showed 3-dimensional radiation distributions overlaid onto the planning CT.  The [facility] for the target structures as well as the organs at risk were reviewed. The documentation of this is filed in the radiation oncology EMR.  SIMULATION VERIFICATION:  The patient underwent CT imaging on the treatment unit.  These were carefully aligned to document that the ablative radiation dose would cover the target volume and maximally spare the nearby organs at risk according to the planned distribution.  SPECIAL TREATMENT PROCEDURE: Timothy Lasso received high dose ablative stereotactic body radiotherapy to the planned left lung target volume without unforeseen complications. Treatment was delivered uneventfully. The high doses associated with stereotactic body radiotherapy and the significant potential risks require careful treatment set up and patient monitoring constituting a special treatment procedure. Her final 18 Gy was delivered for a cumulative dose of 54 Gy in 3 sessions  STEREOTACTIC TREATMENT MANAGEMENT:  Following delivery, the patient was evaluated clinically. The patient tolerated treatment without significant acute effects, and was discharged to home in stable condition.    PLAN: Continue treatment as planned.  ------------------------------------------------        Maryln Gottron, MD

## 2012-04-30 ENCOUNTER — Ambulatory Visit: Payer: Medicare Other | Admitting: Radiation Oncology

## 2012-04-30 ENCOUNTER — Ambulatory Visit: Admission: RE | Admit: 2012-04-30 | Payer: Medicare Other | Source: Ambulatory Visit | Admitting: Radiation Oncology

## 2012-05-01 ENCOUNTER — Ambulatory Visit: Payer: Medicare Other | Admitting: Radiation Oncology

## 2012-05-02 ENCOUNTER — Ambulatory Visit: Admission: RE | Admit: 2012-05-02 | Payer: Medicare Other | Source: Ambulatory Visit | Admitting: Radiation Oncology

## 2012-05-02 ENCOUNTER — Ambulatory Visit: Payer: Medicare Other | Admitting: Radiation Oncology

## 2012-05-05 ENCOUNTER — Ambulatory Visit: Payer: Medicare Other | Admitting: Radiation Oncology

## 2012-05-07 ENCOUNTER — Telehealth: Payer: Self-pay | Admitting: Oncology

## 2012-05-07 ENCOUNTER — Encounter: Payer: Self-pay | Admitting: Oncology

## 2012-05-07 ENCOUNTER — Telehealth: Payer: Self-pay | Admitting: Medical Oncology

## 2012-05-07 ENCOUNTER — Other Ambulatory Visit (HOSPITAL_BASED_OUTPATIENT_CLINIC_OR_DEPARTMENT_OTHER): Payer: Medicare Other | Admitting: Lab

## 2012-05-07 ENCOUNTER — Other Ambulatory Visit: Payer: Self-pay | Admitting: Medical Oncology

## 2012-05-07 ENCOUNTER — Ambulatory Visit (HOSPITAL_BASED_OUTPATIENT_CLINIC_OR_DEPARTMENT_OTHER): Payer: Medicare Other | Admitting: Oncology

## 2012-05-07 VITALS — BP 113/57 | HR 84 | Temp 98.4°F | Resp 18 | Ht 64.5 in | Wt 216.6 lb

## 2012-05-07 DIAGNOSIS — C3490 Malignant neoplasm of unspecified part of unspecified bronchus or lung: Secondary | ICD-10-CM

## 2012-05-07 DIAGNOSIS — C3410 Malignant neoplasm of upper lobe, unspecified bronchus or lung: Secondary | ICD-10-CM

## 2012-05-07 LAB — CBC WITH DIFFERENTIAL/PLATELET
BASO%: 0.5 % (ref 0.0–2.0)
Basophils Absolute: 0 10e3/uL (ref 0.0–0.1)
EOS%: 3.6 % (ref 0.0–7.0)
Eosinophils Absolute: 0.3 10e3/uL (ref 0.0–0.5)
HCT: 46.1 % (ref 34.8–46.6)
HGB: 15.3 g/dL (ref 11.6–15.9)
LYMPH%: 23 % (ref 14.0–49.7)
MCH: 31.1 pg (ref 25.1–34.0)
MCHC: 33.1 g/dL (ref 31.5–36.0)
MCV: 93.7 fL (ref 79.5–101.0)
MONO#: 0.6 10e3/uL (ref 0.1–0.9)
MONO%: 7.5 % (ref 0.0–14.0)
NEUT#: 5.2 10e3/uL (ref 1.5–6.5)
NEUT%: 65.4 % (ref 38.4–76.8)
Platelets: 169 10e3/uL (ref 145–400)
RBC: 4.92 10e6/uL (ref 3.70–5.45)
RDW: 13.9 % (ref 11.2–14.5)
WBC: 8 10e3/uL (ref 3.9–10.3)
lymph#: 1.8 10e3/uL (ref 0.9–3.3)

## 2012-05-07 LAB — COMPREHENSIVE METABOLIC PANEL (CC13)
ALT: 13 U/L (ref 0–55)
AST: 17 U/L (ref 5–34)
Albumin: 3.7 g/dL (ref 3.5–5.0)
Alkaline Phosphatase: 47 U/L (ref 40–150)
BUN: 23 mg/dL (ref 7.0–26.0)
CO2: 22 meq/L (ref 22–29)
Calcium: 9.8 mg/dL (ref 8.4–10.4)
Chloride: 104 meq/L (ref 98–107)
Creatinine: 1.2 mg/dL — ABNORMAL HIGH (ref 0.6–1.1)
Glucose: 177 mg/dL — ABNORMAL HIGH (ref 70–99)
Potassium: 3.7 meq/L (ref 3.5–5.1)
Sodium: 139 meq/L (ref 136–145)
Total Bilirubin: 0.26 mg/dL (ref 0.20–1.20)
Total Protein: 6.9 g/dL (ref 6.4–8.3)

## 2012-05-07 MED ORDER — ONDANSETRON HCL 8 MG PO TABS: 8.0000 mg | ORAL_TABLET | Freq: Three times a day (TID) | ORAL | Status: DC | PRN

## 2012-05-07 NOTE — Progress Notes (Signed)
CC:   [name], M.D. Jo Alken, NP Jo Deer, MD  PROBLEM LIST:  1. 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  2. Squamous cell carcinoma of the right lower lobe T1 resected at the  time of surgery on 08/09/2005. All resected lymph nodes were  negative.  3. Non small cell lung cancer of the right upper lobe detected in March  2012, +NABx 09/21/10, with positive PET scan treated with stereotactic  radiation 5000 cGy in 5 fractions from 10/17/2010 through 10/27/2010.   4. Lingular nodule which appears to be increasing in size. This  lesion may have been present on a CT scan of the chest going back  to 08/30/2010. A PET scan carried out on 03/14/2012 showed no evidence of metastatic disease. Core needle biopsy of the lingular nodule was carried out on 04/01/2012 and showed invasive poorly differentiated carcinoma with predominant small cell carcinoma component and minor squamous cell component.  Stains for p63, chromogranin and synaptophysin were diffusely positive.  Cytokeratin 5/6 was positive in the squamous cell carcinoma component, but negative in the rest of the tumor.  The small cell component comprised about 90% of the tumor.  Slides were reviewed with Dr. Hollice Espy.  The lesion was treated with stereotactic body radiotherapy  (SBRT), 18 Gy per session, for a total of 3 sessions, administered on 04/22/2012, 04/24/2012, and 04/29/2012, for a  total of 54 Gy.  The patient will be starting chemotherapy with carboplatin and VP-16 on 05/19/2012 for a planned 4 cycles.  5. 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  6. Anticoagulation therapy with Coumadin.  7.  History of diastolic congestive heart failure October 2012.  8. History of colitis and rectal bleeding most recently September  2012.  9. Degenerative disease of the spine status post decompression at L4-  S1 in May 2006.  10.Hypertension.  11.Peripheral vascular disease.  12.Dyslipidemia.  13.History of colonic polyps with high-grade dysplasia noted in a  tubular adenoma.  14.Hearing impairment requiring bilateral hearing aids since 2010.  15.History of a left frontal lobe lesion, possibly a meningioma, seen  on MRI of the head in November 2006 and May 2007.  16.Systolic ejection murmur.  17.Chronic obstructive pulmonary disease. 18. Multivitamins 1 daily. 19. Protein drinks daily.  IMMUNIZATIONS: 1. Pneumovax was given in October 2012. 2. Flu shot was administered in October 2013.  SMOKING HISTORY:  Will be extracted.   HISTORY:  I saw [name] today for followup of her recurrent cancers involving the lung, as outlined above.  [name] was last seen by [place] on 04/25/2012, and prior to that on 03/14/2012.  She has completed her stereotactic radiotherapy treatments to the lingular nodule.  The patient is here today with her daughter, [name].  There has been no change in the patient's condition.  She gets around with a cane. She does have some slight dyspnea on exertion.  She denies any new respiratory problems or pain.  She does have chronic back pain.  She is here today for further discussions about chemotherapy.  PHYSICAL EXAMINATION:  Vital Signs:  There is little change.  Weight today is 216.6 pounds, height 5 feet 4-1/2 inches, body surface area 2.11 meters squared.  Blood pressure 113/57.  Other vital signs are normal.  The patient's physical exam is unchanged from recent prior exams.  Lymph:  No adenopathy.  Lungs:  Clear.  O2 saturation on room air at rest was 95%.  Cardiac:  Regular rhythm.  I did not appreciate a systolic ejection murmur which I have  heard previously.  Abdomen:  The abdomen with the patient sitting is benign.  Extremities:  No peripheral edema or clubbing.  Neurologic:  Exam was nonfocal.  Lines:  No Port-A- Cath or central catheter is present.  LABORATORY DATA:  Laboratory data from today:  White count 8.0, ANC 5.2, hemoglobin 15.3, hematocrit 46.1, platelets 169,000.  Chemistries notable for a glucose of 177, creatinine 1.2, BUN 23, albumin 3.7, and LDH of 139.  IMAGING STUDIES:  1. PET scan from 09/07/2010 showed hypermetabolic right upper lobe  pulmonary nodule, most consistent with bronchogenic carcinoma. SUV  max was 7.4. There was hypermetabolic activity associated with the  left adrenal gland nodule. This was favored to be a lipid poor  adenoma or hyperplasia.  2. MRI of the head with and without IV contrast on 10/04/2010 showed  no acute or metastatic intracranial abnormality. There was stable  MRI appearance of the brain as compared with a prior study of  11/02/2005. There were acute on chronic sphenoid sinus  inflammatory changes.  3. CT scan of the chest without IV contrast on 01/30/2011 showed  interval reduction in the size of the right lobe pulmonary nodule  without evidence of local recurrence. No evidence for mediastinal  metastatic disease. Left adrenal adenoma was noted. CT scan of  abdomen and pelvis with IV contrast on 02/22/2011 showed no acute  findings. Hepatic steatosis was present. The patient has had  previous hysterectomy. Both adnexa were unremarkable. No masses  or lymphadenopathy noted within the abdomen or pelvis.  4. CT angiogram of the chest on 06/30/2011 showed no evidence for  pulmonary embolism. The patient with status post right lower  lobectomy with no evidence of recurrent or metastatic disease in  the chest.  5. CT scan of the chest without IV contrast on 08/02/2011 showed a  lingular nodule, small in size measuring 4 mm, more prominent than  on the prior study of  01/30/2011 in retrospect. A malignant lesion  was favored. There was focal air space consolidation in the  superior segment of the left lower lobe felt to be new compared  with the study of 06/30/2011, most consistent with infectious or  inflammatory etiology. There were postoperative changes consistent  with a right lower lobectomy. There was extensive atherosclerotic  calcification of the arterial vasculature and pulmonary arterial  hypertension.  6. Bone scan carried out on 08/20/2011 showed no evidence for bone  metastases. There was increased radio tracer uptake within the  posterior aspect of the lumbar spine consistent with chronic post-  surgical change.  7. CT scan of the chest without IV contrast on 10/31/2011 showed  continued interval enlargement of a now 5 mm nodule in the lingula  seen on image 27 of series 5 suspicious for a small malignant  nodule. Continued followup was suggested. It was felt that this  lesion was below the resolution of a PET scan and too small to  biopsy. The patient was noted to be status post right lower  lobectomy. There was also extensive atherosclerosis involving the  thoracic aorta, great vessels in the mediastinum and the coronary  arteries including calcified atherosclerotic plaque in the  left  main, left anterior descending, left circumflex, and right coronary  arteries. There was also extensive calcification of the aortic  valve and mitral annulus. There was a small hiatal hernia. There  were no pathologically enlarged mediastinal or hilar lymph nodes.  8. CT scan of chest without IV contrast on 02/14/2012 showed enlargement of  the lingular nodule consistent with neoplasm. There was pulmonary  artery enlargement suggesting pulmonary arterial hypertension. Hepatic  steatosis and probable hepatomegaly were noted.  9. PET scan on 03/14/2012 showed a 9 x 13 mm lingular nodule with maximum  SUV of 3.9, suspicious for primary bronchogenic  neoplasm. A  metachronous primary was favored. There was no evidence for metastatic  disease. The patient was status post right lower lobectomy. 10. CT-guided core needle biopsy of the left lung lesion was carried     out on 04/01/2012. 11. Chest x-ray, 1 view, from 04/01/2012 showed the nodule in the     lingula surrounded by a halo of airspace disease.  There was no     pulmonary edema or pneumothorax.  There was stable focal sclerosis     in the proximal left humerus.  IMPRESSION AND PLAN:  Clearly Ms. Husted has had a most unusual course, as outlined above, with diagnosis of lung cancer dating back 7 years ago to November 2006.  All of these lesions have been nodules.  Once again, she has a nodule in the lingula, that once again shows small cell carcinoma in 90% of the tumor.  There is no evidence of metastatic disease, as per PET scan from 03/14/2012.  I have discussed the situation in detail with the patient and her daughter.  I do not think there is any clear-cut right answer here in terms of whether the patient should have adjuvant chemotherapy.  On the other hand, I do not see any contraindication to the use of chemotherapy in this patient even though she recently turned 81.  Her general condition has been fairly good, and there are no underlying medical diseases that would preclude the use of chemotherapy.  The patient did tolerate prior carboplatin and VP-16 without any major difficulties.  The patient and her daughter are more than willing to have 4 cycles of chemotherapy.  We will be very cautious with the dosing.  The patient will receive Neulasta on day 4.  We will plan to get started on 05/19/2012.  We will check a CBC again and also obtain a baseline 2-view chest x-ray, modifying the doses.  We will start with carboplatin 350 mg and VP-16 80 mg/meter squared, equal to 170 mg daily for 3 days for the VP-16.  On 05/22/2012 the patient will receive Neulasta 6 mg  subcu. Thereafter we will check CBCs on 05/26/2012 and 06/09/2012, and plan to see the patient again 4 weeks from the time of chemotherapy on 06/16/2012, at which time she will have CBC, chemistries.  The treatment on 06/16/2012 will be at 4 weeks rather than the usual 3 weeks because of the holiday week that starts with 06/09/2012.  If the patient seems to be tolerating chemotherapy well, we will treat her at 3-week intervals.  We may consider increasing the doses if she appears to be tolerating the treatment well.  As stated, we are planning to give her a total of 4 cycles as adjuvant therapy for her small cell lung cancer.  A prescription for Zofran 8 mg to take every 8 hours as needed for nausea will be given  to the patient.  Today's session was fairly extended, lasting 30 to 40 minutes, as we discussed the rationale for treatment, the potential benefits, and the possible toxicities.    ______________________________ Samul Dada, M.D. DSM/MEDQ  D:  05/07/2012  T:  05/07/2012  Job:  [id number]

## 2012-05-07 NOTE — Progress Notes (Signed)
This office note has been dictated.  #[phone]

## 2012-05-07 NOTE — Telephone Encounter (Signed)
Emailed [name] to add chemo..the patient aware...advised pt that she could just show up at radiology to get cx...gv and printed appt schedule for pt for Dec and Jan

## 2012-05-07 NOTE — Progress Notes (Signed)
[employer] and spoke with [name] and Moya and her ondansetron 8 mg TID has been approved from today until 06/10/13. Her Monia Pouch ID is [id number] the phone number for Prior Authorizations is [phone]. I called the patient and [facility] [phone].

## 2012-05-07 NOTE — Telephone Encounter (Signed)
I called pt to let her know that we called her in a prescription for zofran to [facility]. She has had chemo in the past and we reviewed how to take.

## 2012-05-07 NOTE — Patient Instructions (Signed)
To arrange chemo education class prior to chemotherapy.  Labs, chest x-ray and day 1 chemo--05/19/12.  More chemo--12/10 and 12/11.  Neulasta--12/12.  Weekly CBC every Monday after chemo.  Next appointment, labs and start of cycle #2--06/16/12.

## 2012-05-10 NOTE — Progress Notes (Signed)
°  Radiation Oncology         [phone] ________________________________  Name: [name] MRN: [id number]  Date: 04/24/2012  DOB: Feb 22, 1931  Stereotactic Body Radiotherapy Treatment Procedure Note  NARRATIVE:  [name] was brought to the stereotactic radiation treatment machine and placed supine on the CT couch. The patient was set up for stereotactic body radiotherapy on the body fix pillow.  3D TREATMENT PLANNING AND DOSIMETRY:  The patient's radiation plan was reviewed and approved prior to starting treatment.  It showed 3-dimensional radiation distributions overlaid onto the planning CT.  The [facility] for the target structures as well as the organs at risk were reviewed. The documentation of this is filed in the radiation oncology EMR.  SIMULATION VERIFICATION:  The patient underwent CT imaging on the treatment unit.  These were carefully aligned to document that the ablative radiation dose would cover the target volume and maximally spare the nearby organs at risk according to the planned distribution.  SPECIAL TREATMENT PROCEDURE: Timothy Lasso received high dose ablative stereotactic body radiotherapy to the planned target volume without unforeseen complications. Treatment was delivered uneventfully. The high doses associated with stereotactic body radiotherapy and the significant potential risks require careful treatment set up and patient monitoring constituting a special treatment procedure   STEREOTACTIC TREATMENT MANAGEMENT:  Following delivery, the patient was evaluated clinically. The patient tolerated treatment without significant acute effects, and was discharged to home in stable condition.    PLAN: Continue treatment as planned.  ________________________________

## 2012-05-10 NOTE — Progress Notes (Signed)
  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  Image-guidance was carried out with cone-beam CT  Narrative: The patient tolerated radiation treatment relatively well.     Plan: The patient has completed radiation treatment. The patient will return to radiation oncology clinic for routine followup in one month. I advised them to call or return sooner if they have any questions or concerns related to their recovery or treatment. ________________________________  Artist Pais. Kathrynn Running, M.D.

## 2012-05-10 NOTE — Progress Notes (Signed)
°  Radiation Oncology         ([phone] ________________________________  Name: NEFERTITI MOHAMAD MRN: [id number]  Date: 04/22/2012  DOB: 08-Sep-1930  Stereotactic Body Radiotherapy Treatment Procedure Note  NARRATIVE:  HEIDY MCCUBBIN was brought to the stereotactic radiation treatment machine and placed supine on the CT couch for his first SBRT fraction. The patient was set up for stereotactic body radiotherapy on the body fix pillow.  3D TREATMENT PLANNING AND DOSIMETRY:  The patient's radiation plan was reviewed and approved prior to starting treatment.  It showed 3-dimensional radiation distributions overlaid onto the planning CT.  The [facility] for the target structures as well as the organs at risk were reviewed. The documentation of this is filed in the radiation oncology EMR.  SIMULATION VERIFICATION:  The patient underwent CT imaging on the treatment unit.  These were carefully aligned to document that the ablative radiation dose would cover the target volume and maximally spare the nearby organs at risk according to the planned distribution.  SPECIAL TREATMENT PROCEDURE: Timothy Lasso received high dose ablative stereotactic body radiotherapy to the planned target volume without unforeseen complications. Treatment was delivered uneventfully. The high doses associated with stereotactic body radiotherapy and the significant potential risks require careful treatment set up and patient monitoring constituting a special treatment procedure   STEREOTACTIC TREATMENT MANAGEMENT:  Following delivery, the patient was evaluated clinically. The patient tolerated treatment without significant acute effects, and was discharged to home in stable condition.    PLAN: Continue treatment as planned.  ________________________________

## 2012-05-13 ENCOUNTER — Encounter: Payer: Self-pay | Admitting: *Deleted

## 2012-05-13 ENCOUNTER — Other Ambulatory Visit: Payer: Medicare Other

## 2012-05-13 ENCOUNTER — Ambulatory Visit (HOSPITAL_COMMUNITY)
Admission: RE | Admit: 2012-05-13 | Discharge: 2012-05-13 | Disposition: A | Discharge status: Home / Self Care | Payer: Medicare Other | Source: Ambulatory Visit | Attending: Oncology | Admitting: Oncology

## 2012-05-13 DIAGNOSIS — C3490 Malignant neoplasm of unspecified part of unspecified bronchus or lung: Secondary | ICD-10-CM

## 2012-05-13 DIAGNOSIS — R0602 Shortness of breath: Secondary | ICD-10-CM | POA: Insufficient documentation

## 2012-05-19 ENCOUNTER — Other Ambulatory Visit (HOSPITAL_BASED_OUTPATIENT_CLINIC_OR_DEPARTMENT_OTHER): Payer: Medicare Other | Admitting: Lab

## 2012-05-19 ENCOUNTER — Ambulatory Visit (HOSPITAL_BASED_OUTPATIENT_CLINIC_OR_DEPARTMENT_OTHER): Payer: Medicare Other

## 2012-05-19 ENCOUNTER — Other Ambulatory Visit (HOSPITAL_COMMUNITY): Payer: Medicare Other

## 2012-05-19 VITALS — BP 109/59 | HR 80 | Temp 98.8°F

## 2012-05-19 DIAGNOSIS — Z5111 Encounter for antineoplastic chemotherapy: Secondary | ICD-10-CM

## 2012-05-19 DIAGNOSIS — C3490 Malignant neoplasm of unspecified part of unspecified bronchus or lung: Secondary | ICD-10-CM

## 2012-05-19 DIAGNOSIS — C3410 Malignant neoplasm of upper lobe, unspecified bronchus or lung: Secondary | ICD-10-CM

## 2012-05-19 LAB — CBC WITH DIFFERENTIAL/PLATELET
Basophils Absolute: 0 10*3/uL (ref 0.0–0.1)
EOS%: 4.1 % (ref 0.0–7.0)
Eosinophils Absolute: 0.3 10*3/uL (ref 0.0–0.5)
HCT: 44.3 % (ref 34.8–46.6)
HGB: 14.6 g/dL (ref 11.6–15.9)
LYMPH%: 21.5 % (ref 14.0–49.7)
MCH: 31.3 pg (ref 25.1–34.0)
MCV: 94.9 fL (ref 79.5–101.0)
MONO%: 7.3 % (ref 0.0–14.0)
NEUT#: 5.5 10*3/uL (ref 1.5–6.5)
NEUT%: 66.9 % (ref 38.4–76.8)
Platelets: 172 10*3/uL (ref 145–400)
RDW: 14 % (ref 11.2–14.5)

## 2012-05-19 MED ORDER — SODIUM CHLORIDE 0.9 % IV SOLN
80.0000 mg/m2 | Freq: Once | INTRAVENOUS | Status: AC
  Administered 2012-05-19: 170 mg via INTRAVENOUS
  Filled 2012-05-19: qty 8.5

## 2012-05-19 MED ORDER — DEXAMETHASONE SODIUM PHOSPHATE 4 MG/ML IJ SOLN
20.0000 mg | Freq: Once | INTRAMUSCULAR | Status: AC
  Administered 2012-05-19: 20 mg via INTRAVENOUS

## 2012-05-19 MED ORDER — ONDANSETRON 16 MG/50ML IVPB (CHCC)
16.0000 mg | Freq: Once | INTRAVENOUS | Status: AC
  Administered 2012-05-19: 16 mg via INTRAVENOUS

## 2012-05-19 MED ORDER — SODIUM CHLORIDE 0.9 % IV SOLN
Freq: Once | INTRAVENOUS | Status: AC
  Administered 2012-05-19: 09:00:00 via INTRAVENOUS

## 2012-05-19 MED ORDER — SODIUM CHLORIDE 0.9 % IV SOLN
350.0000 mg | Freq: Once | INTRAVENOUS | Status: AC
  Administered 2012-05-19: 350 mg via INTRAVENOUS
  Filled 2012-05-19: qty 35

## 2012-05-19 NOTE — Patient Instructions (Addendum)
Etoposide, VP-16 injection What is this medicine? ETOPOSIDE, VP-16 (e toe POE side) is a chemotherapy drug. It is used to treat testicular cancer, lung cancer, and other cancers. This medicine may be used for other purposes; ask your health care provider or pharmacist if you have questions. What should I tell my health care provider before I take this medicine? They need to know if you have any of these conditions: -infection -kidney disease -low blood counts, like low white cell, platelet, or red cell counts -an unusual or allergic reaction to etoposide, other chemotherapeutic agents, other medicines, foods, dyes, or preservatives -pregnant or trying to get pregnant -breast-feeding How should I use this medicine? This medicine is for infusion into a vein. It is administered in a hospital or clinic by a specially trained health care professional. Talk to your pediatrician regarding the use of this medicine in children. Special care may be needed. Overdosage: If you think you have taken too much of this medicine contact a poison control center or emergency room at once. NOTE: This medicine is only for you. Do not share this medicine with others. What if I miss a dose? It is important not to miss your dose. Call your doctor or health care professional if you are unable to keep an appointment. What may interact with this medicine? -cyclosporine -medicines to increase blood counts like filgrastim, pegfilgrastim, sargramostim -vaccines This list may not describe all possible interactions. Give your health care provider a list of all the medicines, herbs, non-prescription drugs, or dietary supplements you use. Also tell them if you smoke, drink alcohol, or use illegal drugs. Some items may interact with your medicine. What should I watch for while using this medicine? Visit your doctor for checks on your progress. This drug may make you feel generally unwell. This is not uncommon, as chemotherapy  can affect healthy cells as well as cancer cells. Report any side effects. Continue your course of treatment even though you feel ill unless your doctor tells you to stop. In some cases, you may be given additional medicines to help with side effects. Follow all directions for their use. Call your doctor or health care professional for advice if you get a fever, chills or sore throat, or other symptoms of a cold or flu. Do not treat yourself. This drug decreases your body's ability to fight infections. Try to avoid being around people who are sick. This medicine may increase your risk to bruise or bleed. Call your doctor or health care professional if you notice any unusual bleeding. Be careful brushing and flossing your teeth or using a toothpick because you may get an infection or bleed more easily. If you have any dental work done, tell your dentist you are receiving this medicine. Avoid taking products that contain aspirin, acetaminophen, ibuprofen, naproxen, or ketoprofen unless instructed by your doctor. These medicines may hide a fever. Do not become pregnant while taking this medicine. Women should inform their doctor if they wish to become pregnant or think they might be pregnant. There is a potential for serious side effects to an unborn child. Talk to your health care professional or pharmacist for more information. Do not breast-feed an infant while taking this medicine. What side effects may I notice from receiving this medicine? Side effects that you should report to your doctor or health care professional as soon as possible: -allergic reactions like skin rash, itching or hives, swelling of the face, lips, or tongue -low blood counts - this medicine  may decrease the number of white blood cells, red blood cells and platelets. You may be at increased risk for infections and bleeding. -signs of infection - fever or chills, cough, sore throat, pain or difficulty passing urine -signs of  decreased platelets or bleeding - bruising, pinpoint red spots on the skin, black, tarry stools, blood in the urine -signs of decreased red blood cells - unusually weak or tired, fainting spells, lightheadedness -breathing problems -changes in vision -mouth or throat sores or ulcers -pain, redness, swelling or irritation at the injection site -pain, tingling, numbness in the hands or feet -redness, blistering, peeling or loosening of the skin, including inside the mouth -seizures -vomiting Side effects that usually do not require medical attention (report to your doctor or health care professional if they continue or are bothersome): -diarrhea -hair loss -loss of appetite -nausea -stomach pain This list may not describe all possible side effects. Call your doctor for medical advice about side effects. You may report side effects to FDA at 1-800-FDA-1088. Where should I keep my medicine? This drug is given in a hospital or clinic and will not be stored at home. NOTE: This sheet is a summary. It may not cover all possible information. If you have questions about this medicine, talk to your doctor, pharmacist, or health care provider.  2012, [employer]. (09/29/2007 5:24:12 PM)  Carboplatin injection What is this medicine? CARBOPLATIN (KAR boe pla tin) is a chemotherapy drug. It targets fast dividing cells, like cancer cells, and causes these cells to die. This medicine is used to treat ovarian cancer and many other cancers. This medicine may be used for other purposes; ask your health care provider or pharmacist if you have questions. What should I tell my health care provider before I take this medicine? They need to know if you have any of these conditions: -blood disorders -hearing problems -kidney disease -recent or ongoing radiation therapy -an unusual or allergic reaction to carboplatin, cisplatin, other chemotherapy, other medicines, foods, dyes, or  preservatives -pregnant or trying to get pregnant -breast-feeding How should I use this medicine? This drug is usually given as an infusion into a vein. It is administered in a hospital or clinic by a specially trained health care professional. Talk to your pediatrician regarding the use of this medicine in children. Special care may be needed. Overdosage: If you think you have taken too much of this medicine contact a poison control center or emergency room at once. NOTE: This medicine is only for you. Do not share this medicine with others. What if I miss a dose? It is important not to miss a dose. Call your doctor or health care professional if you are unable to keep an appointment. What may interact with this medicine? -medicines for seizures -medicines to increase blood counts like filgrastim, pegfilgrastim, sargramostim -some antibiotics like amikacin, gentamicin, neomycin, streptomycin, tobramycin -vaccines Talk to your doctor or health care professional before taking any of these medicines: -acetaminophen -aspirin -ibuprofen -ketoprofen -naproxen This list may not describe all possible interactions. Give your health care provider a list of all the medicines, herbs, non-prescription drugs, or dietary supplements you use. Also tell them if you smoke, drink alcohol, or use illegal drugs. Some items may interact with your medicine. What should I watch for while using this medicine? Your condition will be monitored carefully while you are receiving this medicine. You will need important blood work done while you are taking this medicine. This drug may make you feel generally  unwell. This is not uncommon, as chemotherapy can affect healthy cells as well as cancer cells. Report any side effects. Continue your course of treatment even though you feel ill unless your doctor tells you to stop. In some cases, you may be given additional medicines to help with side effects. Follow all directions  for their use. Call your doctor or health care professional for advice if you get a fever, chills or sore throat, or other symptoms of a cold or flu. Do not treat yourself. This drug decreases your body's ability to fight infections. Try to avoid being around people who are sick. This medicine may increase your risk to bruise or bleed. Call your doctor or health care professional if you notice any unusual bleeding. Be careful brushing and flossing your teeth or using a toothpick because you may get an infection or bleed more easily. If you have any dental work done, tell your dentist you are receiving this medicine. Avoid taking products that contain aspirin, acetaminophen, ibuprofen, naproxen, or ketoprofen unless instructed by your doctor. These medicines may hide a fever. Do not become pregnant while taking this medicine. Women should inform their doctor if they wish to become pregnant or think they might be pregnant. There is a potential for serious side effects to an unborn child. Talk to your health care professional or pharmacist for more information. Do not breast-feed an infant while taking this medicine. What side effects may I notice from receiving this medicine? Side effects that you should report to your doctor or health care professional as soon as possible: -allergic reactions like skin rash, itching or hives, swelling of the face, lips, or tongue -signs of infection - fever or chills, cough, sore throat, pain or difficulty passing urine -signs of decreased platelets or bleeding - bruising, pinpoint red spots on the skin, black, tarry stools, nosebleeds -signs of decreased red blood cells - unusually weak or tired, fainting spells, lightheadedness -breathing problems -changes in hearing -changes in vision -chest pain -high blood pressure -low blood counts - This drug may decrease the number of white blood cells, red blood cells and platelets. You may be at increased risk for  infections and bleeding. -nausea and vomiting -pain, swelling, redness or irritation at the injection site -pain, tingling, numbness in the hands or feet -problems with balance, talking, walking -trouble passing urine or change in the amount of urine Side effects that usually do not require medical attention (report to your doctor or health care professional if they continue or are bothersome): -hair loss -loss of appetite -metallic taste in the mouth or changes in taste This list may not describe all possible side effects. Call your doctor for medical advice about side effects. You may report side effects to FDA at 1-800-FDA-1088. Where should I keep my medicine? This drug is given in a hospital or clinic and will not be stored at home. NOTE: This sheet is a summary. It may not cover all possible information. If you have questions about this medicine, talk to your doctor, pharmacist, or health care provider.  2012, [employer]/Gold Standard. (09/02/2007 2:38:05 PM)  [facility] Discharge Instructions for Patients Receiving Chemotherapy  Today you received the following chemotherapy agents Etoposide/Carboplatin  To help prevent nausea and vomiting after your treatment, we encourage you to take your nausea medication Zofran Begin taking it at 8pm and take it as often as prescribed for the next 36 hours.   If you develop nausea and vomiting that is not  controlled by your nausea medication, call the clinic. If it is after clinic hours your family physician or the after hours number for the clinic or go to the Emergency Department.   BELOW ARE SYMPTOMS THAT SHOULD BE REPORTED IMMEDIATELY:  *FEVER GREATER THAN 100.5 F  *CHILLS WITH OR WITHOUT FEVER  NAUSEA AND VOMITING THAT IS NOT CONTROLLED WITH YOUR NAUSEA MEDICATION  *UNUSUAL SHORTNESS OF BREATH  *UNUSUAL BRUISING OR BLEEDING  TENDERNESS IN MOUTH AND THROAT WITH OR WITHOUT PRESENCE OF ULCERS  *URINARY  PROBLEMS  *BOWEL PROBLEMS  UNUSUAL RASH Items with * indicate a potential emergency and should be followed up as soon as possible.  One of the nurses will contact you 24 hours after your treatment. Please let the nurse know about any problems that you may have experienced. Feel free to call the clinic you have any questions or concerns. The clinic phone number is [phone].   I have been informed and understand all the instructions given to me. I know to contact the clinic, my physician, or go to the Emergency Department if any problems should occur. I do not have any questions at this time, but understand that I may call the clinic during office hours   should I have any questions or need assistance in obtaining follow up care.    __________________________________________  _____________  __________ Signature of Patient or Authorized Representative            Date                   Time    __________________________________________ Nurse's Signature

## 2012-05-20 ENCOUNTER — Ambulatory Visit (HOSPITAL_BASED_OUTPATIENT_CLINIC_OR_DEPARTMENT_OTHER): Payer: Medicare Other

## 2012-05-20 VITALS — BP 154/77 | HR 102 | Temp 98.3°F | Resp 20

## 2012-05-20 DIAGNOSIS — C3410 Malignant neoplasm of upper lobe, unspecified bronchus or lung: Secondary | ICD-10-CM

## 2012-05-20 DIAGNOSIS — C3490 Malignant neoplasm of unspecified part of unspecified bronchus or lung: Secondary | ICD-10-CM

## 2012-05-20 DIAGNOSIS — Z5111 Encounter for antineoplastic chemotherapy: Secondary | ICD-10-CM

## 2012-05-20 MED ORDER — HEPARIN SOD (PORK) LOCK FLUSH 100 UNIT/ML IV SOLN
500.0000 [IU] | Freq: Once | INTRAVENOUS | Status: DC | PRN
  Filled 2012-05-20: qty 5

## 2012-05-20 MED ORDER — SODIUM CHLORIDE 0.9 % IJ SOLN
10.0000 mL | INTRAMUSCULAR | Status: DC | PRN
  Filled 2012-05-20: qty 10

## 2012-05-20 MED ORDER — PROCHLORPERAZINE MALEATE 10 MG PO TABS
10.0000 mg | ORAL_TABLET | Freq: Once | ORAL | Status: AC
  Administered 2012-05-20: 10 mg via ORAL

## 2012-05-20 MED ORDER — SODIUM CHLORIDE 0.9 % IV SOLN
Freq: Once | INTRAVENOUS | Status: AC
  Administered 2012-05-20: 09:00:00 via INTRAVENOUS

## 2012-05-20 MED ORDER — SODIUM CHLORIDE 0.9 % IV SOLN
80.0000 mg/m2 | Freq: Once | INTRAVENOUS | Status: AC
  Administered 2012-05-20: 170 mg via INTRAVENOUS
  Filled 2012-05-20: qty 8.5

## 2012-05-20 NOTE — Patient Instructions (Signed)
[facility] Discharge Instructions for Patients Receiving Chemotherapy  Today you received the following chemotherapy agents VP-16 To help prevent nausea and vomiting after your treatment, we encourage you to take your nausea medication as directed.   If you develop nausea and vomiting that is not controlled by your nausea medication, call the clinic. If it is after clinic hours your family physician or the after hours number for the clinic or go to the Emergency Department.   BELOW ARE SYMPTOMS THAT SHOULD BE REPORTED IMMEDIATELY:  *FEVER GREATER THAN 100.5 F  *CHILLS WITH OR WITHOUT FEVER  NAUSEA AND VOMITING THAT IS NOT CONTROLLED WITH YOUR NAUSEA MEDICATION  *UNUSUAL SHORTNESS OF BREATH  *UNUSUAL BRUISING OR BLEEDING  TENDERNESS IN MOUTH AND THROAT WITH OR WITHOUT PRESENCE OF ULCERS  *URINARY PROBLEMS  *BOWEL PROBLEMS  UNUSUAL RASH Items with * indicate a potential emergency and should be followed up as soon as possible.  One of the nurses will contact you 24 hours after your treatment. Please let the nurse know about any problems that you may have experienced. Feel free to call the clinic you have any questions or concerns. The clinic phone number is [phone].   I have been informed and understand all the instructions given to me. I know to contact the clinic, my physician, or go to the Emergency Department if any problems should occur. I do not have any questions at this time, but understand that I may call the clinic during office hours   should I have any questions or need assistance in obtaining follow up care.    __________________________________________  _____________  __________ Signature of Patient or Authorized Representative            Date                   Time    __________________________________________ Nurse's Signature

## 2012-05-21 ENCOUNTER — Ambulatory Visit (HOSPITAL_BASED_OUTPATIENT_CLINIC_OR_DEPARTMENT_OTHER): Payer: Medicare Other

## 2012-05-21 VITALS — BP 113/71 | HR 100 | Temp 98.3°F

## 2012-05-21 DIAGNOSIS — Z5111 Encounter for antineoplastic chemotherapy: Secondary | ICD-10-CM

## 2012-05-21 DIAGNOSIS — C7B8 Other secondary neuroendocrine tumors: Secondary | ICD-10-CM

## 2012-05-21 DIAGNOSIS — C7A1 Malignant poorly differentiated neuroendocrine tumors: Secondary | ICD-10-CM

## 2012-05-21 DIAGNOSIS — C3410 Malignant neoplasm of upper lobe, unspecified bronchus or lung: Secondary | ICD-10-CM

## 2012-05-21 DIAGNOSIS — C3490 Malignant neoplasm of unspecified part of unspecified bronchus or lung: Secondary | ICD-10-CM

## 2012-05-21 MED ORDER — SODIUM CHLORIDE 0.9 % IV SOLN: Freq: Once | INTRAVENOUS | Status: DC

## 2012-05-21 MED ORDER — PROCHLORPERAZINE MALEATE 10 MG PO TABS
10.0000 mg | ORAL_TABLET | Freq: Once | ORAL | Status: AC
  Administered 2012-05-21: 10 mg via ORAL

## 2012-05-21 MED ORDER — ETOPOSIDE CHEMO INJECTION 1 GM/50ML
80.0000 mg/m2 | Freq: Once | INTRAVENOUS | Status: AC
  Administered 2012-05-21: 170 mg via INTRAVENOUS
  Filled 2012-05-21: qty 8.5

## 2012-05-21 NOTE — Patient Instructions (Signed)
[facility] Discharge Instructions for Patients Receiving Chemotherapy  Today you received the following chemotherapy agents :  Etoposide/ VP-16  To help prevent nausea and vomiting after your treatment, we encourage you to take your nausea medication    If you develop nausea and vomiting that is not controlled by your nausea medication, call the clinic. If it is after clinic hours your family physician or the after hours number for the clinic or go to the Emergency Department.   BELOW ARE SYMPTOMS THAT SHOULD BE REPORTED IMMEDIATELY:  *FEVER GREATER THAN 100.5 F  *CHILLS WITH OR WITHOUT FEVER  NAUSEA AND VOMITING THAT IS NOT CONTROLLED WITH YOUR NAUSEA MEDICATION  *UNUSUAL SHORTNESS OF BREATH  *UNUSUAL BRUISING OR BLEEDING  TENDERNESS IN MOUTH AND THROAT WITH OR WITHOUT PRESENCE OF ULCERS  *URINARY PROBLEMS  *BOWEL PROBLEMS  UNUSUAL RASH Items with * indicate a potential emergency and should be followed up as soon as possible.  One of the nurses will contact you 24 hours after your treatment. Please let the nurse know about any problems that you may have experienced. Feel free to call the clinic you have any questions or concerns. The clinic phone number is [phone].   I have been informed and understand all the instructions given to me. I know to contact the clinic, my physician, or go to the Emergency Department if any problems should occur. I do not have any questions at this time, but understand that I may call the clinic during office hours   should I have any questions or need assistance in obtaining follow up care.    __________________________________________  _____________  __________ Signature of Patient or Authorized Representative            Date                   Time    __________________________________________ Nurse's Signature

## 2012-05-22 ENCOUNTER — Telehealth: Payer: Self-pay | Admitting: *Deleted

## 2012-05-22 ENCOUNTER — Ambulatory Visit (HOSPITAL_BASED_OUTPATIENT_CLINIC_OR_DEPARTMENT_OTHER): Payer: Medicare Other

## 2012-05-22 VITALS — BP 102/65 | HR 79 | Temp 97.9°F

## 2012-05-22 DIAGNOSIS — C3490 Malignant neoplasm of unspecified part of unspecified bronchus or lung: Secondary | ICD-10-CM

## 2012-05-22 DIAGNOSIS — C3410 Malignant neoplasm of upper lobe, unspecified bronchus or lung: Secondary | ICD-10-CM

## 2012-05-22 MED ORDER — PEGFILGRASTIM INJECTION 6 MG/0.6ML
6.0000 mg | Freq: Once | SUBCUTANEOUS | Status: AC
  Administered 2012-05-22: 6 mg via SUBCUTANEOUS
  Filled 2012-05-22: qty 0.6

## 2012-05-22 NOTE — Telephone Encounter (Signed)
[name] here for neulasta injection following 1st carbo/vp16 chemo treatment.  States that she is doing good.  No nausea, vomiting or diarrhea.  Is drinking fluid and eating well.  Knows to call if she has any problems, concerns or questions.

## 2012-05-26 ENCOUNTER — Other Ambulatory Visit (HOSPITAL_BASED_OUTPATIENT_CLINIC_OR_DEPARTMENT_OTHER): Payer: Medicare Other | Admitting: Lab

## 2012-05-26 DIAGNOSIS — C3490 Malignant neoplasm of unspecified part of unspecified bronchus or lung: Secondary | ICD-10-CM

## 2012-05-26 DIAGNOSIS — C3410 Malignant neoplasm of upper lobe, unspecified bronchus or lung: Secondary | ICD-10-CM

## 2012-05-26 LAB — CBC WITH DIFFERENTIAL/PLATELET
Basophils Absolute: 0 10*3/uL (ref 0.0–0.1)
EOS%: 3 % (ref 0.0–7.0)
HGB: 13 g/dL (ref 11.6–15.9)
LYMPH%: 26.3 % (ref 14.0–49.7)
MCH: 31 pg (ref 25.1–34.0)
MCV: 93.6 fL (ref 79.5–101.0)
MONO%: 12.6 % (ref 0.0–14.0)
Platelets: 101 10*3/uL — ABNORMAL LOW (ref 145–400)
RBC: 4.19 10*6/uL (ref 3.70–5.45)
RDW: 14 % (ref 11.2–14.5)

## 2012-05-27 ENCOUNTER — Encounter: Payer: Self-pay | Admitting: Radiation Oncology

## 2012-05-29 ENCOUNTER — Ambulatory Visit: Payer: Medicare Other | Admitting: Radiation Oncology

## 2012-06-02 ENCOUNTER — Other Ambulatory Visit (HOSPITAL_BASED_OUTPATIENT_CLINIC_OR_DEPARTMENT_OTHER): Payer: Medicare Other | Admitting: Lab

## 2012-06-02 DIAGNOSIS — C3410 Malignant neoplasm of upper lobe, unspecified bronchus or lung: Secondary | ICD-10-CM

## 2012-06-02 LAB — CBC WITH DIFFERENTIAL/PLATELET
BASO%: 0.5 % (ref 0.0–2.0)
EOS%: 0.6 % (ref 0.0–7.0)
LYMPH%: 14.5 % (ref 14.0–49.7)
MCH: 31.8 pg (ref 25.1–34.0)
MCHC: 33.7 g/dL (ref 31.5–36.0)
MONO#: 1.7 10*3/uL — ABNORMAL HIGH (ref 0.1–0.9)
MONO%: 8.2 % (ref 0.0–14.0)
NEUT%: 76.2 % (ref 38.4–76.8)
Platelets: 105 10*3/uL — ABNORMAL LOW (ref 145–400)
RBC: 4.24 10*6/uL (ref 3.70–5.45)
WBC: 20.3 10*3/uL — ABNORMAL HIGH (ref 3.9–10.3)

## 2012-06-09 ENCOUNTER — Other Ambulatory Visit (HOSPITAL_BASED_OUTPATIENT_CLINIC_OR_DEPARTMENT_OTHER): Payer: Medicare Other | Admitting: Lab

## 2012-06-09 DIAGNOSIS — C3490 Malignant neoplasm of unspecified part of unspecified bronchus or lung: Secondary | ICD-10-CM

## 2012-06-09 LAB — CBC WITH DIFFERENTIAL/PLATELET
BASO%: 0.6 % (ref 0.0–2.0)
EOS%: 0.4 % (ref 0.0–7.0)
MCH: 32.1 pg (ref 25.1–34.0)
MCHC: 34.1 g/dL (ref 31.5–36.0)
MONO#: 1.6 10*3/uL — ABNORMAL HIGH (ref 0.1–0.9)
RBC: 4.2 10*6/uL (ref 3.70–5.45)
RDW: 14.7 % — ABNORMAL HIGH (ref 11.2–14.5)
WBC: 15.8 10*3/uL — ABNORMAL HIGH (ref 3.9–10.3)
lymph#: 2.4 10*3/uL (ref 0.9–3.3)

## 2012-06-12 ENCOUNTER — Ambulatory Visit
Admission: RE | Admit: 2012-06-12 | Discharge: 2012-06-12 | Disposition: A | Discharge status: Home / Self Care | Payer: Medicare Other | Source: Ambulatory Visit | Attending: Radiation Oncology | Admitting: Radiation Oncology

## 2012-06-12 ENCOUNTER — Encounter: Payer: Self-pay | Admitting: Radiation Oncology

## 2012-06-12 VITALS — BP 108/53 | HR 81 | Temp 97.4°F | Resp 20 | Wt 217.0 lb

## 2012-06-12 DIAGNOSIS — C3490 Malignant neoplasm of unspecified part of unspecified bronchus or lung: Secondary | ICD-10-CM

## 2012-06-12 DIAGNOSIS — C3410 Malignant neoplasm of upper lobe, unspecified bronchus or lung: Secondary | ICD-10-CM

## 2012-06-12 NOTE — Progress Notes (Signed)
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  Pt c/o right-sided lower back pain, states from s/p surgery in this area. She takes Percocet prn w/50-75% relief, but she is out of this med. She states PCP working on paperwork so her insurance will pay. Pt takes Robaxin, Tylenol Arthritis w/little relief, uses heating pad. Pain worsens w/movement.  Pt c/o SOB even when lying in bed, uses O2 prn at home. Dry cough, fatigue, denies loss of appetite                       ALLERGIES:  is allergic to aspirin.  Meds: Current Outpatient Prescriptions  Medication Sig Dispense Refill  . acetaminophen (TYLENOL) 650 MG CR tablet Take 650 mg by mouth every 8 (eight) hours as needed. For pain      . alendronate (FOSAMAX) 70 MG tablet Take 70 mg by mouth every 7 (seven) days. Take with a full glass of water on an empty stomach. On Tuesdays      . amLODipine-benazepril (LOTREL) 5-10 MG per capsule Take 1 capsule by mouth every morning.       . cimetidine (TAGAMET) 200 MG tablet Take 200 mg by mouth daily.        . diphenhydramine-acetaminophen (TYLENOL PM) 25-500 MG TABS Take 2 tablets by mouth at bedtime as needed. For sleeplessness      . fenofibrate (TRICOR) 145 MG tablet Take 134 mg by mouth every morning.       . furosemide (LASIX) 40 MG tablet Take 40 mg by mouth every morning.       . Glucosamine-Chondroit-Vit C-Mn (GLUCOSAMINE 1500 COMPLEX PO) Take 750 mg by mouth daily.      .  methocarbamol (ROBAXIN) 500 MG tablet Take 500 mg by mouth every 8 (eight) hours as needed. For muscle spasms      . Multiple Vitamin (MULTIVITAMIN) tablet Take 1 tablet by mouth daily.      Marland Kitchen omega-3 acid ethyl esters (LOVAZA) 1 G capsule Take 2 g by mouth daily.      . ondansetron (ZOFRAN) 8 MG tablet Take 1 tablet (8 mg total) by mouth every 8 (eight) hours as needed.  30 tablet  3  . sennosides-docusate sodium (SENOKOT-S) 8.6-50 MG tablet Take 1 tablet by mouth daily as needed. For constipation      . simvastatin (ZOCOR) 80 MG tablet Take 80 mg by mouth at bedtime.       Marland Kitchen tiotropium (SPIRIVA) 18 MCG inhalation capsule Place 18 mcg into inhaler and inhale daily.        Marland Kitchen warfarin (COUMADIN) 3 MG tablet Take by mouth daily. Take 1/2 pill Sat and Sun, take whole pill weekdays      . oxyCODONE-acetaminophen (PERCOCET) 5-325 MG per tablet Take 1 tablet by mouth every 4 (four) hours as needed. For pain        Physical Findings: The patient is in no acute  distress. Patient is alert and oriented.  weight is 217 lb (98.431 kg). Her oral temperature is 97.4 F (36.3 C). Her blood pressure is 108/53 and her pulse is 81. Her respiration is 20 and oxygen saturation is 95%. .  No significant changes.  Lab Findings: Lab Results  Component Value Date   WBC 15.8* 06/09/2012   HGB 13.5 06/09/2012   HCT 39.5 06/09/2012   MCV 94.0 06/09/2012   PLT 230 06/09/2012    @LASTCHEM @  Radiographic Findings: Dg Chest 2 View  05/13/2012  *RADIOLOGY REPORT*  Clinical Data: Lung cancer with shortness of breath.  CHEST - 2 VIEW  Comparison: 04/01/2012 and 06/30/2011 as well as chest CT 02/14/2012.  Findings: Examination demonstrates mild volume loss of the right lung with postsurgical change compatible prior partial right lobectomy.  There is no focal consolidation or effusion.  There is evidence of patient's known nodule over the lingula. Cardiomediastinal silhouette and remainder of the exam is unchanged.   IMPRESSION: No acute cardiopulmonary disease.  Stable postsurgical change and volume loss of the right lung. Known lingular nodule.   Original Report Authenticated By: Elberta Fortis, M.D.    Impression:  The patient is recovering from the effects of radiation.  She is getting systemic chemo now.  Plan:  Follow-up in 3 months.  _____________________________________  Artist Pais. Kathrynn Running, M.D.

## 2012-06-12 NOTE — Progress Notes (Signed)
Pt c/o right-sided lower back pain, states from s/p surgery in this area. She takes Percocet prn w/50-75% relief, but she is out of this med. She states PCP working on paperwork so her insurance will pay. Pt takes Robaxin, Tylenol Arthritis w/little relief, uses heating pad. Pain worsens w/movement. Pt c/o SOB even when lying in bed, uses O2 prn at home. Dry cough, fatigue, denies loss of appetite.

## 2012-06-13 ENCOUNTER — Encounter: Payer: Self-pay | Admitting: Pharmacist

## 2012-06-16 ENCOUNTER — Ambulatory Visit: Payer: Medicare Other | Admitting: Oncology

## 2012-06-16 ENCOUNTER — Ambulatory Visit (HOSPITAL_BASED_OUTPATIENT_CLINIC_OR_DEPARTMENT_OTHER): Payer: Medicare Other

## 2012-06-16 ENCOUNTER — Telehealth: Payer: Self-pay | Admitting: *Deleted

## 2012-06-16 ENCOUNTER — Ambulatory Visit (HOSPITAL_BASED_OUTPATIENT_CLINIC_OR_DEPARTMENT_OTHER): Payer: Medicare Other | Admitting: Family

## 2012-06-16 ENCOUNTER — Encounter: Payer: Self-pay | Admitting: Family

## 2012-06-16 ENCOUNTER — Other Ambulatory Visit (HOSPITAL_BASED_OUTPATIENT_CLINIC_OR_DEPARTMENT_OTHER): Payer: Medicare Other | Admitting: Lab

## 2012-06-16 ENCOUNTER — Telehealth: Payer: Self-pay | Admitting: Oncology

## 2012-06-16 VITALS — BP 92/65 | HR 96 | Temp 97.7°F | Resp 20 | Ht 64.5 in | Wt 216.9 lb

## 2012-06-16 DIAGNOSIS — C3490 Malignant neoplasm of unspecified part of unspecified bronchus or lung: Secondary | ICD-10-CM

## 2012-06-16 DIAGNOSIS — C801 Malignant (primary) neoplasm, unspecified: Secondary | ICD-10-CM

## 2012-06-16 DIAGNOSIS — C7A1 Malignant poorly differentiated neuroendocrine tumors: Secondary | ICD-10-CM

## 2012-06-16 DIAGNOSIS — Z85118 Personal history of other malignant neoplasm of bronchus and lung: Secondary | ICD-10-CM

## 2012-06-16 DIAGNOSIS — C3410 Malignant neoplasm of upper lobe, unspecified bronchus or lung: Secondary | ICD-10-CM

## 2012-06-16 DIAGNOSIS — Z86718 Personal history of other venous thrombosis and embolism: Secondary | ICD-10-CM

## 2012-06-16 DIAGNOSIS — Z5111 Encounter for antineoplastic chemotherapy: Secondary | ICD-10-CM

## 2012-06-16 LAB — CBC WITH DIFFERENTIAL/PLATELET
BASO%: 0.4 % (ref 0.0–2.0)
Basophils Absolute: 0 10*3/uL (ref 0.0–0.1)
EOS%: 1.1 % (ref 0.0–7.0)
HCT: 41.2 % (ref 34.8–46.6)
HGB: 13.6 g/dL (ref 11.6–15.9)
MONO#: 0.9 10*3/uL (ref 0.1–0.9)
NEUT%: 67.8 % (ref 38.4–76.8)
RDW: 15.6 % — ABNORMAL HIGH (ref 11.2–14.5)
WBC: 9.2 10*3/uL (ref 3.9–10.3)
lymph#: 1.9 10*3/uL (ref 0.9–3.3)

## 2012-06-16 LAB — COMPREHENSIVE METABOLIC PANEL (CC13)
Albumin: 3.6 g/dL (ref 3.5–5.0)
CO2: 24 mEq/L (ref 22–29)
Glucose: 219 mg/dl — ABNORMAL HIGH (ref 70–99)
Potassium: 3.4 mEq/L — ABNORMAL LOW (ref 3.5–5.1)
Sodium: 139 mEq/L (ref 136–145)
Total Protein: 7.2 g/dL (ref 6.4–8.3)

## 2012-06-16 LAB — LACTATE DEHYDROGENASE (CC13): LDH: 158 U/L (ref 125–245)

## 2012-06-16 MED ORDER — ONDANSETRON 16 MG/50ML IVPB (CHCC)
16.0000 mg | Freq: Once | INTRAVENOUS | Status: AC
  Administered 2012-06-16: 16 mg via INTRAVENOUS

## 2012-06-16 MED ORDER — DEXAMETHASONE SODIUM PHOSPHATE 10 MG/ML IJ SOLN
20.0000 mg | Freq: Once | INTRAMUSCULAR | Status: AC
  Administered 2012-06-16: 20 mg via INTRAVENOUS

## 2012-06-16 MED ORDER — SODIUM CHLORIDE 0.9 % IV SOLN
Freq: Once | INTRAVENOUS | Status: AC
  Administered 2012-06-16: 20 mL via INTRAVENOUS

## 2012-06-16 MED ORDER — SODIUM CHLORIDE 0.9 % IV SOLN
90.0000 mg/m2 | Freq: Once | INTRAVENOUS | Status: AC
  Administered 2012-06-16: 190 mg via INTRAVENOUS
  Filled 2012-06-16: qty 9.5

## 2012-06-16 MED ORDER — SODIUM CHLORIDE 0.9 % IV SOLN
350.0000 mg | Freq: Once | INTRAVENOUS | Status: AC
  Administered 2012-06-16: 350 mg via INTRAVENOUS
  Filled 2012-06-16: qty 35

## 2012-06-16 NOTE — Telephone Encounter (Signed)
gv and printed appt schedule for pt for Jan./...emailed michelle to add chemo...pt aware

## 2012-06-16 NOTE — Telephone Encounter (Signed)
Per staff message and POF I have scheduled appts.  JMW  

## 2012-06-16 NOTE — Progress Notes (Signed)
CC:   Oneita Hurt, M.D. [name], NP Foye Deer, MD  PROBLEM LIST:  1. 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  2. Squamous cell carcinoma of the right lower lobe T1 resected at the  time of surgery on 08/09/2005. All resected lymph nodes were  negative.  3. Non small cell lung cancer of the right upper lobe detected in March  2012, +NABx 09/21/10, with positive PET scan treated with stereotactic  radiation 5000 cGy in 5 fractions from 10/17/2010 through 10/27/2010.   4. Lingular nodule which appears to be increasing in size. This  lesion may have been present on a CT scan of the chest going back  to 08/30/2010. A PET scan carried out on 03/14/2012 showed no evidence of metastatic disease. Core needle biopsy of the lingular nodule was carried out on 04/01/2012 and showed invasive poorly differentiated carcinoma with predominant small cell carcinoma component and minor squamous cell component. Stains for p63, chromogranin and synaptophysin were diffusely positive. Cytokeratin 5/6 was positive in the squamous cell carcinoma component, but negative in the rest of the tumor. The small cell component comprised about 90%  of the tumor. Slides were reviewed with Dr. Hollice Espy. The lesion was  treated with stereotactic body radiotherapy (SBRT), 18 Gy per session, for  a total of 3 sessions, administered on 04/22/2012, 04/24/2012, and 04/29/2012, for a total of 54 Gy. The patient started chemotherapy with carboplatin and VP-16 on 05/19/2012 for a planned 4 cycles.   5. 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  6. Anticoagulation therapy with Coumadin.  7. History of  diastolic congestive heart failure October 2012.  8. History of colitis and rectal bleeding most recently September  2012.  9. Degenerative disease of the spine status post decompression at L4-  S1 in May 2006.  10.Hypertension.  11.Peripheral vascular disease.  12.Dyslipidemia.  13.History of colonic polyps with high-grade dysplasia noted in a  tubular adenoma.  14.Hearing impairment requiring bilateral hearing aids since 2010.  15.History of a left frontal lobe lesion, possibly a meningioma, seen  on MRI of the head in November 2006 and May 2007.  16.Systolic ejection murmur.  17.Chronic obstructive pulmonary disease.     MEDICATIONS:  Were reviewed and recorded.  Current Outpatient Prescriptions  Medication Sig Dispense Refill  . alendronate (FOSAMAX) 70 MG tablet Take 70 mg by mouth every 7 (seven) days. Take with a full glass of water on an empty stomach. On Tuesdays      . amLODipine-benazepril (LOTREL) 5-10 MG per capsule Take 1 capsule by mouth every morning.       . cimetidine (TAGAMET) 200 MG tablet Take 200 mg by mouth daily.        . diphenhydramine-acetaminophen (TYLENOL PM) 25-500 MG TABS Take 2 tablets by mouth at bedtime as needed. For sleeplessness      . fenofibrate (TRICOR) 145 MG tablet Take 134 mg by mouth every morning.       . furosemide (LASIX) 40 MG tablet Take 40 mg by mouth every morning.       . Glucosamine-Chondroit-Vit C-Mn (GLUCOSAMINE 1500 COMPLEX PO) Take 750 mg by mouth daily.      . methocarbamol (ROBAXIN) 500 MG tablet  Take 500 mg by mouth every 8 (eight) hours as needed. For muscle spasms      . Multiple Vitamin (MULTIVITAMIN) tablet Take 1 tablet by mouth daily.      Marland Kitchen omega-3 acid ethyl esters (LOVAZA) 1 G capsule Take 2 g by mouth daily.      . ondansetron (ZOFRAN) 8 MG tablet Take 1 tablet (8 mg total) by mouth every 8 (eight) hours as needed.  30 tablet  3  . sennosides-docusate sodium (SENOKOT-S) 8.6-50 MG tablet Take 1 tablet by mouth  daily as needed. For constipation      . simvastatin (ZOCOR) 80 MG tablet Take 80 mg by mouth at bedtime.       Marland Kitchen tiotropium (SPIRIVA) 18 MCG inhalation capsule Place 18 mcg into inhaler and inhale daily.        Marland Kitchen warfarin (COUMADIN) 3 MG tablet Take by mouth daily. Take 1/2 pill Sat and Sun, take whole pill weekdays      . acetaminophen (TYLENOL) 650 MG CR tablet Take 650 mg by mouth every 8 (eight) hours as needed. For pain      . oxyCODONE-acetaminophen (PERCOCET) 5-325 MG per tablet Take 1 tablet by mouth every 4 (four) hours as needed. For pain       No current facility-administered medications for this visit.   Facility-Administered Medications Ordered in Other Visits  Medication Dose Route Frequency Provider Last Rate Last Dose  . CARBOplatin (PARAPLATIN) 350 mg in sodium chloride 0.9 % 100 mL chemo infusion  350 mg Intravenous Once Samul Dada, MD      . etoposide (VEPESID) 190 mg in sodium chloride 0.9 % 500 mL chemo infusion  90 mg/m2 (Treatment Plan Actual) Intravenous Once Samul Dada, MD 510 mL/hr at 06/16/12 1309 190 mg at 06/16/12 1309   IMMUNIZATIONS:  1. Pneumovax was given in October 2012.  2. Flu shot was administered in October 2013.  SMOKING HISTORY: The patient smoked up to a pack and a half of cigarettes a day for over 50 years.  She stopped smoking in June of 2005.   HISTORY:  Kinberly Perris was seen today for followup of her recurrent cancers involving the lung as outlined above.  We are now treating Ms. Gene for the lingular nodule which underwent stereotactic radiation treatments back in November following a confirmatory biopsy.  That biopsy was carried out on 04/01/2012 and showed primarily invasive poorly differentiated carcinoma with a minor squamous cell component. The patient started her chemotherapy treatments on 05/19/2012.  She tolerated her treatment well.  She did have some nausea without vomiting.  She had no other side effects from the  treatment.  Her main problem appears to be low back pain.  There has been some problem getting her Percocet refilled with regard to the insurance company.  She has not had any Percocet in 2 weeks.  She is having more low back pain.  She uses a heating pad.  She is getting by with Extra Strength Tylenol at least for the moment.  The patient had an episode of arrhythmia, possibly atrial fibrillation, which has been intermittent.  She has seen Dr. Coralee Pesa a couple weeks ago.  She is now wearing a heart monitor.  Recordings will be for about a month.  The patient apparently did have some either dizziness or lightheadedness associated with the arrhythmia.  Aside from this she seems to be doing well with no significant changes in her condition.  PHYSICAL EXAMINATION:  The patient is in a wheelchair.  Weight is stable at 216.8 pounds.  Height 5 feet 4-1/2 inches.  Body surface area 2.12 sq m.  Blood pressure today 92/65.  Other vital signs are normal.  Pulse was basically regular with occasional ectopic beats, possibly a brief run of ectopic beats rather.  There is no scleral icterus.  Mouth and pharynx were benign.  No peripheral adenopathy palpable.  Heart and lungs were normal.  Rhythm was regular with some ectopic beats.  I did not appreciate a systolic ejection murmur.  I have heard this previously.  Abdomen with the patient sitting is benign.  Extremities: No peripheral edema or clubbing.  The patient is attached to a heart monitoring device.  No Port-A-Cath or central catheter.  Neurologic exam was nonfocal.  LABORATORY DATA:  Today, white count 9.2, ANC 6.2, hemoglobin 13.6, hematocrit 41.2, platelets 295,000.  The patient did not have any leukopenia or neutropenia.  She did have mild thrombocytopenia with a platelet count of 101,000 on 05/26/2012 and 105,000 on 06/02/2012.  IMAGING STUDIES:  1. PET scan from 09/07/2010 showed hypermetabolic right upper lobe  pulmonary nodule,  most consistent with bronchogenic carcinoma. SUV  max was 7.4. There was hypermetabolic activity associated with the  left adrenal gland nodule. This was favored to be a lipid poor  adenoma or hyperplasia.  2. MRI of the head with and without IV contrast on 10/04/2010 showed  no acute or metastatic intracranial abnormality. There was stable  MRI appearance of the brain as compared with a prior study of  11/02/2005. There were acute on chronic sphenoid sinus  inflammatory changes.  3. CT scan of the chest without IV contrast on 01/30/2011 showed  interval reduction in the size of the right lobe pulmonary nodule  without evidence of local recurrence. No evidence for mediastinal  metastatic disease. Left adrenal adenoma was noted. CT scan of  abdomen and pelvis with IV contrast on 02/22/2011 showed no acute  findings. Hepatic steatosis was present. The patient has had  previous hysterectomy. Both adnexa were unremarkable. No masses  or lymphadenopathy noted within the abdomen or pelvis.  4. CT angiogram of the chest on 06/30/2011 showed no evidence for  pulmonary embolism. The patient with status post right lower  lobectomy with no evidence of recurrent or metastatic disease in  the chest.  5. CT scan of the chest without IV contrast on 08/02/2011 showed a  lingular nodule, small in size measuring 4 mm, more prominent than  on the prior study of 01/30/2011 in retrospect. A malignant lesion  was favored. There was focal air space consolidation in the  superior segment of the left lower lobe felt to be new compared  with the study of 06/30/2011, most consistent with infectious or  inflammatory etiology. There were postoperative changes consistent  with a right lower lobectomy. There was extensive atherosclerotic  calcification of the arterial vasculature and pulmonary arterial  hypertension.  6. Bone scan carried out on 08/20/2011 showed no evidence for bone  metastases. There was  increased radio tracer uptake within the  posterior aspect of the lumbar spine consistent with chronic post-  surgical change.  7. CT scan of the chest without IV contrast on 10/31/2011 showed  continued interval enlargement of a now 5 mm nodule in the lingula  seen on image 27 of series 5 suspicious for a small malignant  nodule. Continued followup was suggested. It was felt that this  lesion was below the resolution  of a PET scan and too small to  biopsy. The patient was noted to be status post right lower  lobectomy. There was also extensive atherosclerosis involving the  thoracic aorta, great vessels in the mediastinum and the coronary  arteries including calcified atherosclerotic plaque in the left  main, left anterior descending, left circumflex, and right coronary  arteries. There was also extensive calcification of the aortic  valve and mitral annulus. There was a small hiatal hernia. There  were no pathologically enlarged mediastinal or hilar lymph nodes.  8. CT scan of chest without IV contrast on 02/14/2012 showed enlargement of  the lingular nodule consistent with neoplasm. There was pulmonary  artery enlargement suggesting pulmonary arterial hypertension. Hepatic  steatosis and probable hepatomegaly were noted.  9. PET scan on 03/14/2012 showed a 9 x 13 mm lingular nodule with maximum  SUV of 3.9, suspicious for primary bronchogenic neoplasm. A  metachronous primary was favored. There was no evidence for metastatic  disease. The patient was status post right lower lobectomy.  10. CT-guided core needle biopsy of the left lung lesion was carried  out on 04/01/2012.  11. Chest x-ray, 1 view, from 04/01/2012 showed the nodule in the  lingula surrounded by a halo of airspace disease. There was no  pulmonary edema or pneumothorax. There was stable focal sclerosis  in the proximal left humerus. 12. Chest x-ray, 2 view, from 05/13/2012 showed no acute cardiopulmonary disease.   There were stable post surgical changes and volume loss of the right lung as well as the known nodule over the lingula.   IMPRESSION AND PLAN:  Ms. Geisel did well following her first cycle of chemotherapy which was initiated on 05/19/2012.  We will go ahead and increase the VP-16 dose from 80 mg to 90 mg/sq m which will increase the actual dose from 170 mg to 190 mg.  VP-16 will be given over 3 days consecutively starting today.  Carboplatin will remain the same at 350 mg day 1.  The patient will come back on Thursday January 9 for Neulasta 6 mg subcu.  We will check a nadir CBC on January 16.  We will plan to see Ms. Kimber again in 3 weeks which will be around January 27 at which time she will have CBC and chemistries.  She can see Norina Buzzard at that time.  She will be due for the start of her 3rd cycle of chemotherapy.  We are planning to give 4 cycles of chemotherapy.  The patient was accompanied today by her daughter, Mariana Kaufman.    ______________________________ Samul Dada, M.D. DSM/MEDQ  D:  06/16/2012  T:  06/16/2012  Job:  [id number]

## 2012-06-16 NOTE — Progress Notes (Signed)
This office note has been dictated.  #[id number]

## 2012-06-17 ENCOUNTER — Other Ambulatory Visit: Payer: Self-pay | Admitting: Medical Oncology

## 2012-06-17 ENCOUNTER — Telehealth: Payer: Self-pay | Admitting: Medical Oncology

## 2012-06-17 ENCOUNTER — Ambulatory Visit (HOSPITAL_BASED_OUTPATIENT_CLINIC_OR_DEPARTMENT_OTHER): Payer: Medicare Other

## 2012-06-17 ENCOUNTER — Encounter: Payer: Self-pay | Admitting: Medical Oncology

## 2012-06-17 VITALS — BP 100/59 | HR 82 | Temp 98.5°F

## 2012-06-17 DIAGNOSIS — C3490 Malignant neoplasm of unspecified part of unspecified bronchus or lung: Secondary | ICD-10-CM

## 2012-06-17 DIAGNOSIS — C3410 Malignant neoplasm of upper lobe, unspecified bronchus or lung: Secondary | ICD-10-CM

## 2012-06-17 DIAGNOSIS — Z5111 Encounter for antineoplastic chemotherapy: Secondary | ICD-10-CM

## 2012-06-17 MED ORDER — PROCHLORPERAZINE MALEATE 10 MG PO TABS
10.0000 mg | ORAL_TABLET | Freq: Once | ORAL | Status: AC
  Administered 2012-06-17: 10 mg via ORAL

## 2012-06-17 MED ORDER — POTASSIUM CHLORIDE CRYS ER 20 MEQ PO TBCR: 20.0000 meq | EXTENDED_RELEASE_TABLET | ORAL | Status: DC

## 2012-06-17 MED ORDER — SODIUM CHLORIDE 0.9 % IV SOLN
Freq: Once | INTRAVENOUS | Status: AC
  Administered 2012-06-17: 11:00:00 via INTRAVENOUS

## 2012-06-17 MED ORDER — SODIUM CHLORIDE 0.9 % IV SOLN
90.0000 mg/m2 | Freq: Once | INTRAVENOUS | Status: AC
  Administered 2012-06-17: 190 mg via INTRAVENOUS
  Filled 2012-06-17: qty 9.5

## 2012-06-17 NOTE — Patient Instructions (Addendum)
[facility] Discharge Instructions for Patients Receiving Chemotherapy  Today you received the following chemotherapy agents Etoposide (VP 16) To help prevent nausea and vomiting after your treatment, we encourage you to take your nausea medication as prescribed. If you develop nausea and vomiting that is not controlled by your nausea medication, call the clinic. If it is after clinic hours your family physician or the after hours number for the clinic or go to the Emergency Department.   BELOW ARE SYMPTOMS THAT SHOULD BE REPORTED IMMEDIATELY:  *FEVER GREATER THAN 100.5 F  *CHILLS WITH OR WITHOUT FEVER  NAUSEA AND VOMITING THAT IS NOT CONTROLLED WITH YOUR NAUSEA MEDICATION  *UNUSUAL SHORTNESS OF BREATH  *UNUSUAL BRUISING OR BLEEDING  TENDERNESS IN MOUTH AND THROAT WITH OR WITHOUT PRESENCE OF ULCERS  *URINARY PROBLEMS  *BOWEL PROBLEMS  UNUSUAL RASH Items with * indicate a potential emergency and should be followed up as soon as possible.  One of the nurses will contact you 24 hours after your treatment. Please let the nurse know about any problems that you may have experienced. Feel free to call the clinic you have any questions or concerns. The clinic phone number is [phone].   I have been informed and understand all the instructions given to me. I know to contact the clinic, my physician, or go to the Emergency Department if any problems should occur. I do not have any questions at this time, but understand that I may call the clinic during office hours   should I have any questions or need assistance in obtaining follow up care.    __________________________________________  _____________  __________ Signature of Patient or Authorized Representative            Date                   Time    __________________________________________ Nurse's Signature    

## 2012-06-17 NOTE — Telephone Encounter (Signed)
I called pt per Dr. Arline Asp to inform her that her potassium is 3.4. He is going to prescribe K-Dur every other day. She voiced understanding.

## 2012-06-18 ENCOUNTER — Ambulatory Visit (HOSPITAL_BASED_OUTPATIENT_CLINIC_OR_DEPARTMENT_OTHER): Payer: Medicare Other

## 2012-06-18 VITALS — BP 127/72 | HR 74 | Temp 98.3°F

## 2012-06-18 DIAGNOSIS — C3490 Malignant neoplasm of unspecified part of unspecified bronchus or lung: Secondary | ICD-10-CM

## 2012-06-18 DIAGNOSIS — Z5111 Encounter for antineoplastic chemotherapy: Secondary | ICD-10-CM

## 2012-06-18 DIAGNOSIS — C3410 Malignant neoplasm of upper lobe, unspecified bronchus or lung: Secondary | ICD-10-CM

## 2012-06-18 MED ORDER — SODIUM CHLORIDE 0.9 % IV SOLN
90.0000 mg/m2 | Freq: Once | INTRAVENOUS | Status: AC
  Administered 2012-06-18: 190 mg via INTRAVENOUS
  Filled 2012-06-18: qty 9.5

## 2012-06-18 MED ORDER — PROCHLORPERAZINE MALEATE 10 MG PO TABS
10.0000 mg | ORAL_TABLET | Freq: Once | ORAL | Status: AC
  Administered 2012-06-18: 10 mg via ORAL

## 2012-06-18 MED ORDER — SODIUM CHLORIDE 0.9 % IV SOLN
Freq: Once | INTRAVENOUS | Status: AC
  Administered 2012-06-18: 10:00:00 via INTRAVENOUS

## 2012-06-18 NOTE — Patient Instructions (Addendum)
[facility] Discharge Instructions for Patients Receiving Chemotherapy  Today you received the following chemotherapy agents Etoposide  To help prevent nausea and vomiting after your treatment, we encourage you to take your nausea medication as prescribed.   If you develop nausea and vomiting that is not controlled by your nausea medication, call the clinic. If it is after clinic hours your family physician or the after hours number for the clinic or go to the Emergency Department.   BELOW ARE SYMPTOMS THAT SHOULD BE REPORTED IMMEDIATELY:  *FEVER GREATER THAN 100.5 F  *CHILLS WITH OR WITHOUT FEVER  NAUSEA AND VOMITING THAT IS NOT CONTROLLED WITH YOUR NAUSEA MEDICATION  *UNUSUAL SHORTNESS OF BREATH  *UNUSUAL BRUISING OR BLEEDING  TENDERNESS IN MOUTH AND THROAT WITH OR WITHOUT PRESENCE OF ULCERS  *URINARY PROBLEMS  *BOWEL PROBLEMS  UNUSUAL RASH Items with * indicate a potential emergency and should be followed up as soon as possible.  Feel free to call the clinic you have any questions or concerns. The clinic phone number is [phone].   I have been informed and understand all the instructions given to me. I know to contact the clinic, my physician, or go to the Emergency Department if any problems should occur. I do not have any questions at this time, but understand that I may call the clinic during office hours   should I have any questions or need assistance in obtaining follow up care.

## 2012-06-19 ENCOUNTER — Ambulatory Visit (HOSPITAL_BASED_OUTPATIENT_CLINIC_OR_DEPARTMENT_OTHER): Payer: Medicare Other

## 2012-06-19 ENCOUNTER — Telehealth: Payer: Self-pay | Admitting: Medical Oncology

## 2012-06-19 VITALS — BP 107/62 | HR 91 | Temp 97.6°F

## 2012-06-19 DIAGNOSIS — C3490 Malignant neoplasm of unspecified part of unspecified bronchus or lung: Secondary | ICD-10-CM

## 2012-06-19 DIAGNOSIS — C3410 Malignant neoplasm of upper lobe, unspecified bronchus or lung: Secondary | ICD-10-CM

## 2012-06-19 MED ORDER — PEGFILGRASTIM INJECTION 6 MG/0.6ML
6.0000 mg | Freq: Once | SUBCUTANEOUS | Status: AC
  Administered 2012-06-19: 6 mg via SUBCUTANEOUS
  Filled 2012-06-19: qty 0.6

## 2012-06-19 NOTE — Telephone Encounter (Signed)
I called [name] and left her a message regarding a port placement. [name] had requested a port. Per Dr. Arline Asp she has only 2 more cycles of chemo and if possible he would rather not place a port due to her having to stop her coumadin. I explained if she can not tolerate the IV sticks he will consider. I asked her to call me back to discuss further.

## 2012-06-23 ENCOUNTER — Other Ambulatory Visit (HOSPITAL_BASED_OUTPATIENT_CLINIC_OR_DEPARTMENT_OTHER): Payer: Medicare Other | Admitting: Lab

## 2012-06-23 ENCOUNTER — Telehealth: Payer: Self-pay | Admitting: Oncology

## 2012-06-23 DIAGNOSIS — C3410 Malignant neoplasm of upper lobe, unspecified bronchus or lung: Secondary | ICD-10-CM

## 2012-06-23 DIAGNOSIS — C3490 Malignant neoplasm of unspecified part of unspecified bronchus or lung: Secondary | ICD-10-CM

## 2012-06-23 LAB — CBC WITH DIFFERENTIAL/PLATELET
BASO%: 0.6 % (ref 0.0–2.0)
EOS%: 1.7 % (ref 0.0–7.0)
HCT: 38.9 % (ref 34.8–46.6)
LYMPH%: 20.2 % (ref 14.0–49.7)
MCH: 31.5 pg (ref 25.1–34.0)
MCHC: 33.4 g/dL (ref 31.5–36.0)
MCV: 94.2 fL (ref 79.5–101.0)
MONO#: 0.5 10*3/uL (ref 0.1–0.9)
MONO%: 4.7 % (ref 0.0–14.0)
NEUT%: 72.8 % (ref 38.4–76.8)
Platelets: 164 10*3/uL (ref 145–400)
RBC: 4.13 10*6/uL (ref 3.70–5.45)
WBC: 9.9 10*3/uL (ref 3.9–10.3)
nRBC: 0 % (ref 0–0)

## 2012-06-23 NOTE — Telephone Encounter (Signed)
Daughter stopped by to cx the lab that is on 1/16.  I as as Olegario Messier b looked  And the lab is not needed.    anne

## 2012-06-26 ENCOUNTER — Other Ambulatory Visit: Payer: Medicare Other | Admitting: Lab

## 2012-06-26 ENCOUNTER — Other Ambulatory Visit: Payer: Self-pay | Admitting: Oncology

## 2012-06-26 ENCOUNTER — Telehealth: Payer: Self-pay | Admitting: Medical Oncology

## 2012-06-26 DIAGNOSIS — C3490 Malignant neoplasm of unspecified part of unspecified bronchus or lung: Secondary | ICD-10-CM

## 2012-06-26 NOTE — Telephone Encounter (Signed)
Pt's daughter Delorise Royals called to discuss the port placement. I explained that Dr. Arline Asp really does not want to put the port in for just 2 more cycles. Per Dr. Arline Asp we can place a PICC the morning of her chemo treatments and we can either leave it in for the next cycle or we can pull it. She is in agreement. I called Inetta Fermo in IR and set up for a PICC 07/07/12 at 8 am arrival and I called [name] with this time. She voiced understanding.

## 2012-06-30 ENCOUNTER — Other Ambulatory Visit (HOSPITAL_BASED_OUTPATIENT_CLINIC_OR_DEPARTMENT_OTHER): Payer: Medicare Other | Admitting: Lab

## 2012-06-30 DIAGNOSIS — C3490 Malignant neoplasm of unspecified part of unspecified bronchus or lung: Secondary | ICD-10-CM

## 2012-06-30 DIAGNOSIS — C801 Malignant (primary) neoplasm, unspecified: Secondary | ICD-10-CM

## 2012-06-30 DIAGNOSIS — C3410 Malignant neoplasm of upper lobe, unspecified bronchus or lung: Secondary | ICD-10-CM

## 2012-06-30 LAB — CBC WITH DIFFERENTIAL/PLATELET
BASO%: 0.4 % (ref 0.0–2.0)
Basophils Absolute: 0.1 10*3/uL (ref 0.0–0.1)
EOS%: 0.6 % (ref 0.0–7.0)
HGB: 13.4 g/dL (ref 11.6–15.9)
MCH: 31.8 pg (ref 25.1–34.0)
MCHC: 33.6 g/dL (ref 31.5–36.0)
MCV: 94.8 fL (ref 79.5–101.0)
MONO%: 8.1 % (ref 0.0–14.0)
RBC: 4.21 10*6/uL (ref 3.70–5.45)
RDW: 16.5 % — ABNORMAL HIGH (ref 11.2–14.5)
lymph#: 2.6 10*3/uL (ref 0.9–3.3)

## 2012-07-07 ENCOUNTER — Other Ambulatory Visit (HOSPITAL_BASED_OUTPATIENT_CLINIC_OR_DEPARTMENT_OTHER): Payer: Medicare Other | Admitting: Lab

## 2012-07-07 ENCOUNTER — Ambulatory Visit (HOSPITAL_BASED_OUTPATIENT_CLINIC_OR_DEPARTMENT_OTHER): Payer: Medicare Other

## 2012-07-07 ENCOUNTER — Telehealth: Payer: Self-pay | Admitting: *Deleted

## 2012-07-07 ENCOUNTER — Ambulatory Visit (HOSPITAL_BASED_OUTPATIENT_CLINIC_OR_DEPARTMENT_OTHER): Payer: Medicare Other | Admitting: Physician Assistant

## 2012-07-07 ENCOUNTER — Other Ambulatory Visit: Payer: Medicare Other | Admitting: Lab

## 2012-07-07 ENCOUNTER — Ambulatory Visit (HOSPITAL_COMMUNITY)
Admission: RE | Admit: 2012-07-07 | Discharge: 2012-07-07 | Disposition: A | Discharge status: Home / Self Care | Payer: Medicare Other | Source: Ambulatory Visit | Attending: Oncology | Admitting: Oncology

## 2012-07-07 ENCOUNTER — Telehealth: Payer: Self-pay | Admitting: Oncology

## 2012-07-07 ENCOUNTER — Other Ambulatory Visit: Payer: Self-pay | Admitting: Oncology

## 2012-07-07 ENCOUNTER — Encounter: Payer: Self-pay | Admitting: Physician Assistant

## 2012-07-07 VITALS — BP 108/55 | HR 95 | Temp 98.1°F | Resp 20 | Ht 64.5 in | Wt 216.4 lb

## 2012-07-07 DIAGNOSIS — C3490 Malignant neoplasm of unspecified part of unspecified bronchus or lung: Secondary | ICD-10-CM | POA: Insufficient documentation

## 2012-07-07 DIAGNOSIS — Z86718 Personal history of other venous thrombosis and embolism: Secondary | ICD-10-CM

## 2012-07-07 DIAGNOSIS — I10 Essential (primary) hypertension: Secondary | ICD-10-CM

## 2012-07-07 DIAGNOSIS — Z859 Personal history of malignant neoplasm, unspecified: Secondary | ICD-10-CM

## 2012-07-07 DIAGNOSIS — C801 Malignant (primary) neoplasm, unspecified: Secondary | ICD-10-CM

## 2012-07-07 DIAGNOSIS — C3410 Malignant neoplasm of upper lobe, unspecified bronchus or lung: Secondary | ICD-10-CM

## 2012-07-07 DIAGNOSIS — Z5111 Encounter for antineoplastic chemotherapy: Secondary | ICD-10-CM

## 2012-07-07 LAB — COMPREHENSIVE METABOLIC PANEL (CC13)
AST: 20 U/L (ref 5–34)
Albumin: 3.5 g/dL (ref 3.5–5.0)
Alkaline Phosphatase: 55 U/L (ref 40–150)
Calcium: 9.8 mg/dL (ref 8.4–10.4)
Chloride: 104 mEq/L (ref 98–107)
Glucose: 118 mg/dl — ABNORMAL HIGH (ref 70–99)
Potassium: 3.9 mEq/L (ref 3.5–5.1)
Sodium: 139 mEq/L (ref 136–145)
Total Protein: 7.1 g/dL (ref 6.4–8.3)

## 2012-07-07 LAB — CBC WITH DIFFERENTIAL/PLATELET
BASO%: 0.3 % (ref 0.0–2.0)
Basophils Absolute: 0 10*3/uL (ref 0.0–0.1)
EOS%: 1.1 % (ref 0.0–7.0)
HCT: 38.3 % (ref 34.8–46.6)
HGB: 12.7 g/dL (ref 11.6–15.9)
MCH: 31.8 pg (ref 25.1–34.0)
MONO#: 0.9 10*3/uL (ref 0.1–0.9)
NEUT#: 7.7 10*3/uL — ABNORMAL HIGH (ref 1.5–6.5)
RDW: 16.9 % — ABNORMAL HIGH (ref 11.2–14.5)
WBC: 11.3 10*3/uL — ABNORMAL HIGH (ref 3.9–10.3)
lymph#: 2.4 10*3/uL (ref 0.9–3.3)

## 2012-07-07 MED ORDER — LIDOCAINE HCL 1 % IJ SOLN
INTRAMUSCULAR | Status: AC
  Filled 2012-07-07: qty 20

## 2012-07-07 MED ORDER — SODIUM CHLORIDE 0.9 % IV SOLN
350.0000 mg | Freq: Once | INTRAVENOUS | Status: AC
  Administered 2012-07-07: 350 mg via INTRAVENOUS
  Filled 2012-07-07: qty 35

## 2012-07-07 MED ORDER — DEXAMETHASONE SODIUM PHOSPHATE 4 MG/ML IJ SOLN
20.0000 mg | Freq: Once | INTRAMUSCULAR | Status: AC
  Administered 2012-07-07: 20 mg via INTRAVENOUS

## 2012-07-07 MED ORDER — ONDANSETRON 16 MG/50ML IVPB (CHCC)
16.0000 mg | Freq: Once | INTRAVENOUS | Status: AC
  Administered 2012-07-07: 16 mg via INTRAVENOUS

## 2012-07-07 MED ORDER — SODIUM CHLORIDE 0.9 % IV SOLN
Freq: Once | INTRAVENOUS | Status: AC
  Administered 2012-07-07: 15:00:00 via INTRAVENOUS

## 2012-07-07 MED ORDER — HEPARIN SOD (PORK) LOCK FLUSH 100 UNIT/ML IV SOLN
250.0000 [IU] | Freq: Once | INTRAVENOUS | Status: AC | PRN
  Administered 2012-07-07: 250 [IU]
  Filled 2012-07-07: qty 5

## 2012-07-07 MED ORDER — SODIUM CHLORIDE 0.9 % IJ SOLN
10.0000 mL | INTRAMUSCULAR | Status: DC | PRN
  Administered 2012-07-07: 10 mL
  Filled 2012-07-07: qty 10

## 2012-07-07 MED ORDER — CARBOPLATIN CHEMO INTRADERMAL TEST DOSE 100MCG/0.02ML
100.0000 ug | Freq: Once | INTRADERMAL | Status: AC
  Administered 2012-07-07: 100 ug via INTRADERMAL
  Filled 2012-07-07: qty 0.01

## 2012-07-07 MED ORDER — SODIUM CHLORIDE 0.9 % IV SOLN
90.0000 mg/m2 | Freq: Once | INTRAVENOUS | Status: AC
  Administered 2012-07-07: 190 mg via INTRAVENOUS
  Filled 2012-07-07: qty 9.5

## 2012-07-07 NOTE — Procedures (Signed)
Interventional Radiology Procedure Note  Procedure: Successful placement of a Right basilic vein dual lumen PowerPICC with the tip at the cavoatrial junction.  Catheter is ready for use. Complications:   None Recommendations: - Routine line care  Signed,  Sterling Big, MD Vascular & Interventional Radiologist [facility] Radiology

## 2012-07-07 NOTE — Telephone Encounter (Signed)
gv and printed appt schedule for pt for Jan and Feb..Marland KitchenS/w and emailed michelle

## 2012-07-07 NOTE — Progress Notes (Signed)
[facility]  [phone]   OFFICE PROGRESS NOTE  MOON,AMY, NP CC: Oneita Hurt, M.D.  [name], NP  Foye Deer, MD   Samul Dada, MD 06/16/2012 1:42 PM Signed   PROBLEM LIST:  1. 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  2. Squamous cell carcinoma of the right lower lobe T1 resected at the time of surgery on 08/09/2005. All resected lymph nodes were negative.  3. Non small cell lung cancer of the right upper lobe detected in March 2012, +NABx 09/21/10, with positive PET scan treated with stereotactic radiation 5000 cGy in 5 fractions from 10/17/2010 through 10/27/2010.  4. Lingular nodule which appears to be increasing in size. This lesion may have been present on a CT scan of the chest going back to 08/30/2010. A PET scan carried out on 03/14/2012 showed no evidence of metastatic disease. Core needle biopsy of the lingular nodule was carried out on 04/01/2012 and showed invasive poorly differentiated carcinoma with predominant small cell carcinoma component and minor squamous cell component. Stains for p63, chromogranin and synaptophysin were diffusely positive. Cytokeratin 5/6 was positive in the squamous cell carcinoma component, but negative in the rest of the tumor. The small cell component comprised about 90% of the tumor. Slides were reviewed with Dr. Hollice Espy. The lesion was treated with stereotactic body radiotherapy (SBRT), 18 Gy per session, for a total of 3 sessions, administered on 04/22/2012, 04/24/2012, and 04/29/2012, for a total of 54 Gy. The patient started chemotherapy with carboplatin and VP-16 on 05/19/2012 for a planned 4 cycles.  5. History of recurrent deep venous thrombosis and pulmonary  emboli most recently October 2012, currently on Coumadin.  6. Anticoagulation therapy with Coumadin.  7. History of diastolic congestive heart failure October 2012.  8. History of colitis and rectal bleeding most recently September 2012.  9. Degenerative disease of the spine status post decompression at L4- S1 in May 2006.  10.Hypertension.  11.Peripheral vascular disease.  12.Dyslipidemia.  13.History of colonic polyps with high-grade dysplasia noted in a tubular adenoma.  14.Hearing impairment requiring bilateral hearing aids since 2010.  15.History of a left frontal lobe lesion, possibly a meningioma, seen on MRI of the head in November 2006 and May 2007.  16.Systolic ejection murmur.  17.Chronic obstructive pulmonary disease.  18.S/P R PICC line insertion on 07/07/2012 per IR   INTERVAL HISTORY:[name] was seen today for follow up prior to her scheduled cycle 3 of chemotherapy,started on  05/19/2012 with VP16 and Carboplatin. . She tolerated her treatment well. She did have some nausea without vomiting. She had no other side effects from the treatment. VP16  Dose was increased  Cycle 2  Which was received on 06/16/2012. Neulasta was received on 06/19/12.  Back pain is well controlled. The patient had an episode of arrhythmia, possibly atrial fibrillation, which has been intermittent, placed on a  Heart monitor,under the direction of Dr. Allyson Sabal. No further  dizziness or lightheadedness associated with the arrhythmia. Denies fever, chills, night sweats, change in taste, no shortness of breath. No confusion. Labs are stable, with Hb of 12.7. WBC is 11.3 and Plts 213. CMET is pending. LDH on 1/6 was 158.   MEDICATIONS: Current Outpatient Prescriptions  Medication Sig Dispense Refill  . acetaminophen (TYLENOL) 650 MG  CR tablet Take 650 mg by mouth every 8 (eight) hours as needed. For pain      . alendronate (FOSAMAX) 70 MG tablet Take 70 mg by mouth every 7 (seven) days. Take with a full glass  of water on an empty stomach. On Tuesdays      . amLODipine-benazepril (LOTREL) 5-10 MG per capsule Take 1 capsule by mouth every morning.       . cimetidine (TAGAMET) 200 MG tablet Take 200 mg by mouth daily.        . diphenhydramine-acetaminophen (TYLENOL PM) 25-500 MG TABS Take 2 tablets by mouth at bedtime as needed. For sleeplessness      . fenofibrate (TRICOR) 145 MG tablet Take 134 mg by mouth every morning.       . furosemide (LASIX) 40 MG tablet Take 40 mg by mouth every morning.       . Glucosamine-Chondroit-Vit C-Mn (GLUCOSAMINE 1500 COMPLEX PO) Take 750 mg by mouth daily.      . methocarbamol (ROBAXIN) 500 MG tablet Take 500 mg by mouth every 8 (eight) hours as needed. For muscle spasms      . Multiple Vitamin (MULTIVITAMIN) tablet Take 1 tablet by mouth daily.      Marland Kitchen omega-3 acid ethyl esters (LOVAZA) 1 G capsule Take 2 g by mouth daily.      . ondansetron (ZOFRAN) 8 MG tablet Take 1 tablet (8 mg total) by mouth every 8 (eight) hours as needed.  30 tablet  3  . oxyCODONE-acetaminophen (PERCOCET) 5-325 MG per tablet Take 1 tablet by mouth every 4 (four) hours as needed. For pain      . potassium chloride SA (K-DUR,KLOR-CON) 20 MEQ tablet Take 1 tablet (20 mEq total) by mouth every other day.  15 tablet  1  . sennosides-docusate sodium (SENOKOT-S) 8.6-50 MG tablet Take 1 tablet by mouth daily as needed. For constipation      . simvastatin (ZOCOR) 80 MG tablet Take 80 mg by mouth at bedtime.       Marland Kitchen tiotropium (SPIRIVA) 18 MCG inhalation capsule Place 18 mcg into inhaler and inhale daily.        Marland Kitchen warfarin (COUMADIN) 3 MG tablet Take by mouth daily. Take 1/2 pill Sat and Sun, take whole pill weekdays       No current facility-administered medications for this visit.   Facility-Administered Medications Ordered in Other Visits  Medication Dose Route Frequency Provider Last Rate Last Dose  . lidocaine (XYLOCAINE) 1 % (with pres) injection            IMMUNIZATIONS:  1. Pneumovax was  given in October 2012.  2. Flu shot was administered in October 2013.  SMOKING HISTORY: The patient smoked up to a pack and a half of cigarettes a  day for over 50 years. She stopped smoking in June of 2005  ALLERGIES:  is allergic to aspirin.  REVIEW OF SYSTEMS:  The rest of the 14-point review of system was negative.   There were no vitals filed for this visit. Wt Readings from Last 3 Encounters:  06/16/12 216 lb 14.4 oz (98.385 kg)  06/12/12 217 lb (98.431 kg)  05/07/12 216 lb 9.6 oz (98.249 kg)      PHYSICAL EXAMINATION:  General:  well-nourished in no acute distress. She is in a wheelchair.   HEENT:  Eyes:  no scleral icterus.No oropharyngeal lesions.  Neck was without   thyromegaly. No cervical, supraclavicular or axillary adenopathy. Respiratory: lungs were clear bilaterally  without wheezing or crackles.  Cardiovascular:  Regular rate and rhythm, S1/S2, without murmur, rub or gallop.   GI:  abdomen was soft, flat, nontender, nondistended, without organomegaly.    Musculoskeletal:  no spinal tenderness of palpation of vertebral spine.   Skin exam was without ecchymosis, petechiae.  Extremities: R Picc line placed today by IR covered by bandage, patient states is somewhat tender.   Neuro exam :nonfocal.  Patient was able to get on and off exam table without assistance Attention was good. Language was appropriate.  Mood was normal without depression.  Speech was not pressured.  Thought content was not tangential.         LABORATORY/RADIOLOGY DATA:   Lab 07/07/12 1054 06/30/12 1402  WBC 11.3* 17.1*  HGB 12.7 13.4  HCT 38.3 39.9  PLT 213 129*  MCV 95.8 94.8  MCH 31.8 31.8  MCHC 33.2 33.6  RDW 16.9* 16.5*  LYMPHSABS 2.4 2.6  MONOABS 0.9 1.4*  EOSABS 0.1 0.1  BASOSABS 0.0 0.1  BANDABS -- --    CMP   No results found for this basename: NA:5,K:5,CL:5,CO2:5,GLUCOSE:5,BUN:5,CREATININE:5,GFRCGP,:5,CALCIUM:5,MG:5,AST:5,ALT:5,ALKPHOS:5,BILITOT:5 in the last 168  hours      Component Value Date/Time   BILITOT 0.34 06/16/2012 0933   BILITOT 0.4 11/08/2011 1343     Radiology Studies:  Ir Fluoro Guide Cv Line Right  07/07/2012  *RADIOLOGY REPORT*  PICC PLACEMENT WITH ULTRASOUND AND FLUOROSCOPIC  GUIDANCE  Clinical History: Small cell lung cancer, in need of durable central venous access for continued chemotherapy.  Fluoroscopy Time: 0.5 minutes.  Procedure:  The right arm was prepped with chlorhexidine, draped in the usual sterile fashion using maximum barrier technique (cap and mask, sterile gown, sterile gloves, large sterile sheet, hand hygiene and cutaneous antiseptic).  Local anesthesia was attained by infiltration with 1% lidocaine.  Ultrasound demonstrated patency of the right basilic vein, and this was documented with an image.  Under real-time ultrasound guidance, this vein was accessed with a 21 gauge micropuncture needle and image documentation was performed.  The needle was exchanged over a guidewire for a peel-away sheath through which a 39 cm 5 [place] dual lumen power injectable PICC was advanced, and positioned with its tip at the lower SVC/right atrial junction.  Fluoroscopy during the procedure and fluoro spot radiograph confirms appropriate catheter position.  The catheter was flushed, secured to the skin with Prolene sutures, and covered with a sterile dressing.  Complications:  None.  The patient tolerated the procedure well.  IMPRESSION:  Successful placement of a right upper extremity brachial vein approach dual lumen power PICC with sonographic and fluoroscopic guidance.  The catheter is ready for use.  Signed,  Sterling Big, MD Vascular & Interventional Radiologist [facility]   Original Report Authenticated By: Malachy Moan, M.D.    Ir US Guide Vasc Access Right  07/07/2012  *RADIOLOGY REPORT*  PICC PLACEMENT WITH ULTRASOUND AND FLUOROSCOPIC  GUIDANCE  Clinical History: Small cell lung cancer, in need of durable central  venous access for continued chemotherapy.  Fluoroscopy Time: 0.5 minutes.  Procedure:  The right arm was prepped with chlorhexidine, draped in the usual sterile fashion using maximum barrier technique (cap and mask, sterile gown, sterile gloves, large sterile sheet, hand hygiene and cutaneous antiseptic).  Local anesthesia was attained by infiltration with 1% lidocaine.  Ultrasound demonstrated patency of the right basilic vein, and this was documented with an image.  Under real-time ultrasound guidance, this vein was accessed with a 21 gauge micropuncture needle and image  documentation was performed.  The needle was exchanged over a guidewire for a peel-away sheath through which a 39 cm 5 [place] dual lumen power injectable PICC was advanced, and positioned with its tip at the lower SVC/right atrial junction.  Fluoroscopy during the procedure and fluoro spot radiograph confirms appropriate catheter position.  The catheter was flushed, secured to the skin with Prolene sutures, and covered with a sterile dressing.  Complications:  None.  The patient tolerated the procedure well.  IMPRESSION:  Successful placement of a right upper extremity brachial vein approach dual lumen power PICC with sonographic and fluoroscopic guidance.  The catheter is ready for use.  Signed,  Sterling Big, MD Vascular & Interventional Radiologist [facility]   Original Report Authenticated By: Malachy Moan, M.D.    IMAGING STUDIES:  1. PET scan from 09/07/2010 showed hypermetabolic right upper lobe pulmonary nodule, most consistent with bronchogenic carcinoma. SUV max was 7.4. There was hypermetabolic activity associated with the left adrenal gland nodule. This was favored to be a lipid poor adenoma or hyperplasia.  2. MRI of the head with and without IV contrast on 10/04/2010 showed no acute or metastatic intracranial abnormality. There was stable MRI appearane of the brain as compared with a prior study of  11/02/2005. There were acute on chronic sphenoid sinus inflammatory changes.  3. CT scan of the chest without IV contrast on 01/30/2011 showed interval reduction in the size of the right lobe pulmonary nodule without evidence of local recurrence. No evidence for mediastinal metastatic disease. Left adrenal adenoma was noted. CT scan of abdomen and pelvis with IV contrast on 02/22/2011 showed no acute findings. Hepatic steatosis was present. The patient has had previous hysterectomy. Both adnexa were unremarkable. No masses  or lymphadenopathy noted within the abdomen or pelvis. . CT angiogram of the chest on 06/30/2011 showed no evidence for pulmonary embolism. The patient with status post right lower lobectomy with no evidence of recurrent or metastatic disease in the chest.  5. CT scan of the chest without IV contrast on 08/02/2011 showed a lingular nodule, small in size measuring 4 mm, more prominent than on the prior study of 01/30/2011 in retrospect. A malignant lesion was favored. There was focal air space consolidation in the superior segment of the left lower lobe felt to be new compared with the study of 06/30/2011, most consistent with infectious or inflammatory etiology. There were postoperative changes consistent with a right lower lobectomy. There was extensive atherosclerotic calcification of the arterial vasculature and pulmonary arterial hypertension.  6. Bone scan carried out on 08/20/2011 showed no evidence for bone metastases. There was increased radio tracer uptake within the posterior aspect of the lumbar spine consistent with chronic post- surgical change.  7. CT scan of the chest without IV contrast on 10/31/2011 showed continued interval enlargement of a now 5 mm nodule in the lingula seen on image 27 of series 5 suspicious for a small malignant nodule. Continued followup was suggested. It was felt that this lesion was below the resolution of a PET scan and too small to biopsy. The  patient was noted to be status post right lower lobectomy. There was also extensive atherosclerosis involving the thoracic aorta, great vessels in the mediastinum and the coronary arteries including calcified atherosclerotic plaque in the left main, left anterior descending, left circuflex, and right coronary arteries. There was also extensive calcification of the aortic valve and mitral annulus. There was a small hiatal hernia. There were no pathologically enlarged mediastinal  or hilar lymph nodes.  8. CT scan of chest without IV contrast on 02/14/2012 showed enlargement of the lingular nodule consistent with neoplasm. There was pulmonary  artery enlargement suggesting pulmonary arterial hypertension. Hepatic steatosis and probable hepatomegaly were noted. 9. PET scan on 03/14/2012 showed a 9 x 13 mm lingular nodule with maximum SUV of 3.9, suspicious for primary bronchogenic neoplasm. A metachronous primary was favored. There was no evidence for metastatic disease. The patient was status post right lower lobectomy.  10. CT-guided core needle biopsy of the left lung lesion was carried out on 04/01/2012.  11. Chest x-ray, 1 view, from 04/01/2012 showed the nodule in the lingula surrounded by a halo of airspace disease. There was no pulmonary edema or pneumothorax. There was stable focal sclerosis in the proximal left humerus.  12. Chest x-ray, 2 view, from 05/13/2012 showed no acute cardiopulmonary disease. There were stable post surgical changes and volume loss of the  right lung as well as the known nodule over the lingula.    ASSESSMENT AND PLAN:    Ms. Schiller did well following her first cycle of chemotherapy which was initiated on 05/19/2012. We will go ahead with cycle 3 VP-16 190 mg  given over 3 days  consecutively starting today. Carboplatin will remain the same at 350 mg day The patient will come back on Thursday January 30 for Neulasta 6 mg subcu. We will  Not check a nadir CBC since patient  does not show any significant hematological issues related to chemo. Will see Ms. Lovick again in 3 weeks which will be around February 10  at which time she will have CBC and chemistries prior to cycle 4.Will apply EMLA cream prior to the chemo at the Picc line. The patient was accompanied today by her daughter, Mariana Kaufman.

## 2012-07-07 NOTE — Patient Instructions (Signed)
[facility] Discharge Instructions for Patients Receiving Chemotherapy  Today you received the following chemotherapy agents: Carboplatin and Etoposide. To help prevent nausea and vomiting after your treatment, we encourage you to take your nausea medication: Zofran. Begin taking it the morning of 07/08/12 and take it as often as prescribed for the next 72 hours.   If you develop nausea and vomiting that is not controlled by your nausea medication, call the clinic. If it is after clinic hours your family physician or the after hours number for the clinic or go to the Emergency Department.   BELOW ARE SYMPTOMS THAT SHOULD BE REPORTED IMMEDIATELY:  *FEVER GREATER THAN 100.5 F  *CHILLS WITH OR WITHOUT FEVER  NAUSEA AND VOMITING THAT IS NOT CONTROLLED WITH YOUR NAUSEA MEDICATION  *UNUSUAL SHORTNESS OF BREATH  *UNUSUAL BRUISING OR BLEEDING  TENDERNESS IN MOUTH AND THROAT WITH OR WITHOUT PRESENCE OF ULCERS  *URINARY PROBLEMS  *BOWEL PROBLEMS  UNUSUAL RASH Items with * indicate a potential emergency and should be followed up as soon as possible.  Feel free to call the clinic you have any questions or concerns. The clinic phone number is [phone].   I have been informed and understand all the instructions given to me. I know to contact the clinic, my physician, or go to the Emergency Department if any problems should occur. I do not have any questions at this time, but understand that I may call the clinic during office hours   should I have any questions or need assistance in obtaining follow up care.

## 2012-07-07 NOTE — Progress Notes (Signed)
1406: Carbo skin test placed L anterior forearm. Carbo reaction teaching done, daughter verbalized understanding, pt needs reinforcement of teaching.  1411: Carbo skin test unremarkable.  1421: Skin test unremarkble.  1436: Carbo skin test negative.

## 2012-07-07 NOTE — Telephone Encounter (Signed)
Per staff phone call and POF I have schedueld appts.  JMW  

## 2012-07-08 ENCOUNTER — Other Ambulatory Visit: Payer: Self-pay | Admitting: Medical Oncology

## 2012-07-08 ENCOUNTER — Ambulatory Visit (HOSPITAL_BASED_OUTPATIENT_CLINIC_OR_DEPARTMENT_OTHER): Payer: Medicare Other

## 2012-07-08 ENCOUNTER — Telehealth: Payer: Self-pay | Admitting: Medical Oncology

## 2012-07-08 ENCOUNTER — Other Ambulatory Visit: Payer: Self-pay | Admitting: Oncology

## 2012-07-08 VITALS — BP 98/53 | HR 91 | Temp 97.1°F | Resp 18

## 2012-07-08 DIAGNOSIS — C3410 Malignant neoplasm of upper lobe, unspecified bronchus or lung: Secondary | ICD-10-CM

## 2012-07-08 DIAGNOSIS — C3490 Malignant neoplasm of unspecified part of unspecified bronchus or lung: Secondary | ICD-10-CM

## 2012-07-08 DIAGNOSIS — Z5111 Encounter for antineoplastic chemotherapy: Secondary | ICD-10-CM

## 2012-07-08 MED ORDER — SODIUM CHLORIDE 0.9 % IV SOLN
Freq: Once | INTRAVENOUS | Status: AC
  Administered 2012-07-08: 10:00:00 via INTRAVENOUS

## 2012-07-08 MED ORDER — PROCHLORPERAZINE MALEATE 10 MG PO TABS
10.0000 mg | ORAL_TABLET | Freq: Once | ORAL | Status: AC
  Administered 2012-07-08: 10 mg via ORAL

## 2012-07-08 MED ORDER — HEPARIN SOD (PORK) LOCK FLUSH 100 UNIT/ML IV SOLN
500.0000 [IU] | Freq: Once | INTRAVENOUS | Status: AC | PRN
  Administered 2012-07-08: 500 [IU]
  Filled 2012-07-08: qty 5

## 2012-07-08 MED ORDER — SODIUM CHLORIDE 0.9 % IV SOLN
90.0000 mg/m2 | Freq: Once | INTRAVENOUS | Status: AC
  Administered 2012-07-08: 190 mg via INTRAVENOUS
  Filled 2012-07-08: qty 9.5

## 2012-07-08 MED ORDER — SODIUM CHLORIDE 0.9 % IJ SOLN
10.0000 mL | INTRAMUSCULAR | Status: DC | PRN
  Administered 2012-07-08: 10 mL
  Filled 2012-07-08: qty 10

## 2012-07-08 NOTE — Telephone Encounter (Signed)
I called [name] to let her know that her mother's MD appointment is scheduled a day when Dr. Arline Asp is out of the office. We are going to reschedule to 2/18 with chemo and chemo 2/19 and 2/20. I also let her know since her mother has power PICC it only has to be flushed weekly with dressing changes. This way she does not have to come in 3 times a week or flush at home. She will pick up a new calender tomorrow.

## 2012-07-08 NOTE — Patient Instructions (Signed)
[facility] Discharge Instructions for Patients Receiving Chemotherapy  Today you received the following chemotherapy agent: Etoposide  To help prevent nausea and vomiting after your treatment, we encourage you to take your nausea medication:  Zofran 8 mg every 8 hours as needed for nausea Begin taking it at 1st sign of nausea and take it as often as prescribed.   If you develop nausea and vomiting that is not controlled by your nausea medication, call the clinic. If it is after clinic hours your family physician or the after hours number for the clinic or go to the Emergency Department.   BELOW ARE SYMPTOMS THAT SHOULD BE REPORTED IMMEDIATELY:  *FEVER GREATER THAN 100.5 F  *CHILLS WITH OR WITHOUT FEVER  NAUSEA AND VOMITING THAT IS NOT CONTROLLED WITH YOUR NAUSEA MEDICATION  *UNUSUAL SHORTNESS OF BREATH  *UNUSUAL BRUISING OR BLEEDING  TENDERNESS IN MOUTH AND THROAT WITH OR WITHOUT PRESENCE OF ULCERS  *URINARY PROBLEMS  *BOWEL PROBLEMS  UNUSUAL RASH Items with * indicate a potential emergency and should be followed up as soon as possible.   Feel free to call the clinic you have any questions or concerns. The clinic phone number is [phone].   I have been informed and understand all the instructions given to me. I know to contact the clinic, my physician, or go to the Emergency Department if any problems should occur. I do not have any questions at this time, but understand that I may call the clinic during office hours   should I have any questions or need assistance in obtaining follow up care.    __________________________________________  _____________  __________ Signature of Patient or Authorized Representative            Date                   Time    __________________________________________ Nurse's Signature

## 2012-07-09 ENCOUNTER — Telehealth: Payer: Self-pay | Admitting: Oncology

## 2012-07-09 ENCOUNTER — Ambulatory Visit: Payer: Medicare Other

## 2012-07-09 ENCOUNTER — Telehealth: Payer: Self-pay

## 2012-07-09 ENCOUNTER — Telehealth: Payer: Self-pay | Admitting: *Deleted

## 2012-07-09 NOTE — Telephone Encounter (Signed)
New appts made and adjusted,note left for pt to get a new sch today at tx appt

## 2012-07-09 NOTE — Telephone Encounter (Signed)
S/w daughter that they missed today's infusion appt due to poor road conditions. This was supposed to be D3C3 - etoposide. Neulasta due 1/30. I will ask Dr Arline Asp how to handle the scheduling with this. I will call [name] back with instructions tomorrow AM.  [name] expressed understanding.

## 2012-07-09 NOTE — Telephone Encounter (Signed)
Per scheduler I have moved appts for 2/17 down by one day.     JMW

## 2012-07-10 ENCOUNTER — Telehealth: Payer: Self-pay

## 2012-07-10 ENCOUNTER — Ambulatory Visit: Payer: Medicare Other

## 2012-07-10 ENCOUNTER — Ambulatory Visit (HOSPITAL_BASED_OUTPATIENT_CLINIC_OR_DEPARTMENT_OTHER): Payer: Medicare Other

## 2012-07-10 VITALS — BP 115/61 | HR 90 | Temp 98.9°F | Resp 17

## 2012-07-10 DIAGNOSIS — C3490 Malignant neoplasm of unspecified part of unspecified bronchus or lung: Secondary | ICD-10-CM

## 2012-07-10 DIAGNOSIS — C3410 Malignant neoplasm of upper lobe, unspecified bronchus or lung: Secondary | ICD-10-CM

## 2012-07-10 DIAGNOSIS — Z5111 Encounter for antineoplastic chemotherapy: Secondary | ICD-10-CM

## 2012-07-10 MED ORDER — PROCHLORPERAZINE MALEATE 10 MG PO TABS
10.0000 mg | ORAL_TABLET | Freq: Once | ORAL | Status: AC
  Administered 2012-07-10: 10 mg via ORAL

## 2012-07-10 MED ORDER — SODIUM CHLORIDE 0.9 % IJ SOLN
10.0000 mL | INTRAMUSCULAR | Status: DC | PRN
  Administered 2012-07-10: 10 mL
  Filled 2012-07-10: qty 10

## 2012-07-10 MED ORDER — SODIUM CHLORIDE 0.9 % IV SOLN
Freq: Once | INTRAVENOUS | Status: AC
  Administered 2012-07-10: 15:00:00 via INTRAVENOUS

## 2012-07-10 MED ORDER — SODIUM CHLORIDE 0.9 % IV SOLN
90.0000 mg/m2 | Freq: Once | INTRAVENOUS | Status: AC
  Administered 2012-07-10: 190 mg via INTRAVENOUS
  Filled 2012-07-10: qty 9.5

## 2012-07-10 MED ORDER — HEPARIN SOD (PORK) LOCK FLUSH 100 UNIT/ML IV SOLN
250.0000 [IU] | Freq: Once | INTRAVENOUS | Status: AC | PRN
  Administered 2012-07-10: 250 [IU]
  Filled 2012-07-10: qty 5

## 2012-07-10 NOTE — Telephone Encounter (Signed)
S/w adelia that Dr Arline Asp wants the etoposide to be given. appt today at 245 for etoposide and 1115 tomorrow for injection. [name] expressed understanding.

## 2012-07-10 NOTE — Patient Instructions (Addendum)
[facility] Discharge Instructions for Patients Receiving Chemotherapy  Today you received the following chemotherapy agent Etoposide (VP-16)  To help prevent nausea and vomiting after your treatment, we encourage you to take your nausea medication. Begin taking your nausea medication as often as prescribed for by Dr. Arline Asp.   If you develop nausea and vomiting that is not controlled by your nausea medication, call the clinic. If it is after clinic hours your family physician or the after hours number for the clinic or go to the Emergency Department.   BELOW ARE SYMPTOMS THAT SHOULD BE REPORTED IMMEDIATELY:  *FEVER GREATER THAN 100.5 F  *CHILLS WITH OR WITHOUT FEVER  NAUSEA AND VOMITING THAT IS NOT CONTROLLED WITH YOUR NAUSEA MEDICATION  *UNUSUAL SHORTNESS OF BREATH  *UNUSUAL BRUISING OR BLEEDING  TENDERNESS IN MOUTH AND THROAT WITH OR WITHOUT PRESENCE OF ULCERS  *URINARY PROBLEMS  *BOWEL PROBLEMS  UNUSUAL RASH Items with * indicate a potential emergency and should be followed up as soon as possible.  One of the nurses will contact you 24 hours after your treatment. Please let the nurse know about any problems that you may have experienced. Feel free to call the clinic you have any questions or concerns. The clinic phone number is [phone].   I have been informed and understand all the instructions given to me. I know to contact the clinic, my physician, or go to the Emergency Department if any problems should occur. I do not have any questions at this time, but understand that I may call the clinic during office hours   should I have any questions or need assistance in obtaining follow up care.    __________________________________________  _____________  __________ Signature of Patient or Authorized Representative            Date                   Time    __________________________________________ Nurse's Signature

## 2012-07-11 ENCOUNTER — Ambulatory Visit (HOSPITAL_BASED_OUTPATIENT_CLINIC_OR_DEPARTMENT_OTHER): Payer: Medicare Other

## 2012-07-11 VITALS — BP 98/57 | HR 90 | Temp 98.1°F

## 2012-07-11 DIAGNOSIS — C3490 Malignant neoplasm of unspecified part of unspecified bronchus or lung: Secondary | ICD-10-CM

## 2012-07-11 DIAGNOSIS — C3410 Malignant neoplasm of upper lobe, unspecified bronchus or lung: Secondary | ICD-10-CM

## 2012-07-11 MED ORDER — PEGFILGRASTIM INJECTION 6 MG/0.6ML
6.0000 mg | Freq: Once | SUBCUTANEOUS | Status: AC
  Administered 2012-07-11: 6 mg via SUBCUTANEOUS
  Filled 2012-07-11: qty 0.6

## 2012-07-14 ENCOUNTER — Other Ambulatory Visit: Payer: Medicare Other | Admitting: Lab

## 2012-07-15 ENCOUNTER — Ambulatory Visit (HOSPITAL_BASED_OUTPATIENT_CLINIC_OR_DEPARTMENT_OTHER): Payer: Medicare Other

## 2012-07-15 VITALS — BP 94/64 | HR 80 | Temp 97.9°F | Resp 20

## 2012-07-15 DIAGNOSIS — C3490 Malignant neoplasm of unspecified part of unspecified bronchus or lung: Secondary | ICD-10-CM

## 2012-07-15 DIAGNOSIS — Z452 Encounter for adjustment and management of vascular access device: Secondary | ICD-10-CM

## 2012-07-15 DIAGNOSIS — C3410 Malignant neoplasm of upper lobe, unspecified bronchus or lung: Secondary | ICD-10-CM

## 2012-07-15 MED ORDER — SODIUM CHLORIDE 0.9 % IJ SOLN
10.0000 mL | INTRAMUSCULAR | Status: DC | PRN
  Administered 2012-07-15: 10 mL via INTRAVENOUS
  Filled 2012-07-15: qty 10

## 2012-07-15 MED ORDER — HEPARIN SOD (PORK) LOCK FLUSH 100 UNIT/ML IV SOLN
500.0000 [IU] | Freq: Once | INTRAVENOUS | Status: AC
  Administered 2012-07-15: 500 [IU] via INTRAVENOUS
  Filled 2012-07-15: qty 5

## 2012-07-21 ENCOUNTER — Other Ambulatory Visit: Payer: Medicare Other | Admitting: Lab

## 2012-07-22 ENCOUNTER — Ambulatory Visit (HOSPITAL_BASED_OUTPATIENT_CLINIC_OR_DEPARTMENT_OTHER): Payer: Medicare Other

## 2012-07-22 VITALS — BP 97/60 | HR 88 | Temp 98.3°F

## 2012-07-22 DIAGNOSIS — C3410 Malignant neoplasm of upper lobe, unspecified bronchus or lung: Secondary | ICD-10-CM

## 2012-07-22 DIAGNOSIS — Z452 Encounter for adjustment and management of vascular access device: Secondary | ICD-10-CM

## 2012-07-22 DIAGNOSIS — C3490 Malignant neoplasm of unspecified part of unspecified bronchus or lung: Secondary | ICD-10-CM

## 2012-07-22 MED ORDER — SODIUM CHLORIDE 0.9 % IJ SOLN
10.0000 mL | INTRAMUSCULAR | Status: AC | PRN
  Administered 2012-07-22: 10 mL
  Filled 2012-07-22: qty 10

## 2012-07-22 MED ORDER — SODIUM CHLORIDE 0.9 % IJ SOLN
10.0000 mL | INTRAMUSCULAR | Status: DC | PRN
  Filled 2012-07-22: qty 10

## 2012-07-22 MED ORDER — HEPARIN SOD (PORK) LOCK FLUSH 100 UNIT/ML IV SOLN
500.0000 [IU] | INTRAVENOUS | Status: DC | PRN
  Filled 2012-07-22: qty 5

## 2012-07-22 MED ORDER — HEPARIN SOD (PORK) LOCK FLUSH 100 UNIT/ML IV SOLN
250.0000 [IU] | INTRAVENOUS | Status: AC | PRN
  Administered 2012-07-22: 250 [IU]
  Filled 2012-07-22: qty 5

## 2012-07-24 ENCOUNTER — Other Ambulatory Visit: Payer: Self-pay | Admitting: Oncology

## 2012-07-25 ENCOUNTER — Other Ambulatory Visit: Payer: Self-pay

## 2012-07-25 DIAGNOSIS — C3410 Malignant neoplasm of upper lobe, unspecified bronchus or lung: Secondary | ICD-10-CM

## 2012-07-28 ENCOUNTER — Other Ambulatory Visit: Payer: Medicare Other | Admitting: Lab

## 2012-07-28 ENCOUNTER — Ambulatory Visit: Payer: Medicare Other | Admitting: Physician Assistant

## 2012-07-28 ENCOUNTER — Ambulatory Visit: Payer: Medicare Other

## 2012-07-29 ENCOUNTER — Telehealth: Payer: Self-pay | Admitting: Oncology

## 2012-07-29 ENCOUNTER — Ambulatory Visit (HOSPITAL_BASED_OUTPATIENT_CLINIC_OR_DEPARTMENT_OTHER): Payer: Medicare Other

## 2012-07-29 ENCOUNTER — Ambulatory Visit (HOSPITAL_BASED_OUTPATIENT_CLINIC_OR_DEPARTMENT_OTHER): Payer: Medicare Other | Admitting: Oncology

## 2012-07-29 ENCOUNTER — Other Ambulatory Visit (HOSPITAL_BASED_OUTPATIENT_CLINIC_OR_DEPARTMENT_OTHER): Payer: Medicare Other | Admitting: Lab

## 2012-07-29 ENCOUNTER — Encounter: Payer: Self-pay | Admitting: Oncology

## 2012-07-29 VITALS — BP 112/49 | HR 88 | Temp 98.4°F | Resp 20 | Ht 64.5 in | Wt 214.8 lb

## 2012-07-29 DIAGNOSIS — Z5111 Encounter for antineoplastic chemotherapy: Secondary | ICD-10-CM

## 2012-07-29 DIAGNOSIS — C3490 Malignant neoplasm of unspecified part of unspecified bronchus or lung: Secondary | ICD-10-CM

## 2012-07-29 DIAGNOSIS — C3410 Malignant neoplasm of upper lobe, unspecified bronchus or lung: Secondary | ICD-10-CM

## 2012-07-29 DIAGNOSIS — Z86718 Personal history of other venous thrombosis and embolism: Secondary | ICD-10-CM

## 2012-07-29 DIAGNOSIS — Z86711 Personal history of pulmonary embolism: Secondary | ICD-10-CM

## 2012-07-29 DIAGNOSIS — Z859 Personal history of malignant neoplasm, unspecified: Secondary | ICD-10-CM

## 2012-07-29 LAB — CBC WITH DIFFERENTIAL/PLATELET
BASO%: 0.3 % (ref 0.0–2.0)
EOS%: 1.1 % (ref 0.0–7.0)
LYMPH%: 23 % (ref 14.0–49.7)
MCHC: 33.5 g/dL (ref 31.5–36.0)
MCV: 97.1 fL (ref 79.5–101.0)
MONO%: 12.1 % (ref 0.0–14.0)
Platelets: 207 10*3/uL (ref 145–400)
RBC: 3.81 10*6/uL (ref 3.70–5.45)
RDW: 17.7 % — ABNORMAL HIGH (ref 11.2–14.5)
nRBC: 0 % (ref 0–0)

## 2012-07-29 LAB — COMPREHENSIVE METABOLIC PANEL (CC13)
ALT: 19 U/L (ref 0–55)
AST: 22 U/L (ref 5–34)
Alkaline Phosphatase: 65 U/L (ref 40–150)
Creatinine: 1.2 mg/dL — ABNORMAL HIGH (ref 0.6–1.1)
Total Bilirubin: 0.34 mg/dL (ref 0.20–1.20)

## 2012-07-29 LAB — LACTATE DEHYDROGENASE (CC13): LDH: 180 U/L (ref 125–245)

## 2012-07-29 MED ORDER — SODIUM CHLORIDE 0.9 % IV SOLN
350.0000 mg | Freq: Once | INTRAVENOUS | Status: AC
  Administered 2012-07-29: 350 mg via INTRAVENOUS
  Filled 2012-07-29: qty 35

## 2012-07-29 MED ORDER — DEXAMETHASONE SODIUM PHOSPHATE 4 MG/ML IJ SOLN
20.0000 mg | Freq: Once | INTRAMUSCULAR | Status: AC
  Administered 2012-07-29: 20 mg via INTRAVENOUS

## 2012-07-29 MED ORDER — ONDANSETRON 16 MG/50ML IVPB (CHCC)
16.0000 mg | Freq: Once | INTRAVENOUS | Status: AC
  Administered 2012-07-29: 16 mg via INTRAVENOUS

## 2012-07-29 MED ORDER — SODIUM CHLORIDE 0.9 % IV SOLN
Freq: Once | INTRAVENOUS | Status: AC
  Administered 2012-07-29: 15:00:00 via INTRAVENOUS

## 2012-07-29 MED ORDER — SODIUM CHLORIDE 0.9 % IV SOLN
90.0000 mg/m2 | Freq: Once | INTRAVENOUS | Status: AC
  Administered 2012-07-29: 190 mg via INTRAVENOUS
  Filled 2012-07-29: qty 9.5

## 2012-07-29 MED ORDER — CARBOPLATIN CHEMO INTRADERMAL TEST DOSE 100MCG/0.02ML
100.0000 ug | Freq: Once | INTRADERMAL | Status: AC
  Administered 2012-07-29: 100 ug via INTRADERMAL
  Filled 2012-07-29: qty 0.01

## 2012-07-29 NOTE — Patient Instructions (Signed)
[facility] Discharge Instructions for Patients Receiving Chemotherapy  Today you received the following chemotherapy agents Etoposide (VP 16)/ Carboplatin To help prevent nausea and vomiting after your treatment, we encourage you to take your nausea medication as prescribed.  If you develop nausea and vomiting that is not controlled by your nausea medication, call the clinic. If it is after clinic hours your family physician or the after hours number for the clinic or go to the Emergency Department.   BELOW ARE SYMPTOMS THAT SHOULD BE REPORTED IMMEDIATELY:  *FEVER GREATER THAN 100.5 F  *CHILLS WITH OR WITHOUT FEVER  NAUSEA AND VOMITING THAT IS NOT CONTROLLED WITH YOUR NAUSEA MEDICATION  *UNUSUAL SHORTNESS OF BREATH  *UNUSUAL BRUISING OR BLEEDING  TENDERNESS IN MOUTH AND THROAT WITH OR WITHOUT PRESENCE OF ULCERS  *URINARY PROBLEMS  *BOWEL PROBLEMS  UNUSUAL RASH Items with * indicate a potential emergency and should be followed up as soon as possible.  One of the nurses will contact you 24 hours after your treatment. Please let the nurse know about any problems that you may have experienced. Feel free to call the clinic you have any questions or concerns. The clinic phone number is [phone].   I have been informed and understand all the instructions given to me. I know to contact the clinic, my physician, or go to the Emergency Department if any problems should occur. I do not have any questions at this time, but understand that I may call the clinic during office hours   should I have any questions or need assistance in obtaining follow up care.    __________________________________________  _____________  __________ Signature of Patient or Authorized Representative            Date                   Time    __________________________________________ Nurse's Signature

## 2012-07-29 NOTE — Telephone Encounter (Signed)
gv and printed appt scheduel for Feb thru April .Marland KitchenMarland Kitchenpt aware cs wil contact with d/t of ct

## 2012-07-29 NOTE — Progress Notes (Signed)
This office note has been dictated.  #[id number]

## 2012-07-30 ENCOUNTER — Ambulatory Visit (HOSPITAL_BASED_OUTPATIENT_CLINIC_OR_DEPARTMENT_OTHER): Payer: Medicare Other

## 2012-07-30 VITALS — BP 100/63 | HR 85 | Temp 98.3°F

## 2012-07-30 MED ORDER — PROCHLORPERAZINE MALEATE 10 MG PO TABS
10.0000 mg | ORAL_TABLET | Freq: Once | ORAL | Status: AC
  Administered 2012-07-30: 10 mg via ORAL

## 2012-07-30 MED ORDER — SODIUM CHLORIDE 0.9 % IV SOLN
90.0000 mg/m2 | Freq: Once | INTRAVENOUS | Status: AC
  Administered 2012-07-30: 190 mg via INTRAVENOUS
  Filled 2012-07-30: qty 9.5

## 2012-07-30 NOTE — Progress Notes (Signed)
CC:   Oneita Hurt, M.D. [name], NP Foye Deer, MD  PROBLEM LIST:  1. 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  2. Squamous cell carcinoma of the right lower lobe T1 resected at the  time of surgery on 08/09/2005. All resected lymph nodes were  negative.  3. Non small cell lung cancer of the right upper lobe detected in March  2012, +NABx 09/21/10, with positive PET scan treated with stereotactic  radiation 5000 cGy in 5 fractions from 10/17/2010 through 10/27/2010.  4. Lingular nodule which appears to be increasing in size. This  lesion may have been present on a CT scan of the chest going back  to 08/30/2010. A PET scan carried out on 03/14/2012 showed no evidence of metastatic disease. Core needle biopsy of the lingular nodule was carried out on 04/01/2012 and showed invasive poorly differentiated carcinoma with predominant small cell carcinoma component and minor squamous cell component. Stains for p63, chromogranin and synaptophysin were diffusely positive. Cytokeratin 5/6 was positive in the squamous cell carcinoma component, but negative in the rest of the tumor. The small cell component comprised about 90%  of the tumor. Slides were reviewed with Dr. Hollice Espy. The lesion was  treated with stereotactic body radiotherapy (SBRT), 18 Gy per session, for  a total of 3 sessions, administered on 04/22/2012, 04/24/2012, and 04/29/2012, for a total of 54 Gy. The patient started chemotherapy with carboplatin and VP-16 on 05/19/2012 for a planned 4 cycles. Cycle #4 was completed on 07/31/2012. 5. 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  6. Anticoagulation therapy  with Coumadin.  7. History of diastolic congestive heart failure October 2012.  8. History of colitis and rectal bleeding most recently September  2012.  9. Degenerative disease of the spine status post decompression at L4-  S1 in May 2006.  10. Hypertension.  11. Peripheral vascular disease.  12. Dyslipidemia.  13. History of colonic polyps with high-grade dysplasia noted in a  tubular adenoma.  14. Hearing impairment requiring bilateral hearing aids since 2010.  15. History of a left frontal lobe lesion, possibly a meningioma, seen  on MRI of the head in November 2006 and May 2007.  16. Systolic ejection murmur.  17. Chronic obstructive pulmonary disease. 18. Power PICC line was inserted in the right upper extremity on     07/07/2012 by Interventional Radiology.  The PICC line was used for     cycles 3 and 4 and will be removed following the final day of     chemotherapy on 07/31/2012.    MEDICATIONS:  Reviewed and recorded. Current Outpatient Prescriptions  Medication Sig Dispense Refill  . acetaminophen (TYLENOL) 650 MG CR tablet Take 650 mg by mouth every 8 (eight) hours as needed. For pain      . alendronate (FOSAMAX) 70 MG tablet Take 70 mg by mouth every 7 (seven) days. Take with a full glass of water on an empty stomach. On Tuesdays      . cimetidine (TAGAMET) 200 MG tablet Take 200 mg by mouth daily.        . diphenhydramine-acetaminophen (TYLENOL PM) 25-500 MG TABS Take 2 tablets by mouth at bedtime as needed. For sleeplessness      .  fenofibrate (TRICOR) 145 MG tablet Take 134 mg by mouth every morning.       . furosemide (LASIX) 40 MG tablet Take 20 mg by mouth every morning.       . Glucosamine-Chondroit-Vit C-Mn (GLUCOSAMINE 1500 COMPLEX PO) Take 750 mg by mouth daily.      . methocarbamol (ROBAXIN) 500 MG tablet Take 500 mg by mouth every 8 (eight) hours as needed. For muscle spasms      . metoprolol tartrate (LOPRESSOR) 25 MG tablet Take 25 mg by mouth 2 (two) times  daily.      . Multiple Vitamin (MULTIVITAMIN) tablet Take 1 tablet by mouth daily.      Marland Kitchen omega-3 acid ethyl esters (LOVAZA) 1 G capsule Take 2 g by mouth daily.      . ondansetron (ZOFRAN) 8 MG tablet Take 1 tablet (8 mg total) by mouth every 8 (eight) hours as needed.  30 tablet  3  . oxyCODONE-acetaminophen (PERCOCET) 5-325 MG per tablet Take 1 tablet by mouth every 4 (four) hours as needed. For pain      . potassium chloride SA (K-DUR,KLOR-CON) 20 MEQ tablet Take 1 tablet (20 mEq total) by mouth every other day.  15 tablet  1  . sennosides-docusate sodium (SENOKOT-S) 8.6-50 MG tablet Take 1 tablet by mouth daily as needed. For constipation      . simvastatin (ZOCOR) 80 MG tablet Take 80 mg by mouth at bedtime.       Marland Kitchen tiotropium (SPIRIVA) 18 MCG inhalation capsule Place 18 mcg into inhaler and inhale daily.        Marland Kitchen warfarin (COUMADIN) 3 MG tablet Take by mouth daily. Take 1/2 pill Sat and Sun, take whole pill weekdays       No current facility-administered medications for this visit.   Facility-Administered Medications Ordered in Other Visits  Medication Dose Route Frequency Provider Last Rate Last Dose  . etoposide (VEPESID) 190 mg in sodium chloride 0.9 % 500 mL chemo infusion  90 mg/m2 (Treatment Plan Actual) Intravenous Once Samul Dada, MD 510 mL/hr at 07/30/12 1432 190 mg at 07/30/12 1432   IMMUNIZATIONS:  1. Pneumovax was given in October 2012.  2. Flu shot was administered in October 2013.    SMOKING HISTORY: The patient smoked up to a pack and a half of cigarettes a  day for over 50 years. She stopped smoking in June of 2005.    HISTORY:  I saw [name] today for followup of her recurrent cancers involving the lung as outlined above.  [name] is currently undergoing chemotherapy for the lingular nodule which showed primarily invasive, poorly differentiated carcinoma which showed predominantly, invasive, small-cell poorly differentiated carcinoma with a minor  squamous cell component.  Biopsy was carried out on 04/01/2012.  Following that, the patient underwent stereotactic radiation treatments.  It was decided to treat the patient with 4 cycles of chemotherapy utilizing carboplatin and VP-16 along with Neulasta.  The patient has completed 3 cycles of chemotherapy.  She was last seen here on 07/07/2012.  She is here today for the start of cycle #4.  She is accompanied by her daughter, Mariana Kaufman.  The patient has tolerated her treatments extremely well, without any significant side effects other than some mild nausea which occurred on day 3.  The Power PICC line is being flushed with heparin once weekly. The [facility] that.  Her back pain seems to be somewhat improved.  She  takes an OxyContin every 4 or 5 days. Respiratory status is stable.  The patient has had no further problems with arrhythmias.  PHYSICAL EXAMINATION:  The patient is alert and in no acute distress. She is in a wheelchair.  Weight is 214 pounds 12.8 ounces, height 5 feet 4-1/2 inches body surface area 2.11 meters squared.  Blood pressure 112/49.  Other vital signs are normal.  There was no scleral icterus. Mouth and pharynx are benign.  There was no peripheral adenopathy palpable.  Heart and lungs were normal.  Rhythm was regular.  I did not hear a systolic ejection murmur.  Abdomen with the patient sitting is benign.  Extremities a little puffy without actual pitting edema or clubbing.  The patient does have a 2 lumen PICC line in the right upper extremity.  This is heavily bandaged.  Neurologic exam was normal.  LABORATORY DATA:  White count 10.1.  ANC 6.4, hemoglobin 12.4, hematocrit 37.0, platelets 207,000.  Chemistries today are pending. Chemistries from 07/07/2012 notable for a BUN of 33, creatinine 1.3, otherwise normal.  IMAGING STUDIES:  1. PET scan from 09/07/2010 showed hypermetabolic right upper lobe  pulmonary nodule, most  consistent with bronchogenic carcinoma. SUV  max was 7.4. There was hypermetabolic activity associated with the  left adrenal gland nodule. This was favored to be a lipid poor  adenoma or hyperplasia.  2. MRI of the head with and without IV contrast on 10/04/2010 showed  no acute or metastatic intracranial abnormality. There was stable  MRI appearance of the brain as compared with a prior study of  11/02/2005. There were acute on chronic sphenoid sinus  inflammatory changes.  3. CT scan of the chest without IV contrast on 01/30/2011 showed  interval reduction in the size of the right lobe pulmonary nodule  without evidence of local recurrence. No evidence for mediastinal  metastatic disease. Left adrenal adenoma was noted. CT scan of  abdomen and pelvis with IV contrast on 02/22/2011 showed no acute  findings. Hepatic steatosis was present. The patient has had  previous hysterectomy. Both adnexa were unremarkable. No masses  or lymphadenopathy noted within the abdomen or pelvis.  4. CT angiogram of the chest on 06/30/2011 showed no evidence for  pulmonary embolism. The patient with status post right lower  lobectomy with no evidence of recurrent or metastatic disease in  the chest.  5. CT scan of the chest without IV contrast on 08/02/2011 showed a  lingular nodule, small in size measuring 4 mm, more prominent than  on the prior study of 01/30/2011 in retrospect. A malignant lesion  was favored. There was focal air space consolidation in the  superior segment of the left lower lobe felt to be new compared  with the study of 06/30/2011, most consistent with infectious or  inflammatory etiology. There were postoperative changes consistent  with a right lower lobectomy. There was extensive atherosclerotic  calcification of the arterial vasculature and pulmonary arterial  hypertension.  6. Bone scan carried out on 08/20/2011 showed no evidence for bone  metastases. There was increased  radio tracer uptake within the  posterior aspect of the lumbar spine consistent with chronic post-  surgical change.  7. CT scan of the chest without IV contrast on 10/31/2011 showed  continued interval enlargement of a now 5 mm nodule in the lingula  seen on image 27 of series 5 suspicious for a small malignant  nodule. Continued followup was suggested. It was felt that this  lesion was  below the resolution of a PET scan and too small to  biopsy. The patient was noted to be status post right lower  lobectomy. There was also extensive atherosclerosis involving the  thoracic aorta, great vessels in the mediastinum and the coronary  arteries including calcified atherosclerotic plaque in the left  main, left anterior descending, left circumflex, and right coronary  arteries. There was also extensive calcification of the aortic  valve and mitral annulus. There was a small hiatal hernia. There  were no pathologically enlarged mediastinal or hilar lymph nodes.  8. CT scan of chest without IV contrast on 02/14/2012 showed enlargement of  the lingular nodule consistent with neoplasm. There was pulmonary  artery enlargement suggesting pulmonary arterial hypertension. Hepatic  steatosis and probable hepatomegaly were noted.  9. PET scan on 03/14/2012 showed a 9 x 13 mm lingular nodule with maximum  SUV of 3.9, suspicious for primary bronchogenic neoplasm. A  metachronous primary was favored. There was no evidence for metastatic  disease. The patient was status post right lower lobectomy.  10. CT-guided core needle biopsy of the left lung lesion was carried  out on 04/01/2012.  11. Chest x-ray, 1 view, from 04/01/2012 showed the nodule in the  lingula surrounded by a halo of airspace disease. There was no  pulmonary edema or pneumothorax. There was stable focal sclerosis  in the proximal left humerus.  12. Chest x-ray, 2 view, from 05/13/2012 showed no acute cardiopulmonary  disease. There were  stable post surgical changes and volume loss of the  right lung as well as the known nodule over the lingula. 13. PICC placement with ultrasound and fluoroscopic guidance carried out on 07/07/2012.  There was successful placement of a right upper extremity brachial vein approach, dual-lumen Power PICC with sonographic and fluoroscopic guidance.   IMPRESSION/PLAN\:  [name] is doing well and is about to start her 4th and final chemotherapy treatment with carboplatin and VP-16. Carboplatin dose today is 350 mg.  VP-16 dose 190 mg daily for 3 days. The PICC catheter will be pulled on day 3 which will be Thursday February 20th.  The patient will return on Friday February 21st for Neulasta 6 mg subcu.  We have not been checking interim blood counts since the first cycle of treatment.  We will have [name] return in about 6 weeks which will be April 1st at which time we will check CBC and chemistries.  One week prior to that appointment we will obtain a CT scan of the chest with IV contrast.    ______________________________ Samul Dada, M.D. DSM/MEDQ  D:  07/29/2012  T:  07/30/2012  Job:  [id number]

## 2012-07-31 ENCOUNTER — Ambulatory Visit: Payer: Medicare Other

## 2012-07-31 ENCOUNTER — Ambulatory Visit (HOSPITAL_BASED_OUTPATIENT_CLINIC_OR_DEPARTMENT_OTHER): Payer: Medicare Other

## 2012-07-31 VITALS — BP 117/58 | HR 89 | Temp 97.7°F | Resp 20

## 2012-07-31 DIAGNOSIS — Z5111 Encounter for antineoplastic chemotherapy: Secondary | ICD-10-CM

## 2012-07-31 MED ORDER — PROCHLORPERAZINE EDISYLATE 5 MG/ML IJ SOLN
10.0000 mg | Freq: Once | INTRAMUSCULAR | Status: AC
  Administered 2012-07-31: 10 mg via INTRAVENOUS

## 2012-07-31 MED ORDER — PROCHLORPERAZINE MALEATE 10 MG PO TABS: 10.0000 mg | ORAL_TABLET | Freq: Once | ORAL | Status: DC

## 2012-07-31 MED ORDER — SODIUM CHLORIDE 0.9 % IJ SOLN
10.0000 mL | INTRAMUSCULAR | Status: DC | PRN
  Filled 2012-07-31: qty 10

## 2012-07-31 MED ORDER — SODIUM CHLORIDE 0.9 % IV SOLN
90.0000 mg/m2 | Freq: Once | INTRAVENOUS | Status: AC
  Administered 2012-07-31: 190 mg via INTRAVENOUS
  Filled 2012-07-31: qty 9.5

## 2012-07-31 MED ORDER — HEPARIN SOD (PORK) LOCK FLUSH 100 UNIT/ML IV SOLN
250.0000 [IU] | Freq: Once | INTRAVENOUS | Status: DC | PRN
  Filled 2012-07-31: qty 5

## 2012-07-31 MED ORDER — SODIUM CHLORIDE 0.9 % IV SOLN
Freq: Once | INTRAVENOUS | Status: AC
  Administered 2012-07-31: 13:00:00 via INTRAVENOUS

## 2012-07-31 NOTE — Patient Instructions (Addendum)
Peripherally Inserted Central Catheter (PICC) Removal and Care After A peripherally inserted catheter (PICC) is removed when it is no longer needed, when it is clotted, or when it may be infected.  PROCEDURE  The removal of a PICC is usually painless. Removing the tape that holds the PICC in place may be the most discomfort you have.  A physicians order needs to be obtained to have the PICC removed.  A PICC can be removed in the hospital or in an outpatient setting.  Never remove or take out the PICC yourself. Only a trained clinical professional, such as a PICC nurse, should remove the PICC.  If a PICC is suspected to be infected, the PICC tip is sent to the lab for culture. HOME CARE INSTRUCTIONS  When the PICC is out, pressure is applied at the insertion site to prevent bleeding. An antibiotic ointment may be applied to the insertion site. A dry, sterile gauze is then taped over the insertion site. This dressing should stay on for 24 hours.  After the 24 hours is up, the dressing may be removed. The PICC insertion site is very small. A small scab may develop over the insertion site. It is okay to wash the site gently with soap and water. Be careful to not remove or pick the scab off. After washing, gently pat the site dry. You do not need to put another dressing over the insertion site after you wash it.  Avoid heavy, strenuous physical activity for 24 hours after the PICC is removed. This includes things like:  Weight lifting.  Strenuous yard work.  Any physical activity with repetitive arm movement. SEEK MEDICAL CARE IF:  Call or see your caregiver as soon as possible if you develop the following conditions in the arm in which the PICC was inserted:  Swelling or puffiness.  Increasing tenderness or pain. SEEK IMMEDIATE MEDICAL CARE IF:  You develop any of the following conditions in the arm that had the PICC:  Numbness or tingling in your fingers, hand, or arm.  You arm has  a bluish color and it is cold to the touch.  Redness around the insertion site or a red-streak that goes up your arm.  Any type of drainage from the PICC insertion site. This includes drainage such as:  Bleeding from the insertion site. (If this happens, apply firm, direct pressure to the PICC insertion site with a clean towel.)  Drainage that is yellow or tan in color.  You have an oral temperature above 102 F (38.9 C), not controlled by medicine. Document Released: 11/15/2009 Document Revised: 08/20/2011 Document Reviewed: 11/15/2009 [employer] Patient Information 2013 [employer].  

## 2012-07-31 NOTE — Progress Notes (Signed)
Right PICC line removed. Line noted to be 39 cm long. Gauze pressure dressing applied. Patient tolerated well with no complaints. Patient placed flat on her back for one hour of observation.

## 2012-07-31 NOTE — Progress Notes (Signed)
picc line pulled by catherine miller rn.  Pt supine 1hr after.  Denies any disconformt dmr

## 2012-08-01 ENCOUNTER — Ambulatory Visit (HOSPITAL_BASED_OUTPATIENT_CLINIC_OR_DEPARTMENT_OTHER): Payer: Medicare Other

## 2012-08-01 VITALS — BP 90/62 | HR 84 | Temp 98.3°F

## 2012-08-01 MED ORDER — PEGFILGRASTIM INJECTION 6 MG/0.6ML
6.0000 mg | Freq: Once | SUBCUTANEOUS | Status: AC
  Administered 2012-08-01: 6 mg via SUBCUTANEOUS
  Filled 2012-08-01: qty 0.6

## 2012-09-02 ENCOUNTER — Other Ambulatory Visit (HOSPITAL_BASED_OUTPATIENT_CLINIC_OR_DEPARTMENT_OTHER): Payer: Medicare Other | Admitting: Lab

## 2012-09-02 ENCOUNTER — Ambulatory Visit (HOSPITAL_COMMUNITY)
Admission: RE | Admit: 2012-09-02 | Discharge: 2012-09-02 | Disposition: A | Discharge status: Home / Self Care | Payer: Medicare Other | Source: Ambulatory Visit | Attending: Oncology | Admitting: Oncology

## 2012-09-02 ENCOUNTER — Encounter (HOSPITAL_COMMUNITY): Payer: Self-pay

## 2012-09-02 DIAGNOSIS — F17200 Nicotine dependence, unspecified, uncomplicated: Secondary | ICD-10-CM | POA: Insufficient documentation

## 2012-09-02 DIAGNOSIS — C3490 Malignant neoplasm of unspecified part of unspecified bronchus or lung: Secondary | ICD-10-CM

## 2012-09-02 DIAGNOSIS — I700 Atherosclerosis of aorta: Secondary | ICD-10-CM | POA: Insufficient documentation

## 2012-09-02 DIAGNOSIS — I2510 Atherosclerotic heart disease of native coronary artery without angina pectoris: Secondary | ICD-10-CM | POA: Insufficient documentation

## 2012-09-02 DIAGNOSIS — I714 Abdominal aortic aneurysm, without rupture, unspecified: Secondary | ICD-10-CM | POA: Insufficient documentation

## 2012-09-02 DIAGNOSIS — Z9221 Personal history of antineoplastic chemotherapy: Secondary | ICD-10-CM | POA: Insufficient documentation

## 2012-09-02 DIAGNOSIS — Z923 Personal history of irradiation: Secondary | ICD-10-CM | POA: Insufficient documentation

## 2012-09-02 DIAGNOSIS — Z902 Acquired absence of lung [part of]: Secondary | ICD-10-CM | POA: Insufficient documentation

## 2012-09-02 DIAGNOSIS — I288 Other diseases of pulmonary vessels: Secondary | ICD-10-CM | POA: Insufficient documentation

## 2012-09-02 LAB — COMPREHENSIVE METABOLIC PANEL (CC13)
AST: 22 U/L (ref 5–34)
Alkaline Phosphatase: 43 U/L (ref 40–150)
BUN: 34.8 mg/dL — ABNORMAL HIGH (ref 7.0–26.0)
Calcium: 10.3 mg/dL (ref 8.4–10.4)
Chloride: 101 mEq/L (ref 98–107)
Creatinine: 1.5 mg/dL — ABNORMAL HIGH (ref 0.6–1.1)

## 2012-09-02 LAB — CBC WITH DIFFERENTIAL/PLATELET
BASO%: 0.2 % (ref 0.0–2.0)
EOS%: 2.4 % (ref 0.0–7.0)
HCT: 39.9 % (ref 34.8–46.6)
LYMPH%: 19 % (ref 14.0–49.7)
MCH: 33.6 pg (ref 25.1–34.0)
MCHC: 33.3 g/dL (ref 31.5–36.0)
MCV: 101 fL (ref 79.5–101.0)
MONO%: 7.5 % (ref 0.0–14.0)
NEUT%: 70.9 % (ref 38.4–76.8)
Platelets: 253 10*3/uL (ref 145–400)

## 2012-09-02 MED ORDER — IOHEXOL 300 MG/ML  SOLN
80.0000 mL | Freq: Once | INTRAMUSCULAR | Status: AC | PRN
  Administered 2012-09-02: 80 mL via INTRAVENOUS

## 2012-09-11 ENCOUNTER — Encounter: Payer: Self-pay | Admitting: *Deleted

## 2012-09-11 ENCOUNTER — Encounter: Payer: Self-pay | Admitting: Oncology

## 2012-09-11 ENCOUNTER — Ambulatory Visit (HOSPITAL_BASED_OUTPATIENT_CLINIC_OR_DEPARTMENT_OTHER): Payer: Medicare Other | Admitting: Oncology

## 2012-09-11 ENCOUNTER — Encounter: Payer: Self-pay | Admitting: Radiation Oncology

## 2012-09-11 ENCOUNTER — Telehealth: Payer: Self-pay | Admitting: Oncology

## 2012-09-11 ENCOUNTER — Ambulatory Visit
Admission: RE | Admit: 2012-09-11 | Discharge: 2012-09-11 | Disposition: A | Discharge status: Home / Self Care | Payer: Medicare Other | Source: Ambulatory Visit | Attending: Radiation Oncology | Admitting: Radiation Oncology

## 2012-09-11 DIAGNOSIS — C3490 Malignant neoplasm of unspecified part of unspecified bronchus or lung: Secondary | ICD-10-CM

## 2012-09-11 NOTE — Progress Notes (Signed)
This office note has been dictated.  #[id number]

## 2012-09-11 NOTE — Patient Instructions (Signed)
Your oxygen saturation today was 87% which is low.    You need to use your home oxygen on a continuous basis.  Please make an appointment to see Dr. Marcelyn Bruins.

## 2012-09-11 NOTE — Progress Notes (Signed)
Follow up appt s/p rad txs 04/22/12-04/24/12- 04/29/12 lung Alert,oriented x3, in w/c, just came from Dr,murinson office, patient sob , stated she hasn't eaten all day, no nausea, , coughs up white pheglm occasionally, fatigued but better, , offered gingerale, declined [employer], stated " I'm going to [employer] when we leave here., appetite good, pain hand from arthritis, and start of head ache, took her own tylenol as we were speaking, dizzyness at times, uses a cane, lives in a single wide mobile home, holds on to bed and wall going to the bathroom, uses her cane when she walks to living room, no falls, has stunbled stated patient 2:16 PM

## 2012-09-11 NOTE — Telephone Encounter (Signed)
gv and printed appt schedule for pt for July...pt aware to go to radiology b4 lab

## 2012-09-11 NOTE — Progress Notes (Signed)
Radiation Oncology         [phone]) [phone] ________________________________  Name: [name] MRN: [id number]  Date: 09/11/2012  DOB: 1930/12/13  Follow-Up Visit Note  CC: [name], NP  [name], MD  Diagnosis:   79 yo woman with multiple metachronous lung cancers s/p 1.  Curative SBRT for clincal IA NSCLC of the right upper lobe treated to 50 Gy in 5 fractions in May 2012  2.  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  She is without any new complaints. She reports some modest dyspnea with exertion and occasional cough. Her cough is typically dry. When she does produce any sputum it appears to be white.                              ALLERGIES:  is allergic to aspirin.  Meds: Current Outpatient Prescriptions  Medication Sig Dispense Refill  . acetaminophen (TYLENOL) 650 MG CR tablet Take 650 mg by mouth every 8 (eight) hours as needed. For pain      . alendronate (FOSAMAX) 70 MG tablet Take 70 mg by mouth every 7 (seven) days. Take with a full glass of water on an empty stomach. On Tuesdays      . amLODipine (NORVASC) 5 MG tablet Take 5 mg by mouth daily.      . benazepril (LOTENSIN) 10 MG tablet Take 10 mg by mouth daily.      . cimetidine (TAGAMET) 200 MG tablet Take 200 mg by mouth daily.        . diphenhydramine-acetaminophen (TYLENOL PM) 25-500 MG TABS Take 2 tablets by mouth at bedtime as needed. For sleeplessness      . fenofibrate (TRICOR) 145 MG tablet Take 145 mg by mouth every morning.       . furosemide (LASIX) 40 MG tablet Take 20 mg by mouth every morning.       . Glucosamine-Chondroit-Vit C-Mn (GLUCOSAMINE 1500 COMPLEX PO) Take 750 mg by mouth daily.      . methocarbamol (ROBAXIN) 500 MG tablet Take 500 mg by mouth every 8 (eight)  hours as needed. For muscle spasms      . metoprolol tartrate (LOPRESSOR) 25 MG tablet Take 25 mg by mouth 2 (two) times daily.      . Multiple Vitamin (MULTIVITAMIN) tablet Take 1 tablet by mouth daily.      Marland Kitchen omega-3 acid ethyl esters (LOVAZA) 1 G capsule Take 2 g by mouth daily.      Marland Kitchen oxyCODONE-acetaminophen (PERCOCET) 5-325 MG per tablet Take 1 tablet by mouth every 4 (four) hours as needed. For pain      . potassium chloride SA (K-DUR,KLOR-CON) 20 MEQ tablet Take 1 tablet (20 mEq total) by mouth every other day.  15 tablet  1  . sennosides-docusate sodium (SENOKOT-S) 8.6-50 MG tablet Take 1 tablet by mouth daily as needed. For constipation      . simvastatin (ZOCOR) 80 MG tablet Take 80 mg by mouth at bedtime.       Marland Kitchen tiotropium (SPIRIVA) 18 MCG inhalation capsule Place 18 mcg into inhaler and inhale daily.        Marland Kitchen warfarin (COUMADIN) 3 MG tablet Take by mouth  daily. Take 1/2 pill Sat and Sun, take whole pill weekdays       No current facility-administered medications for this encounter.    Physical Findings: The patient is in no acute distress. Patient is alert and oriented.  oral temperature is 98.2 F (36.8 C). Her blood pressure is 118/57 and her pulse is 66. Her respiration is 24 and oxygen saturation is 96%. Marland Kitchen Heart sounds are distant but regular. Lungs are clear to auscultation throughout.  No significant changes.  Radiographic Findings: Ct Chest W Contrast  09/02/2012  *RADIOLOGY REPORT*  Clinical Data: Right-sided lung cancer diagnosed in 2006 status post resection, chemotherapy and radiation therapy.  Evaluate for recurrence.  CT CHEST WITH CONTRAST  Technique:  Multidetector CT imaging of the chest was performed following the standard protocol during bolus administration of intravenous contrast.  Contrast: 80mL OMNIPAQUE IOHEXOL 300 MG/ML  SOLN  Comparison: Chest CT 02/14/2012.  PET CT 03/14/2012.  Findings:  Mediastinum: Heart size is normal. There is no significant  pericardial fluid, thickening or pericardial calcification. There is atherosclerosis of the thoracic aorta, the great vessels of the mediastinum and the coronary arteries, including calcified atherosclerotic plaque in the left main, left anterior descending, left circumflex and right coronary arteries. No pathologically enlarged mediastinal or hilar lymph nodes.   Esophagus is unremarkable in appearance.    Mild dilatation of the pulmonic trunk (3.7 cm in diameter), suggestive of pulmonary arterial hypertension.  Lungs/Pleura: Previously noted left upper lobe nodule is no longer clearly identified (image 29 of series 5 shows the location of the previously noted nodule).  There is now some subtle septal thickening in this region, likely represents evolving postradiation changes.  Linear opacity in the inferior segment lingula is most compatible with evolving scarring.  No suspicious appearing pulmonary nodules or masses identified on today's examination. Status post right lower lobectomy.  Compensatory hyperexpansion of the right middle and upper lobes.  A background of moderate centrilobular emphysema.  Chronic areas of peripheral reticulation and architectural distortion in the right upper lobe are similar to prior examinations and may be related to prior treatment.  Upper Abdomen: Mild nodularity of the left adrenal gland is unchanged compared to the prior examination, favored to be benign. Extensive atherosclerosis including focal fusiform aneurysmal dilatation of the abdominal aorta at and immediately below the level of the renal arteries measuring up to 3.1 x 2.7 cm.  Musculoskeletal: There are no aggressive appearing lytic or blastic lesions noted in the visualized portions of the skeleton.  IMPRESSION: 1.  Today's study demonstrates a positive response to therapy with resolution of the previously noted lingular nodule. 2.  No new suspicious-appearing pulmonary nodules or masses are noted. 3.  No mediastinal  or hilar lymphadenopathy. 4.  Dilatation of the pulmonic trunk again suggest pulmonary arterial hypertension. 5.  Extensive atherosclerosis, including left main and three vessel coronary artery disease as well as mild fusiform aneurysmal dilatation of the abdominal aorta, as above. 6.  Additional incidental findings, as above, similar to prior studies.   Original Report Authenticated By: Trudie Reed, M.D.    Impression:  The patient currently exhibits local control status post SBRT.  Plan:  The patient will return to radiation oncology clinic for routine followup in 6 months. She is continuing to follow with Dr. Arline Asp as well.  _____________________________________  Artist Pais. Kathrynn Running, M.D.

## 2012-09-12 ENCOUNTER — Other Ambulatory Visit: Payer: Self-pay | Admitting: Medical Oncology

## 2012-09-12 DIAGNOSIS — J449 Chronic obstructive pulmonary disease, unspecified: Secondary | ICD-10-CM

## 2012-09-12 NOTE — Progress Notes (Signed)
CC:   Oneita Hurt, M.D. [name], NP Foye Deer, MD  PROBLEM LIST:  1. High-grade neuroendocrine lung cancer, small cell, with areas of non-  small cell lung cancer, right lower lobe, with biopsy carried out on  04/16/2005, treated successfully with carboplatin and VP16 from 05/07/2005 through 07/09/2005 and then right lower lobe lobectomy on  08/09/2005.  2. Squamous cell carcinoma of the right lower lobe T1 resected at the  time of surgery on 08/09/2005. All resected lymph nodes were  negative.  3. Non small cell lung cancer of the right upper lobe detected in March  2012, +NABx 09/21/10, with positive PET scan treated with stereotactic  radiation 5000 cGy in 5 fractions from 10/17/2010 through 10/27/2010.  4. Lingular nodule which appears to be increasing in size. This  lesion may have been present on a CT scan of the chest going back  to 08/30/2010. A PET scan carried out on 03/14/2012 showed no evidence of metastatic disease. Core needle biopsy of the lingular nodule was carried out on 04/01/2012 and showed invasive poorly differentiated carcinoma with predominant small cell carcinoma component and minor squamous cell component. Stains for p63, chromogranin and synaptophysin were diffusely positive. Cytokeratin 5/6 was positive in the squamous cell carcinoma component, but negative in the rest of the tumor. The small cell component comprised about 90%  of the tumor. Slides were reviewed with Dr. Hollice Espy. The lesion was  treated with stereotactic body radiotherapy (SBRT), 18 Gy per session, for  a total of 3 sessions, administered on 04/22/2012, 04/24/2012, and 04/29/2012, for a total of 54 Gy. The patient received 4 cycles of carboplatin and VP-16 with Neulasta from 05/19/2012 through 07/31/2012. 5. 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  6. Anticoagulation therapy with Coumadin.  7. History of  diastolic congestive heart failure October 2012.  8. History of colitis and rectal bleeding most recently September  2012.  9. Degenerative disease of the spine status post decompression at L4-  S1 in May 2006.  10. Hypertension.  11. Peripheral vascular disease.  12. Dyslipidemia.  13. History of colonic polyps with high-grade dysplasia noted in a  tubular adenoma.  14. Hearing impairment requiring bilateral hearing aids since 2010.  15. History of a left frontal lobe lesion, possibly a meningioma, seen  on MRI of the head in November 2006 and May 2007.  16. Systolic ejection murmur.  17. Chronic obstructive pulmonary disease.  18. Power PICC line was inserted in the right upper extremity on  07/07/2012 by Interventional Radiology. The PICC line was used for  cycles 3 and 4 and was removed after the final day of  chemotherapy on 07/31/2012.   MEDICATIONS:  Reviewed and recorded. Current Outpatient Prescriptions  Medication Sig Dispense Refill  . acetaminophen (TYLENOL) 650 MG CR tablet Take 650 mg by mouth every 8 (eight) hours as needed. For pain      . alendronate (FOSAMAX) 70 MG tablet Take 70 mg by mouth every 7 (seven) days. Take with a full glass of water on an empty stomach. On Tuesdays      . amLODipine (NORVASC) 5 MG tablet Take 5 mg by mouth daily.      . benazepril (LOTENSIN) 10 MG tablet Take 10 mg by mouth daily.      . cimetidine (TAGAMET) 200 MG tablet Take 200 mg by mouth daily.        . diphenhydramine-acetaminophen (TYLENOL PM)  25-500 MG TABS Take 2 tablets by mouth at bedtime as needed. For sleeplessness      . fenofibrate (TRICOR) 145 MG tablet Take 145 mg by mouth every morning.       . furosemide (LASIX) 40 MG tablet Take 20 mg by mouth every morning.       . Glucosamine-Chondroit-Vit C-Mn (GLUCOSAMINE 1500 COMPLEX PO) Take 750 mg by mouth daily.      . methocarbamol (ROBAXIN) 500 MG tablet Take 500 mg by mouth every 8 (eight) hours as needed. For muscle spasms       . metoprolol tartrate (LOPRESSOR) 25 MG tablet Take 25 mg by mouth 2 (two) times daily.      . Multiple Vitamin (MULTIVITAMIN) tablet Take 1 tablet by mouth daily.      Marland Kitchen omega-3 acid ethyl esters (LOVAZA) 1 G capsule Take 2 g by mouth daily.      Marland Kitchen oxyCODONE-acetaminophen (PERCOCET) 5-325 MG per tablet Take 1 tablet by mouth every 4 (four) hours as needed. For pain      . potassium chloride SA (K-DUR,KLOR-CON) 20 MEQ tablet Take 1 tablet (20 mEq total) by mouth every other day.  15 tablet  1  . sennosides-docusate sodium (SENOKOT-S) 8.6-50 MG tablet Take 1 tablet by mouth daily as needed. For constipation      . simvastatin (ZOCOR) 80 MG tablet Take 80 mg by mouth at bedtime.       Marland Kitchen tiotropium (SPIRIVA) 18 MCG inhalation capsule Place 18 mcg into inhaler and inhale daily.        Marland Kitchen warfarin (COUMADIN) 3 MG tablet Take by mouth daily. Take 1/2 pill Sat and Sun, take whole pill weekdays       No current facility-administered medications for this visit.   IMMUNIZATIONS:  1. Pneumovax was given in October 2012.  2. Flu shot was administered in October 2013.    SMOKING HISTORY: The patient smoked up to a pack and a half of cigarettes a  day for over 50 years. She stopped smoking in June of 2005.   HISTORY:  [name] was seen today for followup of her recurrent cancers involving the lung as outlined above.  Mrs. Simerly was last seen by [place] on 07/29/2012 at which time she received her 4th and final treatment for the cancer involving the lingula which was mostly comprised of small-cell carcinoma with some smaller amount of a squamous cell carcinoma mixed in.  As noted the patient did receive stereotactic radiation treatments given in 3 sessions in mid November 2013 and then received 4 cycles of chemotherapy with carboplatin and VP-16 in combination with Neulasta for 4 cycles from early December 2013 through mid February 2014.  The patient tolerated her chemotherapy well.  She has  undergone a CT scan of the chest with IV contrast carried out on 09/02/2012.  The lingular nodule apparently has resolved.  There was some evidence of a pulmonary artery hypertension and extensive atherosclerosis.  The patient is here today with her daughter, Mariana Kaufman.  The patient is in a wheelchair.  She apparently has oxygen at home which she uses sparingly.  There were no new problems or complaints over the past 6 weeks.  The patient does have chronic back pain and is quite limited. She is here today in a wheelchair.  PHYSICAL EXAMINATION:  On physical exam there is little change.  Weight today is 212 pounds 12.8 ounces.  Weight is stable.  Height 5 feet 4-1/2 inches.  Body surface area 2.1 m2.  Blood pressure today 104/66.  Other vital signs are normal.  O2 saturation on the other hand, on room air was 87%.  The patient was told that she needs to use her oxygen on a continuous basis.  She was also urged to see Dr. Marcelyn Bruins.  I believe the patient was due to see him at some point in the past. HEENT:  There was scalp alopecia.  No scleral icterus.  Mouth and pharynx were benign.  No peripheral adenopathy palpable.  Heart and lungs:  Normal.  Rhythm was regular.  I did not appreciate a systolic ejection murmur.  Abdomen:  With the patient sitting is benign. Extremities:  No pitting edema.  The PICC line that was present 6 weeks ago has been removed following chemotherapy.  Neurologic:  Exam is grossly normal.  LABORATORY DATA:  From 09/02/2012 revealed normal CBC with a hemoglobin 13.3, hematocrit 39.9, white count 9.5, and platelets 253,000. Chemistries notable for a BUN 35, creatinine 1.5 and a glucose of 169. Albumin was 3.7 and LDH 144.  IMAGING STUDIES:  1. PET scan from 09/07/2010 showed hypermetabolic right upper lobe  pulmonary nodule, most consistent with bronchogenic carcinoma. SUV  max was 7.4. There was hypermetabolic activity associated with the  left  adrenal gland nodule. This was favored to be a lipid poor  adenoma or hyperplasia.  2. MRI of the head with and without IV contrast on 10/04/2010 showed  no acute or metastatic intracranial abnormality. There was stable  MRI appearance of the brain as compared with a prior study of  11/02/2005. There were acute on chronic sphenoid sinus  inflammatory changes.  3. CT scan of the chest without IV contrast on 01/30/2011 showed  interval reduction in the size of the right lobe pulmonary nodule  without evidence of local recurrence. No evidence for mediastinal  metastatic disease. Left adrenal adenoma was noted. CT scan of  abdomen and pelvis with IV contrast on 02/22/2011 showed no acute  findings. Hepatic steatosis was present. The patient has had  previous hysterectomy. Both adnexa were unremarkable. No masses  or lymphadenopathy noted within the abdomen or pelvis.  4. CT angiogram of the chest on 06/30/2011 showed no evidence for  pulmonary embolism. The patient with status post right lower  lobectomy with no evidence of recurrent or metastatic disease in  the chest.  5. CT scan of the chest without IV contrast on 08/02/2011 showed a  lingular nodule, small in size measuring 4 mm, more prominent than  on the prior study of 01/30/2011 in retrospect. A malignant lesion  was favored. There was focal air space consolidation in the  superior segment of the left lower lobe felt to be new compared  with the study of 06/30/2011, most consistent with infectious or  inflammatory etiology. There were postoperative changes consistent  with a right lower lobectomy. There was extensive atherosclerotic  calcification of the arterial vasculature and pulmonary arterial  hypertension.  6. Bone scan carried out on 08/20/2011 showed no evidence for bone  metastases. There was increased radio tracer uptake within the  posterior aspect of the lumbar spine consistent with chronic post-  surgical change.  7.  CT scan of the chest without IV contrast on 10/31/2011 showed  continued interval enlargement of a now 5 mm nodule in the lingula  seen on image 27 of series 5 suspicious for a small malignant  nodule. Continued followup was suggested. It was felt that this  lesion was below the resolution of a PET scan and too small to  biopsy. The patient was noted to be status post right lower  lobectomy. There was also extensive atherosclerosis involving the  thoracic aorta, great vessels in the mediastinum and the coronary  arteries including calcified atherosclerotic plaque in the left  main, left anterior descending, left circumflex, and right coronary  arteries. There was also extensive calcification of the aortic  valve and mitral annulus. There was a small hiatal hernia. There  were no pathologically enlarged mediastinal or hilar lymph nodes.  8. CT scan of chest without IV contrast on 02/14/2012 showed enlargement of  the lingular nodule consistent with neoplasm. There was pulmonary  artery enlargement suggesting pulmonary arterial hypertension. Hepatic  steatosis and probable hepatomegaly were noted.  9. PET scan on 03/14/2012 showed a 9 x 13 mm lingular nodule with maximum  SUV of 3.9, suspicious for primary bronchogenic neoplasm. A  metachronous primary was favored. There was no evidence for metastatic  disease. The patient was status post right lower lobectomy.  10. CT-guided core needle biopsy of the left lung lesion was carried  out on 04/01/2012.  11. Chest x-ray, 1 view, from 04/01/2012 showed the nodule in the  lingula surrounded by a halo of airspace disease. There was no  pulmonary edema or pneumothorax. There was stable focal sclerosis  in the proximal left humerus.  12. Chest x-ray, 2 view, from 05/13/2012 showed no acute cardiopulmonary  disease. There were stable post surgical changes and volume loss of the  right lung as well as the known nodule over the lingula.  13. PICC  placement with ultrasound and fluoroscopic guidance carried out on  07/07/2012. There was successful placement of a right upper extremity  brachial vein approach, dual-lumen Power PICC with sonographic and  fluoroscopic guidance.  14. Chest CT with IV contrast from 09/02/2012 showed that the previously noted left upper lobe nodule was no longer clearly identified (image 29 of series 5 shows the location of the previously noted nodule).  No suspicious appearing pulmonary nodules or masses were identified.  The patient has had a right lower lobectomy.  There is a background of moderate centrilobular emphysema.  No mediastinal or hilar adenopathy.  There is dilatation of the pulmonary trunk suggesting pulmonary arterial hypertension.  There was extensive atherosclerosis including the left main and 3-vessel coronary artery disease as well as mild fusiform aneurysmal dilatation of the abdominal aorta.   IMPRESSION AND PLAN:  Mrs. Thomason seems to be doing well.  She has had an excellent response to the treatment for the cancer, mostly small cell that involved the left lingula.  Mrs. Demonte has an appointment to see Dr. Kathrynn Running today.  I have suggested that she may need to use oxygen on a continuous basis.  The patient's daughter does have an oximeter that she can use to monitor the patient's O2 saturations at home.  I have also recommended that an appointment be made with Dr. Marcelyn Bruins or one of the Pulmonary specialists for further evaluation and management of the patient's hypoxia and emphysema.  I have asked Mrs. Bordeau to return in mid July at which time we will check CBC and chemistries. We will also obtain a chest x-ray, PA and lateral at that time.    ______________________________ Samul Dada, M.D. DSM/MEDQ  D:  09/11/2012  T:  09/12/2012  Job:  [id number]

## 2012-11-17 ENCOUNTER — Telehealth: Payer: Self-pay | Admitting: Oncology

## 2012-11-17 NOTE — Telephone Encounter (Signed)
S/w pt's son re appt for 7/1 @ 2:30 pm to arrive 2:15pm w/Dr. Shelle Iron @ [facility]. S/w desk nurse on 6/6 checking to see if pt was still in need of this appt. Per desk nurse proceed w/appt.

## 2012-11-21 ENCOUNTER — Telehealth: Payer: Self-pay | Admitting: Oncology

## 2012-11-21 NOTE — Telephone Encounter (Signed)
Moved 7/17 appt to covering provider due to DM's departure. S/w pt re new time for lb/fu 7/17 @ 1pm.

## 2012-12-09 ENCOUNTER — Encounter: Payer: Self-pay | Admitting: Pulmonary Disease

## 2012-12-09 ENCOUNTER — Ambulatory Visit (INDEPENDENT_AMBULATORY_CARE_PROVIDER_SITE_OTHER): Payer: Medicare Other | Admitting: Pulmonary Disease

## 2012-12-09 VITALS — BP 102/60 | HR 68 | Temp 97.7°F | Ht 64.5 in | Wt 219.6 lb

## 2012-12-09 DIAGNOSIS — R0602 Shortness of breath: Secondary | ICD-10-CM

## 2012-12-09 NOTE — Assessment & Plan Note (Signed)
The patient has significant dyspnea on exertion that is certainly multifactorial.  She is clearly very weak and debilitated, is overweight, has a history of diastolic heart failure, has a history of pulmonary embolus with pulmonary hypertension noted on prior echo, has had lung resection as well as radiation therapy twice, and finally may have significant COPD.  I have told her that there are things that we can try and improve, there were things that she can improve such as her weight and condition, and finally there are some things that no one can improve.  Our goal at this time is to try and improve her quality of life.  I would like to schedule her for pulmonary function studies to get some idea how much COPD she may really have.  We can intensify her bronchodilator regimen depending upon the results.  I would also like to check her oxygen level with ambulation today, as well as overnight oximetry.

## 2012-12-09 NOTE — Patient Instructions (Addendum)
Will schedule for breathing tests in the next few weeks, and would like to see you back the same day for review. Will check your oxygen level overnight to see if you need to wear oxygen. Work on weight loss and conditioning. Stay on oxygen at 2 liters with activity.

## 2012-12-09 NOTE — Progress Notes (Signed)
  Subjective:    Patient ID: [name], female    DOB: 1930/09/05, 79 y.o.   MRN: [id number]  HPI The patient is a 79 year old female who I am asked to see for dyspnea on exertion.  She carries the diagnosis of COPD, although she has not had PFTs since 2007.  She also has a history of diastolic heart failure, as well as a pulmonary embolus for which she is on Coumadin.  She has had 3 lung cancers, one of which that required right lower lobectomy in 2007, and 2 others that were treated with chemotherapy and radiation.  The most recent was a lingular nodule that was treated the end of last year.  The patient has been on Spiriva for her COPD, but has not been on more aggressive bronchodilators.  She notes worsening shortness of breath over the last 6 months, and we'll get winded just walking through her house.  She has minimal cough, and no mucus production.  She has had some chronic lower extremity edema.  She has had a CT chest in March of this year that surprisingly just showed some scarring, but no acute process.   Review of Systems  Constitutional: Negative for fever and unexpected weight change.  HENT: Negative for ear pain, nosebleeds, congestion, sore throat, rhinorrhea, sneezing, trouble swallowing, dental problem, postnasal drip and sinus pressure.   Eyes: Negative for redness and itching.  Respiratory: Positive for cough and shortness of breath. Negative for chest tightness and wheezing.   Cardiovascular: Negative for palpitations and leg swelling.  Gastrointestinal: Negative for nausea and vomiting.       Acid heartburn//indigestion  Genitourinary: Negative for dysuria.  Musculoskeletal: Positive for joint swelling and arthralgias.  Skin: Negative for rash.  Neurological: Positive for headaches.  Hematological: Does not bruise/bleed easily.  Psychiatric/Behavioral: Negative for dysphoric mood. The patient is not nervous/anxious.        Objective:   Physical  Exam Constitutional:  Overweight female, no acute distress  HENT:  Nares patent without discharge, but narrowed bilat.   Oropharynx without exudate, palate and uvula are normal  Eyes:  Perrla, eomi, no scleral icterus  Neck:  No JVD, no TMG  Cardiovascular:  Normal rate, regular rhythm, no rubs or gallops.  No murmurs        Intact distal pulses but diminished.   Pulmonary :  Mildly decreased breath sounds, no stridor or respiratory distress   No rales, rhonchi, or wheezing  Abdominal:  Soft, nondistended, bowel sounds present.  No tenderness noted.   Musculoskeletal:  mild lower extremity edema noted.  Lymph Nodes:  No cervical lymphadenopathy noted  Skin:  No cyanosis noted  Neurologic:  Alert, appropriate, moves all 4 extremities without obvious deficit.         Assessment & Plan:

## 2012-12-11 ENCOUNTER — Telehealth: Payer: Self-pay | Admitting: *Deleted

## 2012-12-11 NOTE — Telephone Encounter (Signed)
ONO results received and will be given to [facility] on Monday 12/15/12.

## 2012-12-25 ENCOUNTER — Telehealth: Payer: Self-pay | Admitting: Hematology and Oncology

## 2012-12-25 ENCOUNTER — Telehealth: Payer: Self-pay | Admitting: Medical Oncology

## 2012-12-25 ENCOUNTER — Ambulatory Visit (HOSPITAL_BASED_OUTPATIENT_CLINIC_OR_DEPARTMENT_OTHER): Payer: Medicare Other | Admitting: Hematology and Oncology

## 2012-12-25 ENCOUNTER — Other Ambulatory Visit (HOSPITAL_BASED_OUTPATIENT_CLINIC_OR_DEPARTMENT_OTHER): Payer: Medicare Other | Admitting: Lab

## 2012-12-25 VITALS — BP 118/62 | HR 64 | Temp 97.7°F | Resp 20 | Ht 64.5 in | Wt 221.3 lb

## 2012-12-25 DIAGNOSIS — C3410 Malignant neoplasm of upper lobe, unspecified bronchus or lung: Secondary | ICD-10-CM

## 2012-12-25 DIAGNOSIS — C3490 Malignant neoplasm of unspecified part of unspecified bronchus or lung: Secondary | ICD-10-CM

## 2012-12-25 DIAGNOSIS — C801 Malignant (primary) neoplasm, unspecified: Secondary | ICD-10-CM

## 2012-12-25 DIAGNOSIS — J449 Chronic obstructive pulmonary disease, unspecified: Secondary | ICD-10-CM

## 2012-12-25 DIAGNOSIS — R0902 Hypoxemia: Secondary | ICD-10-CM

## 2012-12-25 LAB — CBC WITH DIFFERENTIAL/PLATELET
Basophils Absolute: 0 10*3/uL (ref 0.0–0.1)
Eosinophils Absolute: 0.3 10*3/uL (ref 0.0–0.5)
HGB: 14.4 g/dL (ref 11.6–15.9)
MCV: 90.7 fL (ref 79.5–101.0)
MONO#: 0.6 10*3/uL (ref 0.1–0.9)
MONO%: 7.2 % (ref 0.0–14.0)
NEUT#: 5.9 10*3/uL (ref 1.5–6.5)
Platelets: 224 10*3/uL (ref 145–400)
RDW: 14.5 % (ref 11.2–14.5)

## 2012-12-25 LAB — COMPREHENSIVE METABOLIC PANEL (CC13)
Albumin: 3.3 g/dL — ABNORMAL LOW (ref 3.5–5.0)
Alkaline Phosphatase: 47 U/L (ref 40–150)
BUN: 29.7 mg/dL — ABNORMAL HIGH (ref 7.0–26.0)
CO2: 26 mEq/L (ref 22–29)
Calcium: 9.6 mg/dL (ref 8.4–10.4)
Glucose: 151 mg/dl — ABNORMAL HIGH (ref 70–140)
Potassium: 3.9 mEq/L (ref 3.5–5.1)

## 2012-12-25 LAB — LACTATE DEHYDROGENASE (CC13): LDH: 145 U/L (ref 125–245)

## 2012-12-25 NOTE — Telephone Encounter (Signed)
I called pt and left a message to inquire about missed appointment today. I did see where she saw Dr. Shelle Iron 7/01 and she was not doing well. I asked her to call me back regarding rescheduling appointment.

## 2012-12-25 NOTE — Progress Notes (Signed)
CC:   Oneita Hurt, M.D. [name], NP Foye Deer, MD  PROBLEM LIST:  1. High-grade neuroendocrine lung cancer, small cell, with areas of non-  small cell lung cancer, right lower lobe, with biopsy carried out on  04/16/2005, treated successfully with carboplatin and VP16 from 05/07/2005 through 07/09/2005 and then right lower lobe lobectomy on  08/09/2005.  2. Squamous cell carcinoma of the right lower lobe T1 resected at the  time of surgery on 08/09/2005. All resected lymph nodes were  negative.  3. Non small cell lung cancer of the right upper lobe detected in March  2012, +NABx 09/21/10, with positive PET scan treated with stereotactic  radiation 5000 cGy in 5 fractions from 10/17/2010 through 10/27/2010.  4. Lingular nodule which appears to be increasing in size. This  lesion may have been present on a CT scan of the chest going back  to 08/30/2010. A PET scan carried out on 03/14/2012 showed no evidence of metastatic disease. Core needle biopsy of the lingular nodule was carried out on 04/01/2012 and showed invasive poorly differentiated carcinoma with predominant small cell carcinoma component and minor squamous cell component. Stains for p63, chromogranin and synaptophysin were diffusely positive. Cytokeratin 5/6 was positive in the squamous cell carcinoma component, but negative in the rest of the tumor. The small cell component comprised about 90%  of the tumor. Slides were reviewed with Dr. Hollice Espy. The lesion was  treated with stereotactic body radiotherapy (SBRT), 18 Gy per session, for  a total of 3 sessions, administered on 04/22/2012, 04/24/2012, and 04/29/2012, for a total of 54 Gy. The patient received 4 cycles of carboplatin and VP-16 with Neulasta from 05/19/2012 through 07/31/2012. 5. 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  6. Anticoagulation therapy with Coumadin.  7. History of  diastolic congestive heart failure October 2012.  8. History of colitis and rectal bleeding most recently September  2012.  9. Degenerative disease of the spine status post decompression at L4-  S1 in May 2006.  10. Hypertension.  11. Peripheral vascular disease.  12. Dyslipidemia.  13. History of colonic polyps with high-grade dysplasia noted in a  tubular adenoma.  14. Hearing impairment requiring bilateral hearing aids since 2010.  15. History of a left frontal lobe lesion, possibly a meningioma, seen  on MRI of the head in November 2006 and May 2007.  16. Systolic ejection murmur.  17. Chronic obstructive pulmonary disease.  18. Power PICC line was inserted in the right upper extremity on  07/07/2012 by Interventional Radiology. The PICC line was used for  cycles 3 and 4 and was removed after the final day of  chemotherapy on 07/31/2012.   MEDICATIONS:  Reviewed and recorded. Current Outpatient Prescriptions  Medication Sig Dispense Refill  . acetaminophen (TYLENOL) 650 MG CR tablet Take 650 mg by mouth every 8 (eight) hours as needed. For pain      . alendronate (FOSAMAX) 70 MG tablet Take 70 mg by mouth every 7 (seven) days. Take with a full glass of water on an empty stomach. On Tuesdays      . amLODipine (NORVASC) 5 MG tablet Take 5 mg by mouth daily.      . benazepril (LOTENSIN) 10 MG tablet Take 10 mg by mouth daily.      . cimetidine (TAGAMET) 200 MG tablet Take 200 mg by mouth daily.        . diphenhydramine-acetaminophen (TYLENOL PM)  25-500 MG TABS Take 2 tablets by mouth at bedtime as needed. For sleeplessness      . fenofibrate (TRICOR) 145 MG tablet Take 145 mg by mouth every morning.       . furosemide (LASIX) 40 MG tablet Take 20 mg by mouth every morning.       . Glucosamine-Chondroit-Vit C-Mn (GLUCOSAMINE 1500 COMPLEX PO) Take 750 mg by mouth daily.      . methocarbamol (ROBAXIN) 500 MG tablet Take 500 mg by mouth every 8 (eight) hours as needed. For muscle spasms       . metoprolol tartrate (LOPRESSOR) 25 MG tablet Take 25 mg by mouth 2 (two) times daily.      . Multiple Vitamin (MULTIVITAMIN) tablet Take 1 tablet by mouth daily.      Marland Kitchen omega-3 acid ethyl esters (LOVAZA) 1 G capsule Take 2 g by mouth daily.      Marland Kitchen oxyCODONE-acetaminophen (PERCOCET) 5-325 MG per tablet Take 1 tablet by mouth every 4 (four) hours as needed. For pain      . potassium chloride SA (K-DUR,KLOR-CON) 20 MEQ tablet Take 1 tablet (20 mEq total) by mouth every other day.  15 tablet  1  . sennosides-docusate sodium (SENOKOT-S) 8.6-50 MG tablet Take 1 tablet by mouth daily as needed. For constipation      . simvastatin (ZOCOR) 80 MG tablet Take 80 mg by mouth at bedtime.       Marland Kitchen tiotropium (SPIRIVA) 18 MCG inhalation capsule Place 18 mcg into inhaler and inhale daily.       Marland Kitchen warfarin (COUMADIN) 3 MG tablet Take by mouth daily. Take 1/2 pill Sat and Sun, take whole pill weekdays       No current facility-administered medications for this visit.   IMMUNIZATIONS:  1. Pneumovax was given in October 2012.  2. Flu shot was administered in October 2013.    SMOKING HISTORY: The patient smoked up to a pack and a half of cigarettes a  day for over 50 years. She stopped smoking in June of 2005.   HISTORY:  Salle Brandle was seen today for followup of her recurrent cancers involving the lung as outlined above.  Mrs. Trembley was seen by [place] on 07/29/2012 at which time she received her 4th and final treatment for the cancer involving the lingula which was mostly comprised of small-cell carcinoma with some smaller amount of a squamous cell carcinoma mixed in.  As noted the patient did receive stereotactic radiation treatments given in 3 sessions in mid November 2013 and then received 4 cycles of chemotherapy with carboplatin and VP-16 in combination with Neulasta for 4 cycles from early December 2013 through mid February 2014.  The patient tolerated her chemotherapy well.  She has undergone  a CT scan of the chest with IV contrast carried out on 09/02/2012.  The lingular nodule apparently has resolved.  There was some evidence of a pulmonary artery hypertension and extensive atherosclerosis.  The patient is here today with her daughter, Mariana Kaufman.  The patient is in a wheelchair.  She apparently has oxygen at home which she uses sparingly.  There were no new problems or complaints over the past 6 weeks.  The patient does have chronic back pain and is quite limited. She is here today in a wheelchair.  Blood pressure 118/62, pulse 64, temperature 97.7 F (36.5 C), temperature source Oral, resp. rate 20, height 5' 4.5" (1.638 m), weight 221 lb 4.8 oz (100.381 kg).  PHYSICAL EXAMINATION:  On physical exam there is little change.   The patient was told that she needs to use her oxygen on a continuous basis.  She was also urged to see Dr. Marcelyn Bruins.   believe the patient was due to see him at some point in the past. HEENT:  There was scalp alopecia.  No scleral icterus.  Mouth and pharynx were benign.  No peripheral adenopathy palpable.  Heart and lungs:  Normal.  Rhythm was regular.  I did not appreciate a systolic ejection murmur.  Abdomen:  With the patient sitting is benign. Extremities:  No pitting edema.  The PICC line that was present 6 weeks ago has been removed following chemotherapy.  Neurologic:  Exam is grossly normal.  LABORATORY DATA:    CBC    Component Value Date/Time   WBC 8.5 12/25/2012 1405   WBC 7.9 04/01/2012 0755   RBC 4.69 12/25/2012 1405   RBC 4.83 04/01/2012 0755   HGB 14.4 12/25/2012 1405   HGB 14.8 04/01/2012 0755   HCT 42.5 12/25/2012 1405   HCT 44.4 04/01/2012 0755   PLT 224 12/25/2012 1405   PLT 203 04/01/2012 0755   MCV 90.7 12/25/2012 1405   MCV 91.9 04/01/2012 0755   MCH 30.6 12/25/2012 1405   MCH 30.6 04/01/2012 0755   MCHC 33.8 12/25/2012 1405   MCHC 33.3 04/01/2012 0755   RDW 14.5 12/25/2012 1405   RDW 13.6 04/01/2012 0755    LYMPHSABS 1.7 12/25/2012 1405   LYMPHSABS 2.3 06/30/2011 1043   MONOABS 0.6 12/25/2012 1405   MONOABS 0.7 06/30/2011 1043   EOSABS 0.3 12/25/2012 1405   EOSABS 0.3 06/30/2011 1043   BASOSABS 0.0 12/25/2012 1405   BASOSABS 0.0 06/30/2011 1043    Lab Results  Component Value Date   GLUCOSE 151* 12/25/2012   BUN 29.7* 12/25/2012   CO2 26 12/25/2012   ALT 13 12/25/2012   AST 19 12/25/2012   LDH 144 09/02/2012   K 3.9 12/25/2012   CREATININE 1.5* 12/25/2012     IMAGING STUDIES:  1. PET scan from 09/07/2010 showed hypermetabolic right upper lobe  pulmonary nodule, most consistent with bronchogenic carcinoma. SUV  max was 7.4. There was hypermetabolic activity associated with the  left adrenal gland nodule. This was favored to be a lipid poor  adenoma or hyperplasia.  2. MRI of the head with and without IV contrast on 10/04/2010 showed  no acute or metastatic intracranial abnormality. There was stable  MRI appearance of the brain as compared with a prior study of  11/02/2005. There were acute on chronic sphenoid sinus  inflammatory changes.  3. CT scan of the chest without IV contrast on 01/30/2011 showed  interval reduction in the size of the right lobe pulmonary nodule  without evidence of local recurrence. No evidence for mediastinal  metastatic disease. Left adrenal adenoma was noted. CT scan of  abdomen and pelvis with IV contrast on 02/22/2011 showed no acute  findings. Hepatic steatosis was present. The patient has had  previous hysterectomy. Both adnexa were unremarkable. No masses  or lymphadenopathy noted within the abdomen or pelvis.  4. CT angiogram of the chest on 06/30/2011 showed no evidence for  pulmonary embolism. The patient with status post right lower  lobectomy with no evidence of recurrent or metastatic disease in  the chest.  5. CT scan of the chest without IV contrast on 08/02/2011 showed a  lingular nodule, small in size measuring 4 mm, more prominent than  on the  prior study of 01/30/2011 in retrospect. A malignant lesion  was favored. There was focal air space consolidation in the  superior segment of the left lower lobe felt to be new compared  with the study of 06/30/2011, most consistent with infectious or  inflammatory etiology. There were postoperative changes consistent  with a right lower lobectomy. There was extensive atherosclerotic  calcification of the arterial vasculature and pulmonary arterial  hypertension.  6. Bone scan carried out on 08/20/2011 showed no evidence for bone  metastases. There was increased radio tracer uptake within the  posterior aspect of the lumbar spine consistent with chronic post-  surgical change.  7. CT scan of the chest without IV contrast on 10/31/2011 showed  continued interval enlargement of a now 5 mm nodule in the lingula  seen on image 27 of series 5 suspicious for a small malignant  nodule. Continued followup was suggested. It was felt that this  lesion was below the resolution of a PET scan and too small to  biopsy. The patient was noted to be status post right lower  lobectomy. There was also extensive atherosclerosis involving the  thoracic aorta, great vessels in the mediastinum and the coronary  arteries including calcified atherosclerotic plaque in the left  main, left anterior descending, left circumflex, and right coronary  arteries. There was also extensive calcification of the aortic  valve and mitral annulus. There was a small hiatal hernia. There  were no pathologically enlarged mediastinal or hilar lymph nodes.  8. CT scan of chest without IV contrast on 02/14/2012 showed enlargement of  the lingular nodule consistent with neoplasm. There was pulmonary  artery enlargement suggesting pulmonary arterial hypertension. Hepatic  steatosis and probable hepatomegaly were noted.  9. PET scan on 03/14/2012 showed a 9 x 13 mm lingular nodule with maximum  SUV of 3.9, suspicious for primary  bronchogenic neoplasm. A  metachronous primary was favored. There was no evidence for metastatic  disease. The patient was status post right lower lobectomy.  10. CT-guided core needle biopsy of the left lung lesion was carried  out on 04/01/2012.  11. Chest x-ray, 1 view, from 04/01/2012 showed the nodule in the  lingula surrounded by a halo of airspace disease. There was no  pulmonary edema or pneumothorax. There was stable focal sclerosis  in the proximal left humerus.  12. Chest x-ray, 2 view, from 05/13/2012 showed no acute cardiopulmonary  disease. There were stable post surgical changes and volume loss of the  right lung as well as the known nodule over the lingula.  13. PICC placement with ultrasound and fluoroscopic guidance carried out on  07/07/2012. There was successful placement of a right upper extremity  brachial vein approach, dual-lumen Power PICC with sonographic and  fluoroscopic guidance.  14. Chest CT with IV contrast from 09/02/2012 showed that the previously noted left upper lobe nodule was no longer clearly identified (image 29 of series 5 shows the location of the previously noted nodule).  No suspicious appearing pulmonary nodules or masses were identified.  The patient has had a right lower lobectomy.  There is a background of moderate centrilobular emphysema.  No mediastinal or hilar adenopathy.  There is dilatation of the pulmonary trunk suggesting pulmonary arterial hypertension.  There was extensive atherosclerosis including the left main and 3-vessel coronary artery disease as well as mild fusiform aneurysmal dilatation of the abdominal aorta.   IMPRESSION AND PLAN:  Mrs. Dombrosky seems to be doing well.  She has had an  excellent response to the treatment for the cancer, mostly small cell that involved the left lingula. The patient's daughter does have an oximeter that she can use to monitor the patient's O2 saturations at home.  I have also recommended  that an appointment be made with Dr. Marcelyn Bruins or one of the Pulmonary specialists for further evaluation and management of the patient's hypoxia and emphysema and she will see him next week. We will get Ct scan and call patient with result if abnormal.  RTC in 2 months.  Zachery Dakins, MD 12/25/2012 3:05 PM

## 2012-12-25 NOTE — Telephone Encounter (Signed)
Gave pt appt for lab and Md on october 2014 , also gave pt oral contrast for CT, gave referral order to [facility]

## 2012-12-30 ENCOUNTER — Ambulatory Visit (HOSPITAL_COMMUNITY)
Admission: RE | Admit: 2012-12-30 | Discharge: 2012-12-30 | Disposition: A | Discharge status: Home / Self Care | Payer: Medicare Other | Source: Ambulatory Visit | Attending: Hematology and Oncology | Admitting: Hematology and Oncology

## 2012-12-30 DIAGNOSIS — C801 Malignant (primary) neoplasm, unspecified: Secondary | ICD-10-CM

## 2012-12-30 DIAGNOSIS — E278 Other specified disorders of adrenal gland: Secondary | ICD-10-CM | POA: Insufficient documentation

## 2012-12-30 DIAGNOSIS — J90 Pleural effusion, not elsewhere classified: Secondary | ICD-10-CM | POA: Insufficient documentation

## 2012-12-30 DIAGNOSIS — K7689 Other specified diseases of liver: Secondary | ICD-10-CM | POA: Insufficient documentation

## 2012-12-30 DIAGNOSIS — J8410 Pulmonary fibrosis, unspecified: Secondary | ICD-10-CM | POA: Insufficient documentation

## 2012-12-30 DIAGNOSIS — I700 Atherosclerosis of aorta: Secondary | ICD-10-CM | POA: Insufficient documentation

## 2012-12-30 DIAGNOSIS — C3490 Malignant neoplasm of unspecified part of unspecified bronchus or lung: Secondary | ICD-10-CM | POA: Insufficient documentation

## 2012-12-30 DIAGNOSIS — N281 Cyst of kidney, acquired: Secondary | ICD-10-CM | POA: Insufficient documentation

## 2012-12-30 DIAGNOSIS — M899 Disorder of bone, unspecified: Secondary | ICD-10-CM | POA: Insufficient documentation

## 2012-12-30 DIAGNOSIS — J449 Chronic obstructive pulmonary disease, unspecified: Secondary | ICD-10-CM

## 2012-12-30 DIAGNOSIS — M949 Disorder of cartilage, unspecified: Secondary | ICD-10-CM | POA: Insufficient documentation

## 2012-12-30 MED ORDER — IOHEXOL 300 MG/ML  SOLN
100.0000 mL | Freq: Once | INTRAMUSCULAR | Status: AC | PRN
  Administered 2012-12-30: 100 mL via INTRAVENOUS

## 2013-01-01 ENCOUNTER — Ambulatory Visit (INDEPENDENT_AMBULATORY_CARE_PROVIDER_SITE_OTHER): Payer: Medicare Other | Admitting: Pulmonary Disease

## 2013-01-01 ENCOUNTER — Other Ambulatory Visit: Payer: Self-pay | Admitting: Pulmonary Disease

## 2013-01-01 ENCOUNTER — Encounter: Payer: Self-pay | Admitting: Pulmonary Disease

## 2013-01-01 VITALS — BP 110/58 | HR 60 | Temp 97.0°F | Ht 63.5 in | Wt 222.0 lb

## 2013-01-01 DIAGNOSIS — R0602 Shortness of breath: Secondary | ICD-10-CM

## 2013-01-01 LAB — PULMONARY FUNCTION TEST

## 2013-01-01 NOTE — Assessment & Plan Note (Signed)
The patient's pulmonary function studies surprisingly did not show any significant obstructive lung disease.  She does have mild restriction related to her morbid obesity and prior lung resection, and her diffusion capacity can be down for the same reasons and also her diastolic heart failure and prior history of thromboembolic disease.  At this point, I think she needs to work aggressively on weight reduction and conditioning to try and improve her shortness of breath.  I do not see a lot from a pulmonary standpoint that we can improve, and in fact I've asked her to discontinue her Spiriva.  She is to stay on oxygen, and to followup with me as needed.

## 2013-01-01 NOTE — Patient Instructions (Addendum)
You do not have copd by your breathing studies.  Therefore, stop spiriva. Work on weight loss and building up your conditioning.  This may be the only way to improve your breathing. Keep fluid off your legs, and followup with cardiology. Stay on oxygen with activity.  Will call you about the overnight oxygen test once we receive the results. Would be happy to see you again as needed.

## 2013-01-01 NOTE — Progress Notes (Signed)
PFT done today. 

## 2013-01-01 NOTE — Progress Notes (Signed)
  Subjective:    Patient ID: [name], female    DOB: 05-19-1931, 79 y.o.   MRN: [id number]  HPI The patient comes in today for followup of her pulmonary function studies, as part of a workup for dyspnea on exertion.  Surprisingly, she was found to have no significant obstructive lung disease, mild restriction, and a severe decrease in diffusion capacity that nearly corrected to normal with alveolar volume adjustment.  I have reviewed the studies with her in detail, and answered all of her questions.   Review of Systems  Constitutional: Negative for fever and unexpected weight change.  HENT: Negative for ear pain, nosebleeds, congestion, sore throat, rhinorrhea, sneezing, trouble swallowing, dental problem, postnasal drip and sinus pressure.   Eyes: Negative for redness and itching.  Respiratory: Negative for cough, chest tightness, shortness of breath and wheezing.   Cardiovascular: Negative for palpitations and leg swelling.  Gastrointestinal: Negative for nausea and vomiting.  Genitourinary: Negative for dysuria.  Musculoskeletal: Negative for joint swelling.  Skin: Negative for rash.  Neurological: Negative for headaches.  Hematological: Does not bruise/bleed easily.  Psychiatric/Behavioral: Negative for dysphoric mood. The patient is not nervous/anxious.        Objective:   Physical Exam Morbidly obese female in no acute distress Nose without purulence or discharge noted Neck without lymphadenopathy or thyromegaly Lower extremities with 1+ edema bilaterally, no cyanosis Alert and oriented, moves all 4 extremities.       Assessment & Plan:

## 2013-01-02 ENCOUNTER — Telehealth: Payer: Self-pay | Admitting: *Deleted

## 2013-01-13 ENCOUNTER — Telehealth: Payer: Self-pay | Admitting: Pulmonary Disease

## 2013-01-13 NOTE — Telephone Encounter (Signed)
[name], [facility], asks to speak w/ nurse regarding the same.  Can be reached at [phone].  Antionette Fairy

## 2013-01-13 NOTE — Telephone Encounter (Signed)
[email or web address] Castillo, CMA  

## 2013-01-14 NOTE — Telephone Encounter (Signed)
ATC family at # provided above - mailbox is full and cannot accept new msgs. [email or web address] with [employer]

## 2013-01-14 NOTE — Telephone Encounter (Signed)
I spoke with Melissa. She thought [employer] had called again on pt. I advised her does not look like it. Looks like we are trying to get in touch with pt.  Called # listed and received same message. Mailbox is full and not enough space to leave a message. WCB

## 2013-01-14 NOTE — Telephone Encounter (Signed)
Melissa, [facility], asking to speak w/ triage.  Can be reached at [phone].  Antionette Fairy

## 2013-01-14 NOTE — Telephone Encounter (Signed)
Spoke with Melissa.  [facility]'s intake dept mis interpretted the O2 order placed by [facility] for nocturnal o2.  Melissa has clarified this with them to keep daytime o2 and add nocturnal o2.  [facility] will NOT pick up pt's home fill system.  -----  ATC pt back at # provided above.  The mailbox is full and cannot accept new msgs. ATC the # listed as pt's cell #, went directly to msg stating the VM box is not set up.  WCB

## 2013-01-15 NOTE — Telephone Encounter (Signed)
ATC daughter but NA and advised mailbox is full and can't leave a message wcb

## 2013-01-16 NOTE — Telephone Encounter (Signed)
Caller aware that issue has been resolved and patient will keep her O2 fill system.

## 2013-02-24 ENCOUNTER — Other Ambulatory Visit: Payer: Self-pay

## 2013-02-24 DIAGNOSIS — C3490 Malignant neoplasm of unspecified part of unspecified bronchus or lung: Secondary | ICD-10-CM

## 2013-02-25 ENCOUNTER — Encounter: Payer: Self-pay | Admitting: Internal Medicine

## 2013-02-25 ENCOUNTER — Other Ambulatory Visit (HOSPITAL_BASED_OUTPATIENT_CLINIC_OR_DEPARTMENT_OTHER): Payer: Medicare Other | Admitting: Lab

## 2013-02-25 ENCOUNTER — Ambulatory Visit (HOSPITAL_BASED_OUTPATIENT_CLINIC_OR_DEPARTMENT_OTHER): Payer: Medicare Other | Admitting: Internal Medicine

## 2013-02-25 ENCOUNTER — Telehealth: Payer: Self-pay | Admitting: Internal Medicine

## 2013-02-25 DIAGNOSIS — C801 Malignant (primary) neoplasm, unspecified: Secondary | ICD-10-CM

## 2013-02-25 DIAGNOSIS — C3410 Malignant neoplasm of upper lobe, unspecified bronchus or lung: Secondary | ICD-10-CM

## 2013-02-25 DIAGNOSIS — C3490 Malignant neoplasm of unspecified part of unspecified bronchus or lung: Secondary | ICD-10-CM

## 2013-02-25 LAB — COMPREHENSIVE METABOLIC PANEL (CC13)
ALT: 11 U/L (ref 0–55)
AST: 16 U/L (ref 5–34)
BUN: 33.3 mg/dL — ABNORMAL HIGH (ref 7.0–26.0)
Calcium: 9.5 mg/dL (ref 8.4–10.4)
Creatinine: 1.5 mg/dL — ABNORMAL HIGH (ref 0.6–1.1)
Total Bilirubin: 0.38 mg/dL (ref 0.20–1.20)

## 2013-02-25 LAB — CBC WITH DIFFERENTIAL/PLATELET
BASO%: 0.6 % (ref 0.0–2.0)
Basophils Absolute: 0.1 10*3/uL (ref 0.0–0.1)
EOS%: 2.8 % (ref 0.0–7.0)
HCT: 44.7 % (ref 34.8–46.6)
HGB: 14.8 g/dL (ref 11.6–15.9)
LYMPH%: 20.5 % (ref 14.0–49.7)
MCH: 30.1 pg (ref 25.1–34.0)
MCHC: 33.2 g/dL (ref 31.5–36.0)
MCV: 90.7 fL (ref 79.5–101.0)
NEUT%: 68.3 % (ref 38.4–76.8)
Platelets: 219 10*3/uL (ref 145–400)

## 2013-02-25 LAB — LACTATE DEHYDROGENASE (CC13): LDH: 140 U/L (ref 125–245)

## 2013-02-25 NOTE — Progress Notes (Signed)
[facility] OFFICE PROGRESS NOTE  MOON,AMY, NP [address] [place] [address]  DIAGNOSIS: Lung cancer, left  Lung cancer, upper lobe, right - Plan: CBC with Differential, Comprehensive metabolic panel  Cancer  Chief Complaint  Patient presents with  . Lung Cancer    CURRENT THERAPY: None  INTERVAL HISTORY: KINDALL SWABY 79 y.o. female was seen today for followup of her recurrent cancers involving the lung as outlined below. Mrs. Ticer was seen by [place] on 12/25/2012.  She had a CT of Chest/Abdomen/Pelvis on 12/30/2012. Today, she is accompanied by a friend who reports she is doing ok.   There were no new problems or complaints over the past 6 weeks. The patient does have chronic back pain and is quite limited. She is here today in a wheelchair.  MEDICAL HISTORY: Past Medical History  Diagnosis Date  . Cancer     lung ca, RUL  . CHF (congestive heart failure)   . Hypertension   . Hx pulmonary embolism 03/2011    on coumadin till 02/2011,stopped  due to gi bleed  . GERD (gastroesophageal reflux disease)   . GI bleed   . Colitis     history  . Chest pain 06/30/11    hx blood clot in lung  . Bell's palsy   . PVD (peripheral vascular disease)   . Lung cancer     metachronous stage I nscca RUL  . History of radiation therapy 5/8/10th,14th,16th,and 10/27/2010    RUL lung nscca  . Blood transfusion     during lumbar surgery  . History of chemotherapy 2006-207    carboplatin/VP16 last January 207  . Clotting disorder     DVT/ PE  . Lung cancer 04/01/12    biopsy-lingular nodule-inv carcinoma, minor squamous cell ca  . History of radiation therapy 11/12, 11/14, 04/29/2012    lingular -squamous cell carcinoma, nodule   ONCOLOGY HISTORY: 1. High-grade neuroendocrine lung cancer, small cell, with areas of non-small cell lung cancer, right lower lobe, with biopsy carried out on 04/16/2005, treated successfully with carboplatin and VP16 from  05/07/2005 through 07/09/2005 and then right lower lobe lobectomy on 08/09/2005.  2. Squamous cell carcinoma of the right lower lobe T1 resected at the time of surgery on 08/09/2005. All resected lymph nodes were  negative.  3. Non small cell lung cancer of the right upper lobe detected in March 2012, +NABx 09/21/10, with positive PET scan treated with stereotactic radiation 5000 cGy in 5 fractions from 10/17/2010 through 10/27/2010.  4. Lingular nodule which appears to be increasing in size. This  lesion may have been present on a CT scan of the chest going back to 08/30/2010. A PET scan carried out on 03/14/2012 showed no evidence of metastatic disease. Core needle biopsy of the lingular nodule was carried out on 04/01/2012 and showed invasive poorly differentiated carcinoma with predominant small cell carcinoma component and minor squamous cell component. Stains for p63, chromogranin and synaptophysin were diffusely positive. Cytokeratin 5/6 was positive in the squamous cell carcinoma component, but negative in the rest of the tumor. The small cell component comprised about 90% of the tumor. Slides were reviewed with Dr. Hollice Espy. The lesion was treated with stereotactic body radiotherapy (SBRT), 18 Gy per session, for  a total of 3 sessions, administered on 04/22/2012, 04/24/2012, and 04/29/2012, for a total of 54 Gy. The patient received 4 cycles of carboplatin and VP-16 with Neulasta from 05/19/2012 through 07/31/2012.  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    ALLERGIES:  is allergic to aspirin.  MEDICATIONS: has a current medication list which includes the following prescription(s): acetaminophen, alendronate, amlodipine, benazepril, cimetidine, diphenhydramine-acetaminophen, fenofibrate, furosemide, glucosamine-chondroit-vit c-mn, methocarbamol, metoprolol tartrate, multivitamin, omega-3 acid ethyl esters, oxycodone-acetaminophen, potassium chloride sa, sennosides-docusate sodium, simvastatin, tiotropium, and warfarin.  SURGICAL HISTORY:  Past Surgical History  Procedure Laterality Date  . Right lower lobe lung resection  2007  . Cataract surgery      b/l  lens implants  . Abdominal hysterectomy      partial age 74  . Shoulder open rotator cuff repair  04/2003    left  . Appendectomy    . Carpal tunnel release    . Bladder surgery    . Lumbar spine surgery      x3, L4-S1  . Right lung biopsy  2006  . Coronary angioplasty with stent placement  2001    renal stent  . Colonoscopies  2011    polyp removal     REVIEW OF SYSTEMS:   Constitutional: Denies fevers, chills or abnormal weight loss Eyes: Denies blurriness of vision Ears, nose, mouth, throat, and face: Denies mucositis or sore throat; + hard of hearing Respiratory: Denies cough, dyspnea or wheezes Cardiovascular: Denies palpitation, chest discomfort or lower extremity swelling Gastrointestinal:  Denies nausea, heartburn or change in bowel habits Skin: Denies abnormal skin rashes Lymphatics: Denies new lymphadenopathy or easy bruising Neurological:Denies numbness, tingling or new weaknesses Behavioral/Psych: Mood is stable, no new changes  All other systems were reviewed with the patient and are negative.  PHYSICAL EXAMINATION: ECOG PERFORMANCE STATUS: 2 - Symptomatic, <50% confined to bed  Blood pressure 105/63, pulse 73, temperature 97.6 F (36.4 C), temperature source Oral, resp. rate 20, height 5' 3.5" (1.613 m), weight 222 lb  (100.699 kg).  GENERAL:alert, no distress and comfortable; Sitting in a wheelchair with oxygen nasal cannula.  Alopecia SKIN: skin color, texture, turgor are normal, no rashes or significant lesions EYES: normal, Conjunctiva are pink and non-injected, sclera clear OROPHARYNX:no exudate, no erythema and lips, buccal mucosa, and tongue normal  NECK: supple, thyroid normal size, non-tender, without nodularity LYMPH:  no palpable lymphadenopathy in the cervical, axillary or inguinal LUNGS: clear to auscultation and percussion with normal breathing effort HEART: regular rate & rhythm and no murmurs and no lower extremity edema ABDOMEN:abdomen soft, non-tender and normal bowel sounds with the patient sitting Musculoskeletal:no cyanosis of digits and no clubbing  NEURO: alert & oriented x 3 with fluent speech, no focal motor/sensory deficits   LABORATORY DATA: Results for orders placed in visit on 02/25/13 (from the past 48 hour(s))  CBC WITH DIFFERENTIAL     Status: Abnormal   Collection Time    02/25/13  3:02 PM      Result Value Range   WBC 10.3  3.9 - 10.3 10e3/uL   NEUT# 7.1 (*) 1.5 - 6.5 10e3/uL   HGB 14.8  11.6 - 15.9 g/dL   HCT 16.1  09.6 - 04.5 %   Platelets 219  145 - 400 10e3/uL   MCV 90.7  79.5 - 101.0 fL  MCH 30.1  25.1 - 34.0 pg   MCHC 33.2  31.5 - 36.0 g/dL   RBC 1.61  0.96 - 0.45 10e6/uL   RDW 14.6 (*) 11.2 - 14.5 %   lymph# 2.1  0.9 - 3.3 10e3/uL   MONO# 0.8  0.1 - 0.9 10e3/uL   Eosinophils Absolute 0.3  0.0 - 0.5 10e3/uL   Basophils Absolute 0.1  0.0 - 0.1 10e3/uL   NEUT% 68.3  38.4 - 76.8 %   LYMPH% 20.5  14.0 - 49.7 %   MONO% 7.8  0.0 - 14.0 %   EOS% 2.8  0.0 - 7.0 %   BASO% 0.6  0.0 - 2.0 %  LACTATE DEHYDROGENASE (CC13)     Status: None   Collection Time    02/25/13  3:03 PM      Result Value Range   LDH 140  125 - 245 U/L  COMPREHENSIVE METABOLIC PANEL (CC13)     Status: Abnormal   Collection Time    02/25/13  3:03 PM      Result Value Range   Sodium  138  136 - 145 mEq/L   Potassium 3.8  3.5 - 5.1 mEq/L   Chloride 103  98 - 109 mEq/L   CO2 26  22 - 29 mEq/L   Glucose 124  70 - 140 mg/dl   BUN 40.9 (*) 7.0 - 81.1 mg/dL   Creatinine 1.5 (*) 0.6 - 1.1 mg/dL   Total Bilirubin 9.14  0.20 - 1.20 mg/dL   Alkaline Phosphatase 38 (*) 40 - 150 U/L   AST 16  5 - 34 U/L   ALT 11  0 - 55 U/L   Total Protein 7.3  6.4 - 8.3 g/dL   Albumin 3.4 (*) 3.5 - 5.0 g/dL   Calcium 9.5  8.4 - 78.2 mg/dL    RADIOGRAPHIC STUDIES: CT C/A/P 12/30/2012  CT CHEST, ABDOMEN AND PELVIS WITH CONTRAST  Technique: Multidetector CT imaging of the chest, abdomen and  pelvis was performed following the standard protocol during bolus  administration of intravenous contrast.  Contrast: OMNIPAQUE IOHEXOL 300 MG/ML SOLN  Comparison: 09/02/2012  CT CHEST  Findings: Very small right pleural effusion appears similar to  previous exam. Right upper lobe scarring and architectural  distortion is unchanged. Small focal area of subpleural  consolidation within the posterior medial right lower lobe is  identified, image 26/series 5. This measures 1.5 x 1.1 cm, image  26/series 5. Previously 1.4 x 0.7 cm. Areas of ground-glass  attenuation, architectural distortion and mild airspace  consolidation are noted in the left upper lobe. These may be the  sequela of external beam radiation, inflammation and/or infection.  Mild cardiac enlargement. No pericardial effusion.  No enlarged or enlarging mediastinal lymph nodes identified. The  main pulmonary artery measures 3.6 cm, image 23/series 3. This is unchanged from previous exam.  No axillary or supraclavicular adenopathy identified. Review of  the visualized osseous structures is significant for osteopenia.  There are no aggressive lytic or sclerotic bone lesions identified.  IMPRESSION:  1. Increase and ground-glass attenuation, architectural distortion  and fibrosis within the left upper lobe. Findings are favored to   represent changes from external beam radiation.  2. There is a focal area of subpleural consolidation within the  medial right lower lobe which appears increased from previous exam.  This may also be related to changes from external beam radiation.  Advise attention on follow-up imaging.  CT ABDOMEN AND PELVIS  Findings:  There is mild diffuse fatty infiltration throughout the  liver. No focal liver abnormalities noted. The gallbladder  appears normal. No biliary dilatation. Normal appearance of the  pancreas. The spleen is normal.  The right adrenal gland is normal. There is a nodule in the left  adrenal gland which measures 1.5 cm, image number 58/series 3.  This is unchanged from previous exam.  The right kidney is normal. Several cysts noted within the left  kidney. Several of these measure less than 1 cm and are too small  to reliably characterize.  The urinary bladder appears normal. Previous hysterectomy. The  adnexal structures have a normal physiologic appearance for  patient's age. Calcified atherosclerotic disease affects the  abdominal aorta. There is no aneurysm noted. No upper abdominal  adenopathy identified. There is no pelvic or inguinal adenopathy.  The stomach is normal. The small bowel loops have a normal course  and caliber. No obstruction. Normal appearance of the colon.  The review of the visualized osseous structures shows postoperative  changes from lumbar fusion of the L4-S1.  IMPRESSION:  1. No acute findings identified within the abdomen or pelvis.  2. Stable left adrenal nodule.  3. Hepatic steatosis noted.   ASSESSMENT:  Lung cancer as noted above.   PLAN:  --Mrs. Cazarez continues to be doing well.  --CT of C/A/P reviewed with no acute findings noted in the abdomen or pelvis and external beam radiation noted within the chest which will require follow-up. --RTC in 2 months. Patient requests to Dr. Welton Flakes.   All questions were answered. The patient  knows to call the clinic with any problems, questions or concerns. We can certainly see the patient much sooner if necessary.  I spent 15 minutes counseling the patient face to face. The total time spent in the appointment was 30 minutes.    Kathelyn Gombos, MD 02/25/2013 9:49 PM

## 2013-02-25 NOTE — Telephone Encounter (Signed)
Pt wants to see Dr. Welton Flakes, gave POF to Dory Peru

## 2013-03-01 ENCOUNTER — Encounter: Payer: Self-pay | Admitting: Internal Medicine

## 2013-03-04 ENCOUNTER — Ambulatory Visit: Payer: Medicare Other | Admitting: Cardiovascular Disease

## 2013-03-06 ENCOUNTER — Ambulatory Visit: Payer: Medicare Other | Admitting: Cardiovascular Disease

## 2013-03-17 ENCOUNTER — Telehealth: Payer: Self-pay | Admitting: Pulmonary Disease

## 2013-03-17 DIAGNOSIS — R0602 Shortness of breath: Secondary | ICD-10-CM

## 2013-03-17 NOTE — Telephone Encounter (Signed)
I spoke with daughter. She is requesting if we can send in an order to [facility] for a POC. She reports [facility] took her portable tank and replaced it with larger tanks. She stated [facility] told her they will need an order to get POC. Please advise Dr. Shelle Iron thanks

## 2013-03-17 NOTE — Telephone Encounter (Signed)
I was told that [facility] was no longer supporting POC's.  Have them look at what kind of POC.  They typically have the very large ones on wheels, not the smaller ones that can be carried over shoulder.  If they do have the smaller ones at advanced let us know and we will send in order.

## 2013-03-18 ENCOUNTER — Telehealth (HOSPITAL_COMMUNITY): Payer: Self-pay | Admitting: *Deleted

## 2013-03-18 NOTE — Telephone Encounter (Signed)
I spoke with Melissa and she states they have small portable systems that weigh 2-3 lbs that can be carried over the shoulder. She states we can send an order for portable evaluation and they will get the pt set up on this or what ever is needed. Order placed. Carron Curie, CMA

## 2013-03-18 NOTE — Telephone Encounter (Signed)
I spoke with Melissa and she is going to check on this for pt. Will await call back

## 2013-03-19 ENCOUNTER — Encounter: Payer: Self-pay | Admitting: Radiation Oncology

## 2013-03-19 ENCOUNTER — Ambulatory Visit
Admission: RE | Admit: 2013-03-19 | Discharge: 2013-03-19 | Disposition: A | Discharge status: Home / Self Care | Payer: Medicare Other | Source: Ambulatory Visit | Attending: Radiation Oncology | Admitting: Radiation Oncology

## 2013-03-19 NOTE — Progress Notes (Signed)
Up s/p rad txs, lung cancer 04/22/12; 04/24/12; 04/29/13, patient in w/c, oxygen 2 liters n/c at 96% , mild sob, no c/o pain, but stated just started Diflucan yesterday,  100mg  po bid x 10 days, have a lung infection, appetite too good stated patient, fatigued, slight edema in feet, takes lasix daily, stopped potasium 2 weeks ago,   Per daughter, stated  2 weeks ago almost fell, speech off and wouldn't go to the hospital, did go to [place] next day ct done, no stroke stated daughter, patient has knee pain,  Difficult to walk with her walker 3:29 PM

## 2013-03-19 NOTE — Progress Notes (Signed)
Radiation Oncology         [phone] ________________________________  Name: [name] MRN: [id number]  Date: 03/19/2013  DOB: 10/02/1930  Follow-Up Visit Note  CC: [name], NP  [name], MD  Diagnosis:   79 yo woman with multiple metachronous lung cancers s/p  1. Curative SBRT for clincal IA NSCLC of the right upper lobe treated to 50 Gy in 5 fractions in May 2012  2. Curative SBRT for clinical stage IA mixed histology bronchogenic small cell and squamous cell carcinoma of the lingula treated to 54 Gy in 3 fractions in November 2013   Interval Since Last Radiation:  10  months  Narrative:  The patient returns today for routine follow-up.  patient in w/c, oxygen 2 liters n/c at 96% , mild sob, no c/o pain, but stated just started Diflucan yesterday, 100mg  po bid x 10 days, have a lung infection, appetite too good stated patient, fatigued, slight edema in feet, takes lasix daily, stopped potasium 2 weeks ago, Per daughter, stated 2 weeks ago almost fell, speech off and wouldn't go to the hospital, did go to [place] next day ct done, no stroke stated daughter, patient has knee pain, Difficult to walk with her walker                   ALLERGIES:  is allergic to aspirin.  Meds: Current Outpatient Prescriptions  Medication Sig Dispense Refill  . acetaminophen (TYLENOL) 650 MG CR tablet Take 650 mg by mouth every 8 (eight) hours as needed. For pain      . alendronate (FOSAMAX) 70 MG tablet Take 70 mg by mouth every 7 (seven) days. Take with a full glass of water on an empty stomach. On Tuesdays      . amLODipine (NORVASC) 5 MG tablet Take 5 mg by mouth daily.      . benazepril (LOTENSIN) 10 MG tablet Take 10 mg by mouth daily.      . cimetidine (TAGAMET) 200 MG tablet Take 200 mg by mouth daily.        . diphenhydramine-acetaminophen (TYLENOL PM) 25-500 MG TABS Take 2 tablets by mouth at bedtime as needed. For sleeplessness      . fenofibrate (TRICOR) 145 MG tablet Take 145  mg by mouth every morning.       . fluconazole (DIFLUCAN) 100 MG tablet Take 100 mg by mouth 2 (two) times daily. Saw Amy Moon nurse practiononer 03/18/13, pt has lung infection, to take bid x 10 days      . furosemide (LASIX) 40 MG tablet Take 20 mg by mouth every morning.       . Glucosamine-Chondroit-Vit C-Mn (GLUCOSAMINE 1500 COMPLEX PO) Take 750 mg by mouth daily.      . methocarbamol (ROBAXIN) 500 MG tablet Take 500 mg by mouth every 8 (eight) hours as needed. For muscle spasms      . metoprolol tartrate (LOPRESSOR) 25 MG tablet Take 25 mg by mouth 2 (two) times daily.      . Multiple Vitamin (MULTIVITAMIN) tablet Take 1 tablet by mouth daily.      Marland Kitchen omega-3 acid ethyl esters (LOVAZA) 1 G capsule Take 2 g by mouth daily.      Marland Kitchen oxyCODONE-acetaminophen (PERCOCET) 5-325 MG per tablet Take 1 tablet by mouth every 4 (four) hours as needed. For pain      . sennosides-docusate sodium (SENOKOT-S) 8.6-50 MG tablet Take 1 tablet by mouth daily as needed. For constipation      .  simvastatin (ZOCOR) 80 MG tablet Take 80 mg by mouth at bedtime.       Marland Kitchen tiotropium (SPIRIVA) 18 MCG inhalation capsule Place 18 mcg into inhaler and inhale daily.       Marland Kitchen warfarin (COUMADIN) 3 MG tablet Take by mouth daily. Take 1/2 pill Sat and Sun, take whole pill weekdays       No current facility-administered medications for this encounter.    Physical Findings: The patient is in no acute distress. Patient is alert and oriented.  weight is 224 lb 14.4 oz (102.014 kg). Her oral temperature is 98.1 F (36.7 C). Her blood pressure is 111/73 and her pulse is 91. Her respiration is 22 and oxygen saturation is 96%. .  No significant changes.  Impression:  The patient is recovering from the effects of radiation.  She needs chest ct  Plan:  Chest CT now, if stable, repeat in 6 months then f/u  _____________________________________  Artist Pais. Kathrynn Running, M.D.

## 2013-03-20 ENCOUNTER — Telehealth: Payer: Self-pay | Admitting: *Deleted

## 2013-03-20 NOTE — Telephone Encounter (Signed)
Called patient to ask about scheduling her test, lvm for a return call

## 2013-03-23 ENCOUNTER — Telehealth: Payer: Self-pay | Admitting: *Deleted

## 2013-03-23 ENCOUNTER — Ambulatory Visit (INDEPENDENT_AMBULATORY_CARE_PROVIDER_SITE_OTHER): Payer: Medicare Other | Admitting: Cardiovascular Disease

## 2013-03-23 ENCOUNTER — Encounter: Payer: Self-pay | Admitting: Cardiovascular Disease

## 2013-03-23 VITALS — BP 122/80 | HR 85 | Ht 64.5 in | Wt 227.7 lb

## 2013-03-23 DIAGNOSIS — I4891 Unspecified atrial fibrillation: Secondary | ICD-10-CM

## 2013-03-23 DIAGNOSIS — I480 Paroxysmal atrial fibrillation: Secondary | ICD-10-CM

## 2013-03-23 DIAGNOSIS — E785 Hyperlipidemia, unspecified: Secondary | ICD-10-CM

## 2013-03-23 DIAGNOSIS — I739 Peripheral vascular disease, unspecified: Secondary | ICD-10-CM

## 2013-03-23 NOTE — Assessment & Plan Note (Signed)
On statin therapy followed by her PCP 

## 2013-03-23 NOTE — Patient Instructions (Signed)
Your physician wants you to follow-up in: 6 months with Laura Ingold NP and 1 year with Dr Berry.  You will receive a reminder letter in the mail two months in advance. If you don't receive a letter, please call our office to schedule the follow-up appointment.  

## 2013-03-23 NOTE — Assessment & Plan Note (Signed)
History of distal abdominal aortic stenting by myself at 2001. She now is minimally ambulatory and walks with the aid of a cane. Suspect this is related to her COPD.

## 2013-03-23 NOTE — Progress Notes (Signed)
03/23/2013 [name]   Oct 15, 1930  [id number]  Primary Physician [name], NP Primary Cardiologist: Runell Gess MD [name]   HPI:  This 79 year old patient of Dr. Landry Dyke is here today for a followup. The patient has a complex history of vascular disease, COPD and lung cancer. She has had previously a documented DVT and pulmonary embolism and is on chronic anticoagulation with Coumadin which is followed through her primary care office. She has a history of aortic stenting by Dr. Allyson Sabal in 2001 and in the summer of 2013 she had a CT scan revealing calcifications in her coronaries. A Myoview stress study was done in June of 2013, it was a low-risk study. The last echo in 2012 revealed normal LV function, EF of 55-60% and grade 1 diastolic dysfunction. She had lung surgery in 2007, radiation and chemo in 2012 and 2013. She is through with her treatments and she has been declared cancer-free per her daughter.  She has also had some irregular heart rhythm, underwent a CardioNet monitor which revealed paroxysmal atrial fibrillation and maybe some multifocal atrial tachycardia. She is asymptomatic and has been stable since that time. On her last visit in February her blood pressure was low at 94/60, heart rate was 101. Mr. Diona Fanti, the PA, stopped her Lotrel and cut her furosemide to 20 mg a day, and he also put her on metoprolol 25 b.i.d. for the PAF, and she was to resume her lisinopril 10 mg a day.   Since that time she has actually done quite well. They tell me that 30% of her right lung is gone and she had the lung cancer in the left. She also has chronic back pain and she has had 3 surgeries. It is difficult to get her EKG today to have her lie back to do the EKG. She has no complaints, denies chest pain. Her shortness of breath is noticeable walking into the clinic, and outside the clinic she is short of breath, but this is her baseline per the daughter and the patient  since her lung cancer had been diagnosed. Weight is stable, exactly the same. No recent colds or fevers. No claudication. She does have joint pain with arthritis, no rashes or ulcers, no blurred vision or double vision. Respirations as stated. Cardiovascular: No edema, no chest pain. GI: No indigestion, diarrhea, constipation or melena. GU: No hematuria or dysuria. She is diabetic and is controlled. No thyroid disease. She has had some lightheadedness and dizziness but no syncope. She complains of restless legs. She did try Requip, but she read the side effects and so she stopped it. She should follow up with her primary care for those complaints.  Since she was seen here in the office 6 months ago there is a question of a stroke. She is on Coumadin up-regulation and has a history of paroxysmal atrial fibrillation. Otherwise, she denies chest pain and is chronically short of breath.    Current Outpatient Prescriptions  Medication Sig Dispense Refill  . acetaminophen (TYLENOL) 650 MG CR tablet Take 650 mg by mouth every 8 (eight) hours as needed. For pain      . alendronate (FOSAMAX) 70 MG tablet Take 70 mg by mouth every 7 (seven) days. Take with a full glass of water on an empty stomach. On Tuesdays      . amLODipine (NORVASC) 5 MG tablet Take 5 mg by mouth daily.      . benazepril (LOTENSIN) 10 MG tablet Take  10 mg by mouth daily.      . cimetidine (TAGAMET) 200 MG tablet Take 200 mg by mouth daily.        . diphenhydramine-acetaminophen (TYLENOL PM) 25-500 MG TABS Take 2 tablets by mouth at bedtime as needed. For sleeplessness      . fenofibrate (TRICOR) 145 MG tablet Take 145 mg by mouth every morning.       . fluconazole (DIFLUCAN) 100 MG tablet Take 100 mg by mouth 2 (two) times daily. Saw Amy Moon nurse practiononer 03/18/13, pt has lung infection, to take bid x 10 days      . furosemide (LASIX) 40 MG tablet Take 20 mg by mouth every morning.       . Glucosamine-Chondroit-Vit C-Mn (GLUCOSAMINE  1500 COMPLEX PO) Take 750 mg by mouth daily.      . methocarbamol (ROBAXIN) 500 MG tablet Take 500 mg by mouth every 8 (eight) hours as needed. For muscle spasms      . metoprolol tartrate (LOPRESSOR) 25 MG tablet Take 25 mg by mouth 2 (two) times daily.      . Multiple Vitamin (MULTIVITAMIN) tablet Take 1 tablet by mouth daily.      Marland Kitchen omega-3 acid ethyl esters (LOVAZA) 1 G capsule Take 2 g by mouth daily.      Marland Kitchen oxyCODONE-acetaminophen (PERCOCET) 5-325 MG per tablet Take 1 tablet by mouth every 4 (four) hours as needed. For pain      . OXYGEN-HELIUM IN Inhale 2 L into the lungs continuous.      . sennosides-docusate sodium (SENOKOT-S) 8.6-50 MG tablet Take 1 tablet by mouth daily as needed. For constipation      . simvastatin (ZOCOR) 80 MG tablet Take 80 mg by mouth at bedtime.       Marland Kitchen tiotropium (SPIRIVA) 18 MCG inhalation capsule Place 18 mcg into inhaler and inhale daily.       Marland Kitchen warfarin (COUMADIN) 3 MG tablet Take by mouth daily. Take 1/2 pill Sat and Sun, take whole pill weekdays       No current facility-administered medications for this visit.    Allergies  Allergen Reactions  . Aspirin Other (See Comments)    Stomach pain    History   Social History  . Marital Status: Widowed    Spouse Name: N/A    Number of Children: 3  . Years of Education: N/A   Occupational History  . retired    Social History Main Topics  . Smoking status: Former Smoker -- 2.00 packs/day for 54 years    Types: Cigarettes    Quit date: 12/01/2003  . Smokeless tobacco: Never Used  . Alcohol Use: No  . Drug Use: No     Comment: quit 2005  . Sexual Activity: No   Other Topics Concern  . Not on file   Social History Narrative  . No narrative on file     Review of Systems: General: negative for chills, fever, night sweats or weight changes.  Cardiovascular: negative for chest pain, dyspnea on exertion, edema, orthopnea, palpitations, paroxysmal nocturnal dyspnea or shortness of  breath Dermatological: negative for rash Respiratory: negative for cough or wheezing Urologic: negative for hematuria Abdominal: negative for nausea, vomiting, diarrhea, bright red blood per rectum, melena, or hematemesis Neurologic: negative for visual changes, syncope, or dizziness All other systems reviewed and are otherwise negative except as noted above.    Blood pressure 122/80, pulse 85, height 5' 4.5" (1.638 m), weight 227 lb 11.2  oz (103.284 kg).  General appearance: alert and no distress Neck: no adenopathy, no carotid bruit, no JVD, supple, symmetrical, trachea midline and thyroid not enlarged, symmetric, no tenderness/mass/nodules Lungs: clear to auscultation bilaterally Heart: regular rate and rhythm, S1, S2 normal, no murmur, click, rub or gallop Extremities: extremities normal, atraumatic, no cyanosis or edema  EKG normal sinus rhythm at 85 with occasional PVC  ASSESSMENT AND PLAN:   PVD (peripheral vascular disease) History of distal abdominal aortic stenting by myself at 2001. She now is minimally ambulatory and walks with the aid of a cane. Suspect this is related to her COPD.  Paroxysmal atrial fibrillation On Coumadin anticoagulation maintaining sinus rhythm. She's also had a history of DVT and pulmonary embolism in the past.  Hyperlipidemia On statin therapy followed by her PCP      Runell Gess MD [facility], [facility] 03/23/2013 3:43 PM

## 2013-03-23 NOTE — Telephone Encounter (Signed)
CALLED PATIENT TO INFORM OF TEST, SPOKE WITH [name] AND SHE IS AWARE OF THIS TEST.

## 2013-03-23 NOTE — Assessment & Plan Note (Signed)
On Coumadin anticoagulation maintaining sinus rhythm. She's also had a history of DVT and pulmonary embolism in the past.

## 2013-03-24 ENCOUNTER — Encounter: Payer: Self-pay | Admitting: Cardiovascular Disease

## 2013-03-27 ENCOUNTER — Telehealth: Payer: Self-pay | Admitting: Oncology

## 2013-03-27 ENCOUNTER — Ambulatory Visit: Payer: Medicare Other

## 2013-03-27 NOTE — Telephone Encounter (Signed)
Add to previous note. lmonvm for pt re appt for 11/20 and mailed schedule. Per 9/17 pof pt request KK - ok per email response from KK ([facility]).

## 2013-03-27 NOTE — Telephone Encounter (Signed)
Per 9/17 pof from Dr. Rosie Fate 37mo f/u w/KK per pt request. Ok  Per KK. lmonvm for pt re appt for lb/KK 11/20 @ 11:30am.

## 2013-04-30 ENCOUNTER — Other Ambulatory Visit (HOSPITAL_BASED_OUTPATIENT_CLINIC_OR_DEPARTMENT_OTHER): Payer: Medicare Other | Admitting: Lab

## 2013-04-30 ENCOUNTER — Telehealth: Payer: Self-pay | Admitting: Oncology

## 2013-04-30 ENCOUNTER — Ambulatory Visit (HOSPITAL_BASED_OUTPATIENT_CLINIC_OR_DEPARTMENT_OTHER): Payer: Medicare Other | Admitting: Oncology

## 2013-04-30 DIAGNOSIS — C3410 Malignant neoplasm of upper lobe, unspecified bronchus or lung: Secondary | ICD-10-CM

## 2013-04-30 DIAGNOSIS — C7A090 Malignant carcinoid tumor of the bronchus and lung: Secondary | ICD-10-CM

## 2013-04-30 LAB — COMPREHENSIVE METABOLIC PANEL (CC13)
Albumin: 3.5 g/dL (ref 3.5–5.0)
Anion Gap: 11 mEq/L (ref 3–11)
BUN: 31 mg/dL — ABNORMAL HIGH (ref 7.0–26.0)
Calcium: 10.8 mg/dL — ABNORMAL HIGH (ref 8.4–10.4)
Chloride: 103 mEq/L (ref 98–109)
Creatinine: 1.3 mg/dL — ABNORMAL HIGH (ref 0.6–1.1)
Glucose: 149 mg/dl — ABNORMAL HIGH (ref 70–140)
Potassium: 3.9 mEq/L (ref 3.5–5.1)

## 2013-04-30 LAB — CBC WITH DIFFERENTIAL/PLATELET
Basophils Absolute: 0 10*3/uL (ref 0.0–0.1)
EOS%: 2.7 % (ref 0.0–7.0)
Eosinophils Absolute: 0.2 10*3/uL (ref 0.0–0.5)
HCT: 41.1 % (ref 34.8–46.6)
HGB: 13.5 g/dL (ref 11.6–15.9)
MCH: 30.1 pg (ref 25.1–34.0)
MCV: 91.6 fL (ref 79.5–101.0)
NEUT#: 6.3 10*3/uL (ref 1.5–6.5)
NEUT%: 73.4 % (ref 38.4–76.8)
RDW: 14.3 % (ref 11.2–14.5)
lymph#: 1.5 10*3/uL (ref 0.9–3.3)

## 2013-04-30 NOTE — Progress Notes (Signed)
[name] OFFICE PROGRESS NOTE  [name],AMY, NP [address] [place] [address]  DIAGNOSIS: Lung cancer, unspecified laterality - Plan: CBC with Differential, Comprehensive metabolic panel, CT Chest Wo Contrast  Chief Complaint  Patient presents with  . Follow-up    CURRENT THERAPY: None  INTERVAL HISTORY: [name] 79 y.o. female was seen today for followup of her recurrent cancers involving the lung as outlined below. Mrs. [name] was seen by [place] on 12/25/2012.  She had a CT of Chest/Abdomen/Pelvis on 12/30/2012. Today, she is accompanied by a friend who reports she is doing ok.   There were no new problems or complaints over the past 6 weeks. The patient does have chronic back pain and is quite limited. She is here today in a wheelchair.  MEDICAL HISTORY: Past Medical History  Diagnosis Date  . Cancer     lung ca, RUL  . CHF (congestive heart failure)   . Hypertension   . Hx pulmonary embolism 03/2011    on coumadin till 02/2011,stopped  due to gi bleed  . GERD (gastroesophageal reflux disease)   . GI bleed   . Colitis     history  . Chest pain 06/30/11    hx blood clot in lung  . Bell's palsy   . PVD (peripheral vascular disease)   . Lung cancer     metachronous stage I nscca RUL  . History of radiation therapy 5/8/10th,14th,16th,and 10/27/2010    RUL lung nscca  . Blood transfusion     during lumbar surgery  . History of chemotherapy 2006-207    carboplatin/VP16 last January 207  . Clotting disorder     DVT/ PE  . Lung cancer 04/01/12    biopsy-lingular nodule-inv carcinoma, minor squamous cell ca  . History of radiation therapy 11/12, 11/14, 04/29/2012    lingular -squamous cell carcinoma, nodule  . Paroxysmal atrial fibrillation   . On continuous oral anticoagulation    ONCOLOGY HISTORY: 1. High-grade neuroendocrine lung cancer, small cell, with areas of non-small cell lung cancer, right lower lobe, with biopsy carried out on  04/16/2005, treated successfully with carboplatin and VP16 from 05/07/2005 through 07/09/2005 and then right lower lobe lobectomy on 08/09/2005.  2. Squamous cell carcinoma of the right lower lobe T1 resected at the time of surgery on 08/09/2005. All resected lymph nodes were  negative.  3. Non small cell lung cancer of the right upper lobe detected in March 2012, +NABx 09/21/10, with positive PET scan treated with stereotactic radiation 5000 cGy in 5 fractions from 10/17/2010 through 10/27/2010.  4. Lingular nodule which appears to be increasing in size. This  lesion may have been present on a CT scan of the chest going back to 08/30/2010. A PET scan carried out on 03/14/2012 showed no evidence of metastatic disease. Core needle biopsy of the lingular nodule was carried out on 04/01/2012 and showed invasive poorly differentiated carcinoma with predominant small cell carcinoma component and minor squamous cell component. Stains for p63, chromogranin and synaptophysin were diffusely positive. Cytokeratin 5/6 was positive in the squamous cell carcinoma component, but negative in the rest of the tumor. The small cell component comprised about 90% of the tumor. Slides were reviewed with Dr. Hollice [name]. The lesion was treated with stereotactic body radiotherapy (SBRT), 18 Gy per session, for  a total of 3 sessions, administered on 04/22/2012, 04/24/2012, and 04/29/2012, for a total of 54 Gy. The patient received 4 cycles of carboplatin and  VP-16 with Neulasta from 05/19/2012 through 07/31/2012.  INTERIM HISTORY: Patient is seen in followup today clinically she seems to be doing well. Her sister tells me that she moved into her niece's house to get better care. Patient denies any headaches double vision blurring of vision nausea vomiting she does have baseline shortness of breath. She is wearing oxygen. Remainder of the 10 point review of systems is negative.    ALLERGIES:  is allergic to  aspirin.  MEDICATIONS: has a current medication list which includes the following prescription(s): ropinirole, acetaminophen, alendronate, amlodipine, benazepril, cimetidine, diphenhydramine-acetaminophen, fenofibrate, fluconazole, furosemide, glucosamine-chondroit-vit c-mn, methocarbamol, metoprolol tartrate, multivitamin, omega-3 acid ethyl esters, oxycodone-acetaminophen, oxygen-helium, sennosides-docusate sodium, simvastatin, tiotropium, and warfarin.  SURGICAL HISTORY:  Past Surgical History  Procedure Laterality Date  . Right lower lobe lung resection  2007  . Cataract surgery      b/l  lens implants  . Abdominal hysterectomy      partial age 54  . Shoulder open rotator cuff repair  04/2003    left  . Appendectomy    . Carpal tunnel release    . Bladder surgery    . Lumbar spine surgery      x3, L4-S1  . Right lung biopsy  2006  . Coronary angioplasty with stent placement  2001    renal stent  . Colonoscopies  2011    polyp removal     REVIEW OF SYSTEMS:   Constitutional: Denies fevers, chills or abnormal weight loss Eyes: Denies blurriness of vision Ears, nose, mouth, throat, and face: Denies mucositis or sore throat; + hard of hearing Respiratory: Denies cough, dyspnea or wheezes Cardiovascular: Denies palpitation, chest discomfort or lower extremity swelling Gastrointestinal:  Denies nausea, heartburn or change in bowel habits Skin: Denies abnormal skin rashes Lymphatics: Denies new lymphadenopathy or easy bruising Neurological:Denies numbness, tingling or new weaknesses Behavioral/Psych: Mood is stable, no new changes  All other systems were reviewed with the patient and are negative.  PHYSICAL EXAMINATION: ECOG PERFORMANCE STATUS: 2 - Symptomatic, <50% confined to bed  Blood pressure 115/66, pulse 74, temperature 98.2 F (36.8 C), temperature source Oral, resp. rate 17, height 5\' 4"  (1.626 m), weight 0 lb (0 kg), SpO2 97.00%, peak flow 2 L/min.  GENERAL:alert,  no distress and comfortable; Sitting in a wheelchair with oxygen nasal cannula.  Alopecia SKIN: skin color, texture, turgor are normal, no rashes or significant lesions EYES: normal, Conjunctiva are pink and non-injected, sclera clear OROPHARYNX:no exudate, no erythema and lips, buccal mucosa, and tongue normal  NECK: supple, thyroid normal size, non-tender, without nodularity LYMPH:  no palpable lymphadenopathy in the cervical, axillary or inguinal LUNGS: clear to auscultation and percussion with normal breathing effort HEART: regular rate & rhythm and no murmurs and no lower extremity edema ABDOMEN:abdomen soft, non-tender and normal bowel sounds with the patient sitting Musculoskeletal:no cyanosis of digits and no clubbing  NEURO: alert & oriented x 3 with fluent speech, no focal motor/sensory deficits   LABORATORY DATA: Results for orders placed in visit on 04/30/13 (from the past 48 hour(s))  CBC WITH DIFFERENTIAL     Status: None   Collection Time    04/30/13 11:41 AM      Result Value Range   WBC 8.5  3.9 - 10.3 10e3/uL   NEUT# 6.3  1.5 - 6.5 10e3/uL   HGB 13.5  11.6 - 15.9 g/dL   HCT 16.1  09.6 - 04.5 %   Platelets 194  145 - 400 10e3/uL  MCV 91.6  79.5 - 101.0 fL   MCH 30.1  25.1 - 34.0 pg   MCHC 32.9  31.5 - 36.0 g/dL   RBC 1.61  0.96 - 0.45 10e6/uL   RDW 14.3  11.2 - 14.5 %   lymph# 1.5  0.9 - 3.3 10e3/uL   MONO# 0.5  0.1 - 0.9 10e3/uL   Eosinophils Absolute 0.2  0.0 - 0.5 10e3/uL   Basophils Absolute 0.0  0.0 - 0.1 10e3/uL   NEUT% 73.4  38.4 - 76.8 %   LYMPH% 17.4  14.0 - 49.7 %   MONO% 6.2  0.0 - 14.0 %   EOS% 2.7  0.0 - 7.0 %   BASO% 0.3  0.0 - 2.0 %  COMPREHENSIVE METABOLIC PANEL (CC13)     Status: Abnormal   Collection Time    04/30/13 11:41 AM      Result Value Range   Sodium 142  136 - 145 mEq/L   Potassium 3.9  3.5 - 5.1 mEq/L   Chloride 103  98 - 109 mEq/L   CO2 28  22 - 29 mEq/L   Glucose 149 (*) 70 - 140 mg/dl   BUN 40.9 (*) 7.0 - 81.1 mg/dL    Creatinine 1.3 (*) 0.6 - 1.1 mg/dL   Total Bilirubin 9.14  0.20 - 1.20 mg/dL   Alkaline Phosphatase 40  40 - 150 U/L   AST 18  5 - 34 U/L   ALT 11  0 - 55 U/L   Total Protein 7.3  6.4 - 8.3 g/dL   Albumin 3.5  3.5 - 5.0 g/dL   Calcium 78.2 (*) 8.4 - 10.4 mg/dL   Anion Gap 11  3 - 11 mEq/L    RADIOGRAPHIC STUDIES: CT C/A/P 12/30/2012  CT CHEST, ABDOMEN AND PELVIS WITH CONTRAST  Technique: Multidetector CT imaging of the chest, abdomen and  pelvis was performed following the standard protocol during bolus  administration of intravenous contrast.  Contrast: OMNIPAQUE IOHEXOL 300 MG/ML SOLN  Comparison: 09/02/2012  CT CHEST  Findings: Very small right pleural effusion appears similar to  previous exam. Right upper lobe scarring and architectural  distortion is unchanged. Small focal area of subpleural  consolidation within the posterior medial right lower lobe is  identified, image 26/series 5. This measures 1.5 x 1.1 cm, image  26/series 5. Previously 1.4 x 0.7 cm. Areas of ground-glass  attenuation, architectural distortion and mild airspace  consolidation are noted in the left upper lobe. These may be the  sequela of external beam radiation, inflammation and/or infection.  Mild cardiac enlargement. No pericardial effusion.  No enlarged or enlarging mediastinal lymph nodes identified. The  main pulmonary artery measures 3.6 cm, image 23/series 3. This is unchanged from previous exam.  No axillary or supraclavicular adenopathy identified. Review of  the visualized osseous structures is significant for osteopenia.  There are no aggressive lytic or sclerotic bone lesions identified.  IMPRESSION:  1. Increase and ground-glass attenuation, architectural distortion  and fibrosis within the left upper lobe. Findings are favored to  represent changes from external beam radiation.  2. There is a focal area of subpleural consolidation within the  medial right lower lobe which  appears increased from previous exam.  This may also be related to changes from external beam radiation.  Advise attention on follow-up imaging.  CT ABDOMEN AND PELVIS  Findings: There is mild diffuse fatty infiltration throughout the  liver. No focal liver abnormalities noted. The gallbladder  appears normal.  No biliary dilatation. Normal appearance of the  pancreas. The spleen is normal.  The right adrenal gland is normal. There is a nodule in the left  adrenal gland which measures 1.5 cm, image number 58/series 3.  This is unchanged from previous exam.  The right kidney is normal. Several cysts noted within the left  kidney. Several of these measure less than 1 cm and are too small  to reliably characterize.  The urinary bladder appears normal. Previous hysterectomy. The  adnexal structures have a normal physiologic appearance for  patient's age. Calcified atherosclerotic disease affects the  abdominal aorta. There is no aneurysm noted. No upper abdominal  adenopathy identified. There is no pelvic or inguinal adenopathy.  The stomach is normal. The small bowel loops have a normal course  and caliber. No obstruction. Normal appearance of the colon.  The review of the visualized osseous structures shows postoperative  changes from lumbar fusion of the L4-S1.  IMPRESSION:  1. No acute findings identified within the abdomen or pelvis.  2. Stable left adrenal nodule.  3. Hepatic steatosis noted.   ASSESSMENT:  Lung cancer as noted above.   PLAN:  Patient is doing well. She is seen for the first time today. Apparently she did have some scans performed at [facility]. Unfortunately I do not have the results of those today.  Patient and I and her sister discussed her diagnosis and prognosis as well as future treatment options. I do think at this time observation is the most important thing. We however do need some scans performed and I will plan on doing these in 3 months time. I  have recommended that she have these done at [name] so that we have the results at the time of the visit. They will be done a week before hand.  All questions were answered. The patient knows to call the clinic with any problems, questions or concerns. We can certainly see the patient much sooner if necessary.  I spent 15 minutes counseling the patient face to face. The total time spent in the appointment was 30 minutes.    Drue Second, MD 04/30/2013 6:51 PM

## 2013-05-18 ENCOUNTER — Emergency Department (HOSPITAL_COMMUNITY)
Admission: EM | Admit: 2013-05-18 | Discharge: 2013-05-18 | Disposition: A | Discharge status: Home / Self Care | Payer: Medicare Other | Attending: Emergency Medicine | Admitting: Emergency Medicine

## 2013-05-18 ENCOUNTER — Emergency Department (HOSPITAL_COMMUNITY): Payer: Medicare Other

## 2013-05-18 ENCOUNTER — Encounter (HOSPITAL_COMMUNITY): Payer: Self-pay | Admitting: Emergency Medicine

## 2013-05-18 DIAGNOSIS — I509 Heart failure, unspecified: Secondary | ICD-10-CM | POA: Insufficient documentation

## 2013-05-18 DIAGNOSIS — K219 Gastro-esophageal reflux disease without esophagitis: Secondary | ICD-10-CM | POA: Insufficient documentation

## 2013-05-18 DIAGNOSIS — I4891 Unspecified atrial fibrillation: Secondary | ICD-10-CM | POA: Insufficient documentation

## 2013-05-18 DIAGNOSIS — I959 Hypotension, unspecified: Secondary | ICD-10-CM | POA: Diagnosis present

## 2013-05-18 DIAGNOSIS — Z9861 Coronary angioplasty status: Secondary | ICD-10-CM | POA: Insufficient documentation

## 2013-05-18 DIAGNOSIS — C3491 Malignant neoplasm of unspecified part of right bronchus or lung: Secondary | ICD-10-CM | POA: Diagnosis present

## 2013-05-18 DIAGNOSIS — N390 Urinary tract infection, site not specified: Secondary | ICD-10-CM

## 2013-05-18 DIAGNOSIS — Z923 Personal history of irradiation: Secondary | ICD-10-CM | POA: Insufficient documentation

## 2013-05-18 DIAGNOSIS — Z79899 Other long term (current) drug therapy: Secondary | ICD-10-CM | POA: Insufficient documentation

## 2013-05-18 DIAGNOSIS — Z9221 Personal history of antineoplastic chemotherapy: Secondary | ICD-10-CM | POA: Insufficient documentation

## 2013-05-18 DIAGNOSIS — R0602 Shortness of breath: Secondary | ICD-10-CM | POA: Insufficient documentation

## 2013-05-18 DIAGNOSIS — Z85118 Personal history of other malignant neoplasm of bronchus and lung: Secondary | ICD-10-CM | POA: Insufficient documentation

## 2013-05-18 DIAGNOSIS — Z862 Personal history of diseases of the blood and blood-forming organs and certain disorders involving the immune mechanism: Secondary | ICD-10-CM | POA: Insufficient documentation

## 2013-05-18 DIAGNOSIS — Z86711 Personal history of pulmonary embolism: Secondary | ICD-10-CM | POA: Insufficient documentation

## 2013-05-18 DIAGNOSIS — I10 Essential (primary) hypertension: Secondary | ICD-10-CM | POA: Insufficient documentation

## 2013-05-18 DIAGNOSIS — R911 Solitary pulmonary nodule: Secondary | ICD-10-CM | POA: Insufficient documentation

## 2013-05-18 DIAGNOSIS — Z87891 Personal history of nicotine dependence: Secondary | ICD-10-CM | POA: Insufficient documentation

## 2013-05-18 DIAGNOSIS — Z7901 Long term (current) use of anticoagulants: Secondary | ICD-10-CM | POA: Insufficient documentation

## 2013-05-18 LAB — BASIC METABOLIC PANEL
CO2: 27 mEq/L (ref 19–32)
Calcium: 10 mg/dL (ref 8.4–10.5)
Creatinine, Ser: 1.2 mg/dL — ABNORMAL HIGH (ref 0.50–1.10)
GFR calc non Af Amer: 42 mL/min — ABNORMAL LOW (ref >90)
Glucose, Bld: 178 mg/dL — ABNORMAL HIGH (ref 70–99)

## 2013-05-18 LAB — URINALYSIS, ROUTINE W REFLEX MICROSCOPIC
Bilirubin Urine: NEGATIVE
Glucose, UA: NEGATIVE mg/dL
Ketones, ur: NEGATIVE mg/dL
Nitrite: NEGATIVE
Specific Gravity, Urine: 1.034 — ABNORMAL HIGH (ref 1.005–1.030)
pH: 5.5 (ref 5.0–8.0)

## 2013-05-18 LAB — CG4 I-STAT (LACTIC ACID): Lactic Acid, Venous: 1.37 mmol/L (ref 0.5–2.2)

## 2013-05-18 LAB — PROTIME-INR: Prothrombin Time: 20.7 seconds — ABNORMAL HIGH (ref 11.6–15.2)

## 2013-05-18 LAB — PRO B NATRIURETIC PEPTIDE: Pro B Natriuretic peptide (BNP): 196.5 pg/mL (ref 0–450)

## 2013-05-18 LAB — CBC
HCT: 40 % (ref 36.0–46.0)
MCH: 31.1 pg (ref 26.0–34.0)
MCHC: 34 g/dL (ref 30.0–36.0)
MCV: 91.5 fL (ref 78.0–100.0)
Platelets: 217 10*3/uL (ref 150–400)
RBC: 4.37 MIL/uL (ref 3.87–5.11)

## 2013-05-18 LAB — URINE MICROSCOPIC-ADD ON

## 2013-05-18 LAB — POCT I-STAT TROPONIN I

## 2013-05-18 MED ORDER — SODIUM CHLORIDE 0.9 % IV BOLUS (SEPSIS)
1000.0000 mL | Freq: Once | INTRAVENOUS | Status: AC
  Administered 2013-05-18: 1000 mL via INTRAVENOUS

## 2013-05-18 MED ORDER — HYDROCODONE-ACETAMINOPHEN 5-325 MG PO TABS: 1.0000 | ORAL_TABLET | Freq: Four times a day (QID) | ORAL | Status: DC | PRN

## 2013-05-18 MED ORDER — HYDROCOD POLST-CHLORPHEN POLST 10-8 MG/5ML PO LQCR: 5.0000 mL | Freq: Every evening | ORAL | Status: DC | PRN

## 2013-05-18 MED ORDER — LEVOFLOXACIN IN D5W 750 MG/150ML IV SOLN
750.0000 mg | Freq: Once | INTRAVENOUS | Status: AC
  Administered 2013-05-18: 750 mg via INTRAVENOUS
  Filled 2013-05-18: qty 150

## 2013-05-18 MED ORDER — LEVOFLOXACIN 500 MG PO TABS: 500.0000 mg | ORAL_TABLET | Freq: Every day | ORAL | Status: DC

## 2013-05-18 MED ORDER — MORPHINE SULFATE 4 MG/ML IJ SOLN: 4.0000 mg | Freq: Once | INTRAMUSCULAR | Status: DC

## 2013-05-18 MED ORDER — IOHEXOL 350 MG/ML SOLN
80.0000 mL | Freq: Once | INTRAVENOUS | Status: AC | PRN
  Administered 2013-05-18: 80 mL via INTRAVENOUS

## 2013-05-18 NOTE — ED Notes (Signed)
Dr. Beacher May requested Levaquin to be stopped. Levaquin stopped.

## 2013-05-18 NOTE — ED Notes (Addendum)
Pt. reports left lateral ribcage pain with SOB and productive cough for several days , denies injury or fall , no nausea or diaphoresis . No fever or chills. Pt. Has a history of COPD / lung cancer and CHF.

## 2013-05-18 NOTE — ED Notes (Signed)
Per Dr Silverio Lay instruction, Levaquin restarted

## 2013-05-18 NOTE — Consult Note (Signed)
[facility] History and Physical  LEATHIA FARNELL ZOX:[id number] DOB: 1930-12-12 DOA: 05/18/2013  Referring physician: ED PCP: [name], NP  Chief Complaint: Chest pain  HPI: [name] is a 79 y.o. female who presents to the ED with left sided chest pain.  Chest pain onset today, after a fall.  X ray diagnosed 2 acute left sided rib fractures.  The patients pain is controlled at rest without medications.  Initially there was some concern that streaky opacities also seen on the X ray represented PNA; however CTA performed (which also ruled out PE), demonstrated that these were just scarring from prior radiation treatment.  Unfortunately the CTA also demonstrated a spiculated pulmonary nodule which appears to be recurrent malignancy.  During the course of the patients ED stay she was noted to have blood pressures as low as 90/50, though she denies dizziness, or other symptoms associated with hypotension and continues to urinate normally.  Review of Systems: 12 systems reviewed and otherwise negative.  Past Medical History  Diagnosis Date  . Cancer     lung ca, RUL  . CHF (congestive heart failure)   . Hypertension   . Hx pulmonary embolism 03/2011    on coumadin till 02/2011,stopped  due to gi bleed  . GERD (gastroesophageal reflux disease)   . GI bleed   . Colitis     history  . Chest pain 06/30/11    hx blood clot in lung  . Bell's palsy   . PVD (peripheral vascular disease)   . Lung cancer     metachronous stage I nscca RUL  . History of radiation therapy 5/8/10th,14th,16th,and 10/27/2010    RUL lung nscca  . Blood transfusion     during lumbar surgery  . History of chemotherapy 2006-207    carboplatin/VP16 last January 207  . Clotting disorder     DVT/ PE  . Lung cancer 04/01/12    biopsy-lingular nodule-inv carcinoma, minor squamous cell ca  . History of radiation therapy 11/12, 11/14, 04/29/2012    lingular -squamous cell carcinoma, nodule  . Paroxysmal atrial  fibrillation   . On continuous oral anticoagulation    Past Surgical History  Procedure Laterality Date  . Right lower lobe lung resection  2007  . Cataract surgery      b/l  lens implants  . Abdominal hysterectomy      partial age 19  . Shoulder open rotator cuff repair  04/2003    left  . Appendectomy    . Carpal tunnel release    . Bladder surgery    . Lumbar spine surgery      x3, L4-S1  . Right lung biopsy  2006  . Coronary angioplasty with stent placement  2001    renal stent  . Colonoscopies  2011    polyp removal    Social History:  reports that she quit smoking about 9 years ago. Her smoking use included Cigarettes. She has a 108 pack-year smoking history. She has never used smokeless tobacco. She reports that she does not drink alcohol or use illicit drugs.   Allergies  Allergen Reactions  . Aspirin Other (See Comments)    Stomach pain    Family History  Problem Relation Age of Onset  . Breast cancer Mother   . Uterine cancer Sister   . Lung cancer Sister   . Breast cancer Maternal Grandmother   . Colon cancer Sister   . Uterine cancer Sister     Prior to  Admission medications   Medication Sig Start Date End Date Taking? Authorizing Provider  acetaminophen (TYLENOL) 650 MG CR tablet Take 650 mg by mouth every 8 (eight) hours as needed. For pain   Yes Historical Provider, MD  alendronate (FOSAMAX) 70 MG tablet Take 70 mg by mouth every 7 (seven) days. Take with a full glass of water on an empty stomach. On Tuesdays   Yes Historical Provider, MD  amLODipine (NORVASC) 5 MG tablet Take 5 mg by mouth daily.   Yes Historical Provider, MD  benazepril (LOTENSIN) 10 MG tablet Take 10 mg by mouth daily.   Yes Historical Provider, MD  cimetidine (TAGAMET) 200 MG tablet Take 200 mg by mouth daily.     Yes Historical Provider, MD  diphenhydramine-acetaminophen (TYLENOL PM) 25-500 MG TABS Take 2 tablets by mouth at bedtime as needed. For sleeplessness   Yes Historical  Provider, MD  fenofibrate (TRICOR) 145 MG tablet Take 145 mg by mouth every morning.    Yes Historical Provider, MD  fluconazole (DIFLUCAN) 100 MG tablet Take 100 mg by mouth 2 (two) times daily. Saw Amy Moon nurse practiononer 03/18/13, pt has lung infection, to take bid x 10 days 03/18/13  Yes Historical Provider, MD  furosemide (LASIX) 40 MG tablet Take 20 mg by mouth every morning.    Yes Historical Provider, MD  methocarbamol (ROBAXIN) 500 MG tablet Take 500 mg by mouth every 8 (eight) hours as needed. For muscle spasms   Yes Historical Provider, MD  metoprolol tartrate (LOPRESSOR) 25 MG tablet Take 25 mg by mouth 2 (two) times daily.   Yes Historical Provider, MD  omega-3 acid ethyl esters (LOVAZA) 1 G capsule Take 2 g by mouth daily.   Yes Historical Provider, MD  oxyCODONE-acetaminophen (PERCOCET) 5-325 MG per tablet Take 1 tablet by mouth every 4 (four) hours as needed. For pain   Yes Historical Provider, MD  rOPINIRole (REQUIP) 0.25 MG tablet Take 0.25 mg by mouth at bedtime.   Yes Historical Provider, MD  sennosides-docusate sodium (SENOKOT-S) 8.6-50 MG tablet Take 1 tablet by mouth daily as needed. For constipation   Yes Historical Provider, MD  simvastatin (ZOCOR) 80 MG tablet Take 80 mg by mouth at bedtime.    Yes Historical Provider, MD  tiotropium (SPIRIVA) 18 MCG inhalation capsule Place 18 mcg into inhaler and inhale daily.    Yes Historical Provider, MD  warfarin (COUMADIN) 3 MG tablet Take 1.5-3 mg by mouth See admin instructions. Take 1.5 mg on Sat and Sun, Monday-Friday pt takes 3 mg   Yes Historical Provider, MD  OXYGEN-HELIUM IN Inhale 2 L into the lungs continuous.    Historical Provider, MD   Physical Exam: Filed Vitals:   05/18/13 2055  BP: 90/50  Pulse:   Temp:   Resp:      General:  NAD, resting comfortably in bed  Eyes: PEERLA EOMI  ENT: mucous membranes moist  Neck: supple w/o JVD  Cardiovascular: RRR w/o MRG  Respiratory: CTA B  Abdomen: soft, nt,  nd, bs+  Skin: no rash nor lesion  Musculoskeletal: MAE, full ROM all 4 extremities  Psychiatric: normal tone and affect  Neurologic: AAOx3, grossly non-focal   Labs on Admission:  Basic Metabolic Panel:  Recent Labs Lab 05/18/13 1642  NA 136  K 3.6  CL 96  CO2 27  GLUCOSE 178*  BUN 42*  CREATININE 1.20*  CALCIUM 10.0   Liver Function Tests: No results found for this basename: AST, ALT, ALKPHOS, BILITOT,  PROT, ALBUMIN,  in the last 168 hours No results found for this basename: LIPASE, AMYLASE,  in the last 168 hours No results found for this basename: AMMONIA,  in the last 168 hours CBC:  Recent Labs Lab 05/18/13 1642  WBC 11.3*  HGB 13.6  HCT 40.0  MCV 91.5  PLT 217   Cardiac Enzymes: No results found for this basename: CKTOTAL, CKMB, CKMBINDEX, TROPONINI,  in the last 168 hours  BNP (last 3 results)  Recent Labs  05/18/13 1642  PROBNP 196.5   CBG: No results found for this basename: GLUCAP,  in the last 168 hours  Radiological Exams on Admission: Dg Ribs Unilateral W/chest Left  05/18/2013   CLINICAL DATA:  Pain left upper ribs for 3-4 weeks since fall in bathtub  EXAM: LEFT RIBS AND CHEST - 3+ VIEW  COMPARISON:  03/31/2013 CT thorax and chest radiograph 05/13/2012  FINDINGS: Rounded area of opacity right upper lobe measures 2.4 cm. This was not present on the prior chest radiograph and prior chest CT shows mild infiltrate in the area but no evidence of mass. There is chronic elevation of the right diaphragm.  On the left, in the mid to lower lung zone there is mild interstitial opacity and there is an approximately 1 cm rounded nodule in the lateral left midlung. There is no pneumothorax or effusion.  A BB is placed at the level of pain. The lateral left 4th rib shows mild cortical discontinuity and mild proliferative bony change. This occurs at a level nearly approximate to the level of the BB. More inferiorly and more directly at the level of the BB, the  anterior left 5th rib shows very subtle cortical disc continuity.  IMPRESSION: 1. Two subacute nondisplaced left rib fractures. 2. Recent CT thorax performed March 31, 2013 showed findings most consistent with radiation therapy. No lung masses were appreciated. The 1 cm nodular opacity seen in the lateral left mid lung today may represent fibrosis, although development of a pulmonary nodule is not excluded. 3. 2.5 cm hazy right upper lobe opacity. There was mild infiltrate in this area on prior CT scan considered most consistent with fibrosis. However, interval development of a right upper lobe lung mass or area of consolidation is not excluded. Given the bilateral parenchymal densities, nonemergent repeat CT thorax should be considered to exclude interval development of lung masses since the prior CT.   Electronically Signed   By: Esperanza Heir M.D.   On: 05/18/2013 18:09   Ct Angio Chest Pe W/cm &/or Wo Cm  05/18/2013   CLINICAL DATA:  Left lateral rib cage pain. Shortness of breath and cough for several days. History of COPD and lung cancer, CHF.  EXAM: CT ANGIOGRAPHY CHEST WITH CONTRAST  CONTRAST:  80mL OMNIPAQUE IOHEXOL 350 MG/ML SOLN  COMPARISON:  03/31/2013 and earlier  FINDINGS: The pulmonary arteries are well opacified by contrast bolus. There is no evidence for acute pulmonary embolus. The heart is enlarged. There are coronary artery calcifications. In the left mid lung, there is streaky change consistent previous radiation treatment. Scarring is identified in the right upper lobe. Within the right lower lobe, there is a 16 mm irregular nodular opacity, abutting the pleura on image number 66. This may represent an area of scarring. The density appears more convex and masslike, raising the question of malignancy. Followup is recommended. No new consolidations are identified.  Images of the upper abdomen show a stable left adrenal nodule, consistent with benign process. Degenerative  changes are seen  in the spine.  Review of the MIP images confirms the above findings.  IMPRESSION: 1. Technically adequate exam showing no evidence for acute pulmonary embolus. 2. Right lower lobe spiculated nodule, suspicious for malignancy. Further evaluation is recommended.  TECHNIQUE:2: TECHNIQUE:2 Multidetector CT imaging of the chest was performed using the standard protocol during bolus administration of intravenous contrast. Multiplanar CT image reconstructions including MIPs were obtained to evaluate the vascular anatomy.   Electronically Signed   By: Rosalie Gums M.D.   On: 05/18/2013 19:36    EKG: Independently reviewed.  Assessment/Plan Principal Problem:   Hypotension Active Problems:   Non-small cell cancer of right lung   1. Hypotension - patient very slightly hypotensive however patient is completely asymptomatic from this standpoint (able to ambulate at baseline with assistance at bedside with no lightheadedness or other symptoms), kidney function appears to be at her baseline at this time.  Patient is clearly not in shock, and actually feels better and wants to go home.  Feel that this is reasonable, would recommend holding the patients Lotensin at this point and close BP monitoring at home which is possible given that patient has good support system in place (home health, etc).   Code Status: Full (must indicate code status--if unknown or must be presumed, indicate so) Family Communication: Daughter at bedside (indicate person spoken with, if applicable, with phone number if by telephone) Disposition Plan: Discharge home (indicate anticipated LOS)  Time spent: 70 min  GARDNER, JARED M. [facility] Pager [phone]  If 7PM-7AM, please contact night-coverage [email or web address] Password [facility] 05/18/2013, 9:26 PM

## 2013-05-18 NOTE — ED Provider Notes (Addendum)
CSN: [id number]     Arrival date & time 05/18/13  1633 History   First MD Initiated Contact with Patient 05/18/13 1659     Chief Complaint  Patient presents with  . Chest Pain   (Consider location/radiation/quality/duration/timing/severity/associated sxs/prior Treatment) The history is provided by the patient.  [name] is a 79 y.o. female history of lung cancer in remission, CHF, PE on Coumadin here presenting with chest pain shortness of breath. She fell about 6 weeks ago. She hit the left side of her ribs. For the last 3 weeks she had some pain on the left side when she moves. She says also tender when she touches it. Also pain when she takes a deep breath. Denies shortness of breath when laying down. Denies increasing leg swelling.     Past Medical History  Diagnosis Date  . Cancer     lung ca, RUL  . CHF (congestive heart failure)   . Hypertension   . Hx pulmonary embolism 03/2011    on coumadin till 02/2011,stopped  due to gi bleed  . GERD (gastroesophageal reflux disease)   . GI bleed   . Colitis     history  . Chest pain 06/30/11    hx blood clot in lung  . Bell's palsy   . PVD (peripheral vascular disease)   . Lung cancer     metachronous stage I nscca RUL  . History of radiation therapy 5/8/10th,14th,16th,and 10/27/2010    RUL lung nscca  . Blood transfusion     during lumbar surgery  . History of chemotherapy 2006-207    carboplatin/VP16 last January 207  . Clotting disorder     DVT/ PE  . Lung cancer 04/01/12    biopsy-lingular nodule-inv carcinoma, minor squamous cell ca  . History of radiation therapy 11/12, 11/14, 04/29/2012    lingular -squamous cell carcinoma, nodule  . Paroxysmal atrial fibrillation   . On continuous oral anticoagulation    Past Surgical History  Procedure Laterality Date  . Right lower lobe lung resection  2007  . Cataract surgery      b/l  lens implants  . Abdominal hysterectomy      partial age 85  . Shoulder open rotator  cuff repair  04/2003    left  . Appendectomy    . Carpal tunnel release    . Bladder surgery    . Lumbar spine surgery      x3, L4-S1  . Right lung biopsy  2006  . Coronary angioplasty with stent placement  2001    renal stent  . Colonoscopies  2011    polyp removal    Family History  Problem Relation Age of Onset  . Breast cancer Mother   . Uterine cancer Sister   . Lung cancer Sister   . Breast cancer Maternal Grandmother   . Colon cancer Sister   . Uterine cancer Sister    History  Substance Use Topics  . Smoking status: Former Smoker -- 2.00 packs/day for 54 years    Types: Cigarettes    Quit date: 12/01/2003  . Smokeless tobacco: Never Used  . Alcohol Use: No   OB History   Grav Para Term Preterm Abortions TAB SAB Ect Mult Living                 Review of Systems  Cardiovascular: Positive for chest pain.  All other systems reviewed and are negative.    Allergies  Aspirin  Home Medications  Current Outpatient Rx  Name  Route  Sig  Dispense  Refill  . acetaminophen (TYLENOL) 650 MG CR tablet   Oral   Take 650 mg by mouth every 8 (eight) hours as needed. For pain         . alendronate (FOSAMAX) 70 MG tablet   Oral   Take 70 mg by mouth every 7 (seven) days. Take with a full glass of water on an empty stomach. On Tuesdays         . amLODipine (NORVASC) 5 MG tablet   Oral   Take 5 mg by mouth daily.         . benazepril (LOTENSIN) 10 MG tablet   Oral   Take 10 mg by mouth daily.         . cimetidine (TAGAMET) 200 MG tablet   Oral   Take 200 mg by mouth daily.           . diphenhydramine-acetaminophen (TYLENOL PM) 25-500 MG TABS   Oral   Take 2 tablets by mouth at bedtime as needed. For sleeplessness         . fenofibrate (TRICOR) 145 MG tablet   Oral   Take 145 mg by mouth every morning.          . fluconazole (DIFLUCAN) 100 MG tablet   Oral   Take 100 mg by mouth 2 (two) times daily. Saw Amy Moon nurse practiononer 03/18/13,  pt has lung infection, to take bid x 10 days         . furosemide (LASIX) 40 MG tablet   Oral   Take 20 mg by mouth every morning.          . methocarbamol (ROBAXIN) 500 MG tablet   Oral   Take 500 mg by mouth every 8 (eight) hours as needed. For muscle spasms         . metoprolol tartrate (LOPRESSOR) 25 MG tablet   Oral   Take 25 mg by mouth 2 (two) times daily.         Marland Kitchen omega-3 acid ethyl esters (LOVAZA) 1 G capsule   Oral   Take 2 g by mouth daily.         Marland Kitchen oxyCODONE-acetaminophen (PERCOCET) 5-325 MG per tablet   Oral   Take 1 tablet by mouth every 4 (four) hours as needed. For pain         . rOPINIRole (REQUIP) 0.25 MG tablet   Oral   Take 0.25 mg by mouth at bedtime.         . sennosides-docusate sodium (SENOKOT-S) 8.6-50 MG tablet   Oral   Take 1 tablet by mouth daily as needed. For constipation         . simvastatin (ZOCOR) 80 MG tablet   Oral   Take 80 mg by mouth at bedtime.          Marland Kitchen tiotropium (SPIRIVA) 18 MCG inhalation capsule   Inhalation   Place 18 mcg into inhaler and inhale daily.          Marland Kitchen warfarin (COUMADIN) 3 MG tablet   Oral   Take 1.5-3 mg by mouth See admin instructions. Take 1.5 mg on Sat and Sun, Monday-Friday pt takes 3 mg         . OXYGEN-HELIUM IN   Inhalation   Inhale 2 L into the lungs continuous.          BP 90/50  Pulse 85  Temp(Src)  98.3 F (36.8 C) (Oral)  Resp 19  Ht 5\' 4"  (1.626 m)  Wt 227 lb (102.967 kg)  BMI 38.95 kg/m2  SpO2 98% Physical Exam  Nursing note and vitals reviewed. Constitutional: She is oriented to person, place, and time.  Chronically ill, slightly uncomfortable   HENT:  Head: Normocephalic.  Mouth/Throat: Oropharynx is clear and moist.  Eyes: Conjunctivae are normal. Pupils are equal, round, and reactive to light.  Neck: Normal range of motion. Neck supple.  Cardiovascular: Normal rate, regular rhythm and normal heart sounds.   Pulmonary/Chest: Effort normal.  Dec  breath sounds L base. + reproducible L sided chest tenderness   Abdominal: Soft. Bowel sounds are normal. She exhibits no distension. There is no tenderness. There is no rebound and no guarding.  Musculoskeletal: Normal range of motion. She exhibits no edema and no tenderness.  Neurological: She is alert and oriented to person, place, and time.  Skin: Skin is warm and dry.  Psychiatric: She has a normal mood and affect. Her behavior is normal. Judgment and thought content normal.    ED Course  Procedures (including critical care time) Labs Review Labs Reviewed  CBC - Abnormal; Notable for the following:    WBC 11.3 (*)    All other components within normal limits  BASIC METABOLIC PANEL - Abnormal; Notable for the following:    Glucose, Bld 178 (*)    BUN 42 (*)    Creatinine, Ser 1.20 (*)    GFR calc non Af Amer 42 (*)    GFR calc Af Amer 48 (*)    All other components within normal limits  PROTIME-INR - Abnormal; Notable for the following:    Prothrombin Time 20.7 (*)    INR 1.84 (*)    All other components within normal limits  CULTURE, BLOOD (ROUTINE X 2)  CULTURE, BLOOD (ROUTINE X 2)  PRO B NATRIURETIC PEPTIDE  URINALYSIS, ROUTINE W REFLEX MICROSCOPIC  POCT I-STAT TROPONIN I  CG4 I-STAT (LACTIC ACID)   Imaging Review Dg Ribs Unilateral W/chest Left  05/18/2013   CLINICAL DATA:  Pain left upper ribs for 3-4 weeks since fall in bathtub  EXAM: LEFT RIBS AND CHEST - 3+ VIEW  COMPARISON:  03/31/2013 CT thorax and chest radiograph 05/13/2012  FINDINGS: Rounded area of opacity right upper lobe measures 2.4 cm. This was not present on the prior chest radiograph and prior chest CT shows mild infiltrate in the area but no evidence of mass. There is chronic elevation of the right diaphragm.  On the left, in the mid to lower lung zone there is mild interstitial opacity and there is an approximately 1 cm rounded nodule in the lateral left midlung. There is no pneumothorax or effusion.  A BB  is placed at the level of pain. The lateral left 4th rib shows mild cortical discontinuity and mild proliferative bony change. This occurs at a level nearly approximate to the level of the BB. More inferiorly and more directly at the level of the BB, the anterior left 5th rib shows very subtle cortical disc continuity.  IMPRESSION: 1. Two subacute nondisplaced left rib fractures. 2. Recent CT thorax performed March 31, 2013 showed findings most consistent with radiation therapy. No lung masses were appreciated. The 1 cm nodular opacity seen in the lateral left mid lung today may represent fibrosis, although development of a pulmonary nodule is not excluded. 3. 2.5 cm hazy right upper lobe opacity. There was mild infiltrate in this area on prior  CT scan considered most consistent with fibrosis. However, interval development of a right upper lobe lung mass or area of consolidation is not excluded. Given the bilateral parenchymal densities, nonemergent repeat CT thorax should be considered to exclude interval development of lung masses since the prior CT.   Electronically Signed   By: Esperanza Heir M.D.   On: 05/18/2013 18:09   Ct Angio Chest Pe W/cm &/or Wo Cm  05/18/2013   CLINICAL DATA:  Left lateral rib cage pain. Shortness of breath and cough for several days. History of COPD and lung cancer, CHF.  EXAM: CT ANGIOGRAPHY CHEST WITH CONTRAST  CONTRAST:  80mL OMNIPAQUE IOHEXOL 350 MG/ML SOLN  COMPARISON:  03/31/2013 and earlier  FINDINGS: The pulmonary arteries are well opacified by contrast bolus. There is no evidence for acute pulmonary embolus. The heart is enlarged. There are coronary artery calcifications. In the left mid lung, there is streaky change consistent previous radiation treatment. Scarring is identified in the right upper lobe. Within the right lower lobe, there is a 16 mm irregular nodular opacity, abutting the pleura on image number 66. This may represent an area of scarring. The density  appears more convex and masslike, raising the question of malignancy. Followup is recommended. No new consolidations are identified.  Images of the upper abdomen show a stable left adrenal nodule, consistent with benign process. Degenerative changes are seen in the spine.  Review of the MIP images confirms the above findings.  IMPRESSION: 1. Technically adequate exam showing no evidence for acute pulmonary embolus. 2. Right lower lobe spiculated nodule, suspicious for malignancy. Further evaluation is recommended.  TECHNIQUE:2: TECHNIQUE:2 Multidetector CT imaging of the chest was performed using the standard protocol during bolus administration of intravenous contrast. Multiplanar CT image reconstructions including MIPs were obtained to evaluate the vascular anatomy.   Electronically Signed   By: Rosalie Gums M.D.   On: 05/18/2013 19:36    EKG Interpretation    Date/Time:  Monday May 18 2013 16:45:45 EST Ventricular Rate:  88 PR Interval:  144 QRS Duration: 82 QT Interval:  360 QTC Calculation: 435 R Axis:   44 Text Interpretation:  Normal sinus rhythm Normal ECG No significant change since last tracing Confirmed by Jerika Wales  MD, Aurther Harlin [phone]) on 05/18/2013 4:59:45 PM            MDM  No diagnosis found. JACELYN CUEN is a 79 y.o. female here with L sided chest pain. Will get xray to r/o rib fractures. Likely atelectasis vs pneumonia. She is on coumadin so will check INR.   9:06 PM INR subtherapeutic. She was slightly hypotensive and O2 of 91%. I ordered CT angio that showed no PE. She was still hypotensive. UA possible UTI, given levaquin. I called Dr. Julian Reil, who consulted on the patient. He felt that there is no evidence of pneumonia. He wasn't concerned about the hypotension as she doesn't appear septic. He recommends holding BP meds and she has follow up with oncologist.     Richardean Canal, MD 05/18/13 2125  Richardean Canal, MD 05/18/13 2218

## 2013-05-19 ENCOUNTER — Other Ambulatory Visit: Payer: Self-pay | Admitting: Emergency Medicine

## 2013-05-20 LAB — URINE CULTURE
Colony Count: NO GROWTH
Culture: NO GROWTH

## 2013-05-21 ENCOUNTER — Other Ambulatory Visit: Payer: Self-pay | Admitting: Emergency Medicine

## 2013-05-25 LAB — CULTURE, BLOOD (ROUTINE X 2)

## 2013-05-28 ENCOUNTER — Other Ambulatory Visit: Payer: Self-pay | Admitting: Emergency Medicine

## 2013-05-29 ENCOUNTER — Telehealth: Payer: Self-pay | Admitting: Oncology

## 2013-06-01 ENCOUNTER — Ambulatory Visit (HOSPITAL_BASED_OUTPATIENT_CLINIC_OR_DEPARTMENT_OTHER): Payer: Medicare Other | Admitting: Adult Health

## 2013-06-01 ENCOUNTER — Telehealth: Payer: Self-pay | Admitting: *Deleted

## 2013-06-01 ENCOUNTER — Other Ambulatory Visit (HOSPITAL_BASED_OUTPATIENT_CLINIC_OR_DEPARTMENT_OTHER): Payer: Medicare Other

## 2013-06-01 ENCOUNTER — Encounter (INDEPENDENT_AMBULATORY_CARE_PROVIDER_SITE_OTHER): Payer: Self-pay

## 2013-06-01 ENCOUNTER — Other Ambulatory Visit: Payer: Self-pay | Admitting: Emergency Medicine

## 2013-06-01 ENCOUNTER — Encounter: Payer: Self-pay | Admitting: Adult Health

## 2013-06-01 VITALS — BP 116/67 | HR 88 | Temp 97.8°F | Resp 20 | Ht 64.0 in | Wt 228.8 lb

## 2013-06-01 DIAGNOSIS — C3410 Malignant neoplasm of upper lobe, unspecified bronchus or lung: Secondary | ICD-10-CM

## 2013-06-01 DIAGNOSIS — R918 Other nonspecific abnormal finding of lung field: Secondary | ICD-10-CM

## 2013-06-01 DIAGNOSIS — C3491 Malignant neoplasm of unspecified part of right bronchus or lung: Secondary | ICD-10-CM

## 2013-06-01 DIAGNOSIS — C3490 Malignant neoplasm of unspecified part of unspecified bronchus or lung: Secondary | ICD-10-CM

## 2013-06-01 LAB — CBC WITH DIFFERENTIAL/PLATELET
BASO%: 0.4 % (ref 0.0–2.0)
EOS%: 3.2 % (ref 0.0–7.0)
LYMPH%: 15.6 % (ref 14.0–49.7)
MCH: 29.5 pg (ref 25.1–34.0)
MCHC: 32 g/dL (ref 31.5–36.0)
MCV: 92.3 fL (ref 79.5–101.0)
MONO#: 0.6 10*3/uL (ref 0.1–0.9)
MONO%: 6.3 % (ref 0.0–14.0)
NEUT#: 7 10*3/uL — ABNORMAL HIGH (ref 1.5–6.5)
NEUT%: 74.5 % (ref 38.4–76.8)
Platelets: 193 10*3/uL (ref 145–400)
RBC: 4.2 10*6/uL (ref 3.70–5.45)
RDW: 14.2 % (ref 11.2–14.5)
WBC: 9.4 10*3/uL (ref 3.9–10.3)

## 2013-06-01 LAB — COMPREHENSIVE METABOLIC PANEL (CC13)
ALT: 13 U/L (ref 0–55)
AST: 22 U/L (ref 5–34)
Alkaline Phosphatase: 36 U/L — ABNORMAL LOW (ref 40–150)
Anion Gap: 11 mEq/L (ref 3–11)
CO2: 28 mEq/L (ref 22–29)
Calcium: 9.2 mg/dL (ref 8.4–10.4)
Potassium: 3.8 mEq/L (ref 3.5–5.1)
Sodium: 139 mEq/L (ref 136–145)
Total Bilirubin: 0.45 mg/dL (ref 0.20–1.20)
Total Protein: 6.9 g/dL (ref 6.4–8.3)

## 2013-06-01 NOTE — Progress Notes (Signed)
[facility] OFFICE PROGRESS NOTE  MOON,AMY, NP [address], [place] [address]  DIAGNOSIS: Non-small cell cancer of right lung - Plan: NM PET Image Restag (PS) Skull Base To Thigh  Chief Complaint  Patient presents with  . Follow-up  . Breast Cancer    CURRENT THERAPY: None  INTERVAL HISTORY: [name] 79 y.o. female was seen today for followup of her recurrent cancers involving the lung as outlined below. She was recently in the emergency department for a pain and cough.  It was discovered that she had cracked ribs, a chest x ray and CTA discovered right lower lobe nodules concerning for recurrence.  She also had a urinary tract infection and was given antibiotics.  I reviewed her lung cancer history with the patient and her daughter below.  She is on oxygen, and has been for 3 years.  She is on 2 liters Nasal cannula continuously.  At first she received it intermittently, but in July, she began to require it all the time.  She is doing better since she has left the emergency room.  She and her daughter are requesting a plan for the nodules of her right lower lobe.    MEDICAL HISTORY: Past Medical History  Diagnosis Date  . Cancer     lung ca, RUL  . CHF (congestive heart failure)   . Hypertension   . Hx pulmonary embolism 03/2011    on coumadin till 02/2011,stopped  due to gi bleed  . GERD (gastroesophageal reflux disease)   . GI bleed   . Colitis     history  . Chest pain 06/30/11    hx blood clot in lung  . Bell's palsy   . PVD (peripheral vascular disease)   . Lung cancer     metachronous stage I nscca RUL  . History of radiation therapy 5/8/10th,14th,16th,and 10/27/2010    RUL lung nscca  . Blood transfusion     during lumbar surgery  . History of chemotherapy 2006-207    carboplatin/VP16 last January 207  . Clotting disorder     DVT/ PE  . Lung cancer 04/01/12    biopsy-lingular nodule-inv carcinoma, minor squamous cell ca  .  History of radiation therapy 11/12, 11/14, 04/29/2012    lingular -squamous cell carcinoma, nodule  . Paroxysmal atrial fibrillation   . On continuous oral anticoagulation    ONCOLOGY HISTORY: 1. High-grade neuroendocrine lung cancer, small cell, with areas of non-small cell lung cancer, right lower lobe, with biopsy carried out on 04/16/2005, treated successfully with carboplatin and VP16 from 05/07/2005 through 07/09/2005 and then right lower lobe lobectomy on 08/09/2005.  2. Squamous cell carcinoma of the right lower lobe T1 resected at the time of surgery on 08/09/2005. All resected lymph nodes were  negative.  3. Non small cell lung cancer of the right upper lobe detected in March 2012, +NABx 09/21/10, with positive PET scan treated with stereotactic radiation 5000 cGy in 5 fractions from 10/17/2010 through 10/27/2010.  4. Lingular nodule which appears to be increasing in size. This  lesion may have been present on a CT scan of the chest going back to 08/30/2010. A PET scan carried out on 03/14/2012 showed no evidence of metastatic disease. Core needle biopsy of the lingular nodule was carried out on 04/01/2012 and showed invasive poorly differentiated carcinoma with predominant small cell carcinoma component and minor squamous cell component. Stains for p63, chromogranin and synaptophysin were diffusely positive. Cytokeratin 5/6  was positive in the squamous cell carcinoma component, but negative in the rest of the tumor. The small cell component comprised about 90% of the tumor. Slides were reviewed with Dr. Hollice Espy. The lesion was treated with stereotactic body radiotherapy (SBRT), 18 Gy per session, for  a total of 3 sessions, administered on 04/22/2012, 04/24/2012, and 04/29/2012, for a total of 54 Gy. The patient received 4 cycles of carboplatin and VP-16 with Neulasta from 05/19/2012 through 07/31/2012.   ALLERGIES:  is allergic to aspirin.  MEDICATIONS: has a current medication list  which includes the following prescription(s): acetaminophen, alendronate, amlodipine, benazepril, cimetidine, diphenhydramine-acetaminophen, fenofibrate, furosemide, levofloxacin, methocarbamol, metoprolol tartrate, omega-3 acid ethyl esters, oxycodone-acetaminophen, oxygen-helium, ropinirole, sennosides-docusate sodium, simvastatin, tiotropium, and warfarin.  SURGICAL HISTORY:  Past Surgical History  Procedure Laterality Date  . Right lower lobe lung resection  2007  . Cataract surgery      b/l  lens implants  . Abdominal hysterectomy      partial age 43  . Shoulder open rotator cuff repair  04/2003    left  . Appendectomy    . Carpal tunnel release    . Bladder surgery    . Lumbar spine surgery      x3, L4-S1  . Right lung biopsy  2006  . Coronary angioplasty with stent placement  2001    renal stent  . Colonoscopies  2011    polyp removal     REVIEW OF SYSTEMS:   A 10 point review of systems was conducted and is otherwise negative except for what is noted above.     PHYSICAL EXAMINATION: BP 116/67  Pulse 88  Temp(Src) 97.8 F (36.6 C) (Oral)  Resp 20  Ht 5\' 4"  (1.626 m)  Wt 228 lb 12.8 oz (103.783 kg)  BMI 39.25 kg/m2 EXAM LIMITED DUE TO PATIENT'S INABILITY TO GET ONTO EXAM TABLE General: Patient is a chronically ill appearing female in wheelchair HEENT: PERRLA, sclerae anicteric no conjunctival pallor, MMM Neck: supple, no palpable adenopathy Lungs: clear to auscultation bilaterally, no wheezes, rhonchi, or rales Cardiovascular: regular rate rhythm, S1, S2, no murmurs, rubs or gallops Abdomen: Soft, non-tender, non-distended, normoactive bowel sounds, no HSM Extremities: warm and well perfused, no clubbing, cyanosis, or edema Skin: No rashes or lesions Neuro: Non-focal ECOG PERFORMANCE STATUS: 2 - Symptomatic, <50% confined to bed  LABORATORY DATA: No results found for this or any previous visit (from the past 48 hour(s)).  RADIOGRAPHIC STUDIES: CT C/A/P  12/30/2012  CT CHEST, ABDOMEN AND PELVIS WITH CONTRAST  Technique: Multidetector CT imaging of the chest, abdomen and  pelvis was performed following the standard protocol during bolus  administration of intravenous contrast.  Contrast: OMNIPAQUE IOHEXOL 300 MG/ML SOLN  Comparison: 09/02/2012  CT CHEST  Findings: Very small right pleural effusion appears similar to  previous exam. Right upper lobe scarring and architectural  distortion is unchanged. Small focal area of subpleural  consolidation within the posterior medial right lower lobe is  identified, image 26/series 5. This measures 1.5 x 1.1 cm, image  26/series 5. Previously 1.4 x 0.7 cm. Areas of ground-glass  attenuation, architectural distortion and mild airspace  consolidation are noted in the left upper lobe. These may be the  sequela of external beam radiation, inflammation and/or infection.  Mild cardiac enlargement. No pericardial effusion.  No enlarged or enlarging mediastinal lymph nodes identified. The  main pulmonary artery measures 3.6 cm, image 23/series 3. This is unchanged from previous exam.  No axillary or  supraclavicular adenopathy identified. Review of  the visualized osseous structures is significant for osteopenia.  There are no aggressive lytic or sclerotic bone lesions identified.  IMPRESSION:  1. Increase and ground-glass attenuation, architectural distortion  and fibrosis within the left upper lobe. Findings are favored to  represent changes from external beam radiation.  2. There is a focal area of subpleural consolidation within the  medial right lower lobe which appears increased from previous exam.  This may also be related to changes from external beam radiation.  Advise attention on follow-up imaging.  CT ABDOMEN AND PELVIS  Findings: There is mild diffuse fatty infiltration throughout the  liver. No focal liver abnormalities noted. The gallbladder  appears normal. No biliary dilatation.  Normal appearance of the  pancreas. The spleen is normal.  The right adrenal gland is normal. There is a nodule in the left  adrenal gland which measures 1.5 cm, image number 58/series 3.  This is unchanged from previous exam.  The right kidney is normal. Several cysts noted within the left  kidney. Several of these measure less than 1 cm and are too small  to reliably characterize.  The urinary bladder appears normal. Previous hysterectomy. The  adnexal structures have a normal physiologic appearance for  patient's age. Calcified atherosclerotic disease affects the  abdominal aorta. There is no aneurysm noted. No upper abdominal  adenopathy identified. There is no pelvic or inguinal adenopathy.  The stomach is normal. The small bowel loops have a normal course  and caliber. No obstruction. Normal appearance of the colon.  The review of the visualized osseous structures shows postoperative  changes from lumbar fusion of the L4-S1.  IMPRESSION:  1. No acute findings identified within the abdomen or pelvis.  2. Stable left adrenal nodule.  3. Hepatic steatosis noted.   ASSESSMENT:  Lung cancer as noted above. Patient has new nodules in her right lower lobe of her lung as evidenced on CTA on 05/18/13.   PLAN:  1.  Discussed the results of the scans with the patient and reviewed her history of lung cancer and treatments.  I ordered a PET scan to evaluate these lung lesions better.    2.  Patient will return following her PET scan to discuss the results with Dr. Welton Flakes.    3. Advanced directives, and goals of therapy was also discussed with the patient.    All questions were answered. The patient knows to call the clinic with any problems, questions or concerns. We can certainly see the patient much sooner if necessary.  I spent 25 minutes counseling the patient face to face. The total time spent in the appointment was 30 minutes.    Illa Level, NP 06/08/2013 7:13 PM

## 2013-06-01 NOTE — Telephone Encounter (Signed)
Pt is aware that cs will call w/ appt for her PET. Pt is aware that per pof im to ask kk where to place the pt on her schedule w/ in the next 3 weeks. Pt is aware that i will call with labs and appt w/ kk....td

## 2013-06-15 ENCOUNTER — Other Ambulatory Visit: Payer: Self-pay | Admitting: Emergency Medicine

## 2013-06-16 ENCOUNTER — Other Ambulatory Visit: Payer: Self-pay | Admitting: Emergency Medicine

## 2013-06-17 ENCOUNTER — Other Ambulatory Visit: Payer: Self-pay | Admitting: Emergency Medicine

## 2013-06-23 ENCOUNTER — Telehealth: Payer: Self-pay | Admitting: *Deleted

## 2013-06-23 NOTE — Telephone Encounter (Signed)
Daughter calling to confirm appts for scans, states she missed call on Friday. Confirmed that PET scan is scheduled for 06/24/2013 and follow up with Dr Humphrey Rolls is 08/04/2013. However, if we get abnormal results on 1/15 and need to see/discuss with the patient sooner we will certainly reschedule that appt. [name] encouraged to call us at anytime for additional questions or concerns.

## 2013-06-24 ENCOUNTER — Ambulatory Visit (HOSPITAL_COMMUNITY): Payer: Medicare Other

## 2013-07-01 ENCOUNTER — Telehealth (HOSPITAL_COMMUNITY): Payer: Self-pay | Admitting: *Deleted

## 2013-07-03 ENCOUNTER — Ambulatory Visit (HOSPITAL_COMMUNITY)
Admission: RE | Admit: 2013-07-03 | Discharge: 2013-07-03 | Disposition: A | Discharge status: Home / Self Care | Payer: Medicare Other | Source: Ambulatory Visit | Attending: Adult Health | Admitting: Adult Health

## 2013-07-03 ENCOUNTER — Encounter (HOSPITAL_COMMUNITY): Payer: Self-pay

## 2013-07-03 DIAGNOSIS — C3491 Malignant neoplasm of unspecified part of right bronchus or lung: Secondary | ICD-10-CM

## 2013-07-03 DIAGNOSIS — C3490 Malignant neoplasm of unspecified part of unspecified bronchus or lung: Secondary | ICD-10-CM | POA: Insufficient documentation

## 2013-07-03 DIAGNOSIS — R091 Pleurisy: Secondary | ICD-10-CM | POA: Insufficient documentation

## 2013-07-03 DIAGNOSIS — E279 Disorder of adrenal gland, unspecified: Secondary | ICD-10-CM | POA: Insufficient documentation

## 2013-07-03 DIAGNOSIS — M948X9 Other specified disorders of cartilage, unspecified sites: Secondary | ICD-10-CM | POA: Insufficient documentation

## 2013-07-03 DIAGNOSIS — R911 Solitary pulmonary nodule: Secondary | ICD-10-CM | POA: Insufficient documentation

## 2013-07-03 LAB — GLUCOSE, CAPILLARY: GLUCOSE-CAPILLARY: 136 mg/dL — AB (ref 70–99)

## 2013-07-07 ENCOUNTER — Other Ambulatory Visit: Payer: Self-pay | Admitting: Adult Health

## 2013-07-07 DIAGNOSIS — R1900 Intra-abdominal and pelvic swelling, mass and lump, unspecified site: Secondary | ICD-10-CM

## 2013-07-07 DIAGNOSIS — C3491 Malignant neoplasm of unspecified part of right bronchus or lung: Secondary | ICD-10-CM

## 2013-07-13 ENCOUNTER — Other Ambulatory Visit: Payer: Self-pay | Admitting: Emergency Medicine

## 2013-07-13 ENCOUNTER — Other Ambulatory Visit: Payer: Self-pay | Admitting: Adult Health

## 2013-07-13 ENCOUNTER — Ambulatory Visit: Payer: Medicare Other | Admitting: Oncology

## 2013-07-13 NOTE — Progress Notes (Unsigned)
This encounter was created in error - please disregard.

## 2013-07-14 ENCOUNTER — Telehealth: Payer: Self-pay | Admitting: Oncology

## 2013-07-14 NOTE — Telephone Encounter (Signed)
per cheryl from [facility] pt needs lb prior to mri appt on 2/13. added lb for 2/13 @ 2pm and lmonvm home/cell for pt dtr adelia re lab appt foro 2/13. schedule mailed

## 2013-07-23 ENCOUNTER — Other Ambulatory Visit: Payer: Self-pay | Admitting: *Deleted

## 2013-07-23 DIAGNOSIS — C3410 Malignant neoplasm of upper lobe, unspecified bronchus or lung: Secondary | ICD-10-CM

## 2013-07-24 ENCOUNTER — Other Ambulatory Visit: Payer: Self-pay | Admitting: Adult Health

## 2013-07-24 ENCOUNTER — Ambulatory Visit (HOSPITAL_COMMUNITY)
Admission: RE | Admit: 2013-07-24 | Discharge: 2013-07-24 | Disposition: A | Discharge status: Home / Self Care | Payer: Medicare Other | Source: Ambulatory Visit | Attending: Adult Health | Admitting: Adult Health

## 2013-07-24 ENCOUNTER — Other Ambulatory Visit (HOSPITAL_BASED_OUTPATIENT_CLINIC_OR_DEPARTMENT_OTHER): Payer: Medicare Other

## 2013-07-24 DIAGNOSIS — R1900 Intra-abdominal and pelvic swelling, mass and lump, unspecified site: Secondary | ICD-10-CM

## 2013-07-24 DIAGNOSIS — C3491 Malignant neoplasm of unspecified part of right bronchus or lung: Secondary | ICD-10-CM

## 2013-07-24 DIAGNOSIS — K7689 Other specified diseases of liver: Secondary | ICD-10-CM | POA: Insufficient documentation

## 2013-07-24 DIAGNOSIS — R1909 Other intra-abdominal and pelvic swelling, mass and lump: Secondary | ICD-10-CM | POA: Insufficient documentation

## 2013-07-24 DIAGNOSIS — C3410 Malignant neoplasm of upper lobe, unspecified bronchus or lung: Secondary | ICD-10-CM

## 2013-07-24 DIAGNOSIS — K8689 Other specified diseases of pancreas: Secondary | ICD-10-CM | POA: Insufficient documentation

## 2013-07-24 DIAGNOSIS — Z85118 Personal history of other malignant neoplasm of bronchus and lung: Secondary | ICD-10-CM | POA: Insufficient documentation

## 2013-07-24 LAB — COMPREHENSIVE METABOLIC PANEL (CC13)
ALBUMIN: 3.4 g/dL — AB (ref 3.5–5.0)
ALT: 11 U/L (ref 0–55)
ANION GAP: 13 meq/L — AB (ref 3–11)
AST: 21 U/L (ref 5–34)
Alkaline Phosphatase: 36 U/L — ABNORMAL LOW (ref 40–150)
BILIRUBIN TOTAL: 0.35 mg/dL (ref 0.20–1.20)
BUN: 30.8 mg/dL — ABNORMAL HIGH (ref 7.0–26.0)
CO2: 25 meq/L (ref 22–29)
Calcium: 9.6 mg/dL (ref 8.4–10.4)
Chloride: 98 mEq/L (ref 98–109)
Creatinine: 2.2 mg/dL — ABNORMAL HIGH (ref 0.6–1.1)
Glucose: 127 mg/dl (ref 70–140)
Potassium: 3.4 mEq/L — ABNORMAL LOW (ref 3.5–5.1)
SODIUM: 136 meq/L (ref 136–145)
TOTAL PROTEIN: 7 g/dL (ref 6.4–8.3)

## 2013-07-24 LAB — CBC WITH DIFFERENTIAL/PLATELET
BASO%: 0.5 % (ref 0.0–2.0)
Basophils Absolute: 0 10*3/uL (ref 0.0–0.1)
EOS ABS: 0.3 10*3/uL (ref 0.0–0.5)
EOS%: 3.6 % (ref 0.0–7.0)
HCT: 40.1 % (ref 34.8–46.6)
HGB: 13.4 g/dL (ref 11.6–15.9)
LYMPH#: 2.3 10*3/uL (ref 0.9–3.3)
LYMPH%: 25.7 % (ref 14.0–49.7)
MCH: 30.4 pg (ref 25.1–34.0)
MCHC: 33.3 g/dL (ref 31.5–36.0)
MCV: 91.1 fL (ref 79.5–101.0)
MONO#: 0.8 10*3/uL (ref 0.1–0.9)
MONO%: 9.4 % (ref 0.0–14.0)
NEUT%: 60.8 % (ref 38.4–76.8)
NEUTROS ABS: 5.4 10*3/uL (ref 1.5–6.5)
Platelets: 270 10*3/uL (ref 145–400)
RBC: 4.4 10*6/uL (ref 3.70–5.45)
RDW: 13.8 % (ref 11.2–14.5)
WBC: 8.8 10*3/uL (ref 3.9–10.3)

## 2013-07-29 ENCOUNTER — Other Ambulatory Visit: Payer: Self-pay | Admitting: Adult Health

## 2013-07-29 ENCOUNTER — Telehealth: Payer: Self-pay | Admitting: Adult Health

## 2013-07-29 NOTE — Telephone Encounter (Signed)
Attempted to call both patients home number and cell phone number.  I called the patient to discuss her MRI abdomen results.  She needs an endoscopic ultrasound with a biopsy of the pancreas.  Before I can schedule this, I need to know where she has had previous colonoscopies or if she has ever had one.  I will attempt to call patient at later date.    Minette Headland, [place] [phone]

## 2013-07-31 ENCOUNTER — Other Ambulatory Visit: Payer: Medicare Other

## 2013-07-31 ENCOUNTER — Other Ambulatory Visit (HOSPITAL_COMMUNITY): Payer: Medicare Other

## 2013-08-04 ENCOUNTER — Ambulatory Visit: Payer: Medicare Other | Admitting: Oncology

## 2013-08-11 ENCOUNTER — Telehealth: Payer: Self-pay | Admitting: Oncology

## 2013-08-11 NOTE — Telephone Encounter (Signed)
, °

## 2013-08-19 ENCOUNTER — Encounter: Payer: Self-pay | Admitting: Adult Health

## 2013-08-19 ENCOUNTER — Ambulatory Visit (HOSPITAL_BASED_OUTPATIENT_CLINIC_OR_DEPARTMENT_OTHER): Payer: Medicare Other | Admitting: Adult Health

## 2013-08-19 ENCOUNTER — Telehealth: Payer: Self-pay | Admitting: Oncology

## 2013-08-19 VITALS — BP 141/68 | HR 81 | Temp 98.6°F | Resp 20 | Ht 64.0 in | Wt 219.7 lb

## 2013-08-19 DIAGNOSIS — K869 Disease of pancreas, unspecified: Secondary | ICD-10-CM

## 2013-08-19 DIAGNOSIS — K7689 Other specified diseases of liver: Secondary | ICD-10-CM

## 2013-08-19 DIAGNOSIS — C3410 Malignant neoplasm of upper lobe, unspecified bronchus or lung: Secondary | ICD-10-CM

## 2013-08-19 DIAGNOSIS — C3490 Malignant neoplasm of unspecified part of unspecified bronchus or lung: Secondary | ICD-10-CM

## 2013-08-19 DIAGNOSIS — R918 Other nonspecific abnormal finding of lung field: Secondary | ICD-10-CM

## 2013-08-19 NOTE — Progress Notes (Addendum)
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 - Plan: US Biopsy  Chief Complaint  Patient presents with  . Follow-up    CURRENT THERAPY: None  INTERVAL HISTORY: [name] 80 y.o. female was seen today for followup of her PET/CT results and MRI of the abdomen results.  These studies have found a mass in the pancreas along with liver lesions.  A liver biopsy and possible EUS with biopsy was recommended.  The patient is doing moderately well.  She is chronically ill.  She denies fevers, chills, nausea, vomiting, constipation, diarrhea, numbness, pain, or further concerns.   MEDICAL HISTORY: Past Medical History  Diagnosis Date  . Cancer     lung ca, RUL  . CHF (congestive heart failure)   . Hypertension   . Hx pulmonary embolism 03/2011    on coumadin till 02/2011,stopped  due to gi bleed  . GERD (gastroesophageal reflux disease)   . GI bleed   . Colitis     history  . Chest pain 06/30/11    hx blood clot in lung  . Bell's palsy   . PVD (peripheral vascular disease)   . Lung cancer     metachronous stage I nscca RUL  . History of radiation therapy 5/8/10th,14th,16th,and 10/27/2010    RUL lung nscca  . Blood transfusion     during lumbar surgery  . History of chemotherapy 2006-207    carboplatin/VP16 last January 207  . Clotting disorder     DVT/ PE  . Lung cancer 04/01/12    biopsy-lingular nodule-inv carcinoma, minor squamous cell ca  . History of radiation therapy 11/12, 11/14, 04/29/2012    lingular -squamous cell carcinoma, nodule  . Paroxysmal atrial fibrillation   . On continuous oral anticoagulation    ONCOLOGY HISTORY: 1. High-grade neuroendocrine lung cancer, small cell, with areas of non-small cell lung cancer, right lower lobe, with biopsy carried out on 04/16/2005,  treated successfully with carboplatin and VP16 from 05/07/2005 through 07/09/2005 and then right lower lobe lobectomy on 08/09/2005.  2. Squamous cell carcinoma of the right lower lobe T1 resected at the time of surgery on 08/09/2005. All resected lymph nodes were  negative.  3. Non small cell lung cancer of the right upper lobe detected in March 2012, +NABx 09/21/10, with positive PET scan treated with stereotactic radiation 5000 cGy in 5 fractions from 10/17/2010 through 10/27/2010.  4. Lingular nodule which appears to be increasing in size. This  lesion may have been present on a CT scan of the chest going back to 08/30/2010. A PET scan carried out on 03/14/2012 showed no evidence of metastatic disease. Core needle biopsy of the lingular nodule was carried out on 04/01/2012 and showed invasive poorly differentiated carcinoma with predominant small cell carcinoma component and minor squamous cell component. Stains for p63, chromogranin and synaptophysin were diffusely positive. Cytokeratin 5/6 was positive in the squamous cell carcinoma component, but negative in the rest of the tumor. The small cell component comprised about 90% of the tumor. Slides were reviewed with Dr. Lynnell Chad. The lesion was treated with stereotactic body radiotherapy (SBRT), 18 Gy per session, for  a total of 3 sessions, administered on 04/22/2012, 04/24/2012, and 04/29/2012, for a total of 54 Gy. The patient received 4 cycles of carboplatin and VP-16 with Neulasta from 05/19/2012 through 07/31/2012.  ALLERGIES:  is allergic to aspirin.  MEDICATIONS: has a current medication list which includes the following prescription(s): alendronate, amlodipine, benazepril, cimetidine, diphenhydramine-acetaminophen, fenofibrate, furosemide, methocarbamol, metoprolol tartrate, omega-3 fatty acids, oxycodone-acetaminophen, oxygen-helium, ropinirole, sennosides-docusate sodium, simvastatin, tiotropium, warfarin, and  acetaminophen.  SURGICAL HISTORY:  Past Surgical History  Procedure Laterality Date  . Right lower lobe lung resection  2007  . Cataract surgery      b/l  lens implants  . Abdominal hysterectomy      partial age 66  . Shoulder open rotator cuff repair  04/2003    left  . Appendectomy    . Carpal tunnel release    . Bladder surgery    . Lumbar spine surgery      x3, L4-S1  . Right lung biopsy  2006  . Coronary angioplasty with stent placement  2001    renal stent  . Colonoscopies  2011    polyp removal     REVIEW OF SYSTEMS:   A 10 point review of systems was conducted and is otherwise negative except for what is noted above.     PHYSICAL EXAMINATION: BP 141/68  Pulse 81  Temp(Src) 98.6 F (37 C) (Oral)  Resp 20  Ht 5\' 4"  (1.626 m)  Wt 219 lb 11.2 oz (99.655 kg)  BMI 37.69 kg/m2 EXAM LIMITED DUE TO PATIENT'S INABILITY TO GET ONTO EXAM TABLE General: Patient is a chronically ill appearing female in wheelchair HEENT: PERRLA, sclerae anicteric no conjunctival pallor, MMM Neck: supple, no palpable adenopathy Lungs: clear to auscultation bilaterally, decreased air movement, no wheezes, rhonchi, or rales Cardiovascular: regular rate rhythm, S1, S2, no murmurs, rubs or gallops Abdomen: Soft, non-tender, non-distended, normoactive bowel sounds, no HSM Extremities: warm and well perfused, no clubbing, cyanosis, or edema Skin: No rashes or lesions Neuro: Non-focal ECOG PERFORMANCE STATUS: 2 - Symptomatic, <50% confined to bed  LABORATORY DATA: No results found for this or any previous visit (from the past 48 hour(s)).  RADIOGRAPHIC STUDIES: CT C/A/P 12/30/2012  CT CHEST, ABDOMEN AND PELVIS WITH CONTRAST  Technique: Multidetector CT imaging of the chest, abdomen and  pelvis was performed following the standard protocol during bolus  administration of intravenous contrast.  Contrast: 138mL OMNIPAQUE IOHEXOL 300 MG/ML SOLN  Comparison: 09/02/2012  CT CHEST  Findings:  Very small right pleural effusion appears similar to  previous exam. Right upper lobe scarring and architectural  distortion is unchanged. Small focal area of subpleural  consolidation within the posterior medial right lower lobe is  identified, image 26/series 5. This measures 1.5 x 1.1 cm, image  26/series 5. Previously 1.4 x 0.7 cm. Areas of ground-glass  attenuation, architectural distortion and mild airspace  consolidation are noted in the left upper lobe. These may be the  sequela of external beam radiation, inflammation and/or infection.  Mild cardiac enlargement. No pericardial effusion.  No enlarged or enlarging mediastinal lymph nodes identified. The  main pulmonary artery measures 3.6 cm, image 23/series 3. This is unchanged from previous exam.  No axillary or supraclavicular adenopathy identified. Review of  the visualized osseous structures is significant for osteopenia.  There are no aggressive lytic or sclerotic bone lesions identified.  IMPRESSION:  1. Increase and ground-glass attenuation, architectural distortion  and fibrosis within the left upper lobe. Findings are favored to  represent changes from external beam radiation.  2. There is a focal area of subpleural consolidation within the  medial right lower lobe which appears increased from previous exam.  This may also  be related to changes from external beam radiation.  Advise attention on follow-up imaging.  CT ABDOMEN AND PELVIS  Findings: There is mild diffuse fatty infiltration throughout the  liver. No focal liver abnormalities noted. The gallbladder  appears normal. No biliary dilatation. Normal appearance of the  pancreas. The spleen is normal.  The right adrenal gland is normal. There is a nodule in the left  adrenal gland which measures 1.5 cm, image number 58/series 3.  This is unchanged from previous exam.  The right kidney is normal. Several cysts noted within the left  kidney. Several of these  measure less than 1 cm and are too small  to reliably characterize.  The urinary bladder appears normal. Previous hysterectomy. The  adnexal structures have a normal physiologic appearance for  patient's age. Calcified atherosclerotic disease affects the  abdominal aorta. There is no aneurysm noted. No upper abdominal  adenopathy identified. There is no pelvic or inguinal adenopathy.  The stomach is normal. The small bowel loops have a normal course  and caliber. No obstruction. Normal appearance of the colon.  The review of the visualized osseous structures shows postoperative  changes from lumbar fusion of the L4-S1.  IMPRESSION:  1. No acute findings identified within the abdomen or pelvis.  2. Stable left adrenal nodule.  3. Hepatic steatosis noted.   ASSESSMENT:  Lung cancer as noted above. Patient has new nodules in her right lower lobe of her lung as evidenced on CTA on 05/18/13. PET scan on 07/03/13 recommended f/u CT imaging with contrast to eval if enlargement of right upper lobe pleural thickening continued as it was mildly metabolic.  There was new hypermetabolic activity in the head of the pancreas.  MRI of the abdomen was done on 07/24/13 that demonstrated a soft tissue mass in the pancreatic head and innumerable T2 hyperintense liver lesions.  A biopsy of the liver was recommended.    PLAN:  1.  I discussed the results of the MRI and PET scan with the patient and her daughter.  I discussed doing a biopsy to determine what the etiology of these lesions are.  The patient would like to have a biopsy to know.  I ordered an ultrasound guided liver biopsy.    2.  The patient will return in 4 weeks to discuss pathology results and future plans.    All questions were answered. The patient knows to call the clinic with any problems, questions or concerns. We can certainly see the patient much sooner if necessary.  I spent 25 minutes counseling the patient face to face. The total time  spent in the appointment was 30 minutes.  Minette Headland, [place] [phone] Minette Headland, NP 08/21/2013 12:50 PM  ATTENDING'S ATTESTATION:  I personally reviewed patient's chart, examined patient myself, formulated the treatment plan as followed.    We discussed MRI and PET scan results today. Patient does need a biopsy of the liver lesion initially. If we can obtain it to make a diagnosis of whether she has pancreatic cancer or progressive lung cancer this would be helpful. However if she is unable to get a liver biopsy with the results are inconclusive and she will meet endoscopic ultrasound to biopsy the head of the pancreas. I explained this to the patient as well as the daughter in detail. She will be seen back after the biopsies.  Marcy Panning, MD Medical/Oncology [facility] [phone] (beeper) [phone] (Office)

## 2013-08-19 NOTE — Telephone Encounter (Signed)
, °

## 2013-08-20 ENCOUNTER — Telehealth: Payer: Self-pay | Admitting: Cardiovascular Disease

## 2013-08-20 NOTE — Telephone Encounter (Signed)
Spoke w/ Terri from [facility].  Wanted to know if pt's INR is checked through our office.  Informed no scheduled appts in our office or previous appts for INR check.  Terri stated she will check w/ pt's PCP.

## 2013-08-21 ENCOUNTER — Other Ambulatory Visit: Payer: Self-pay | Admitting: Radiology

## 2013-08-21 ENCOUNTER — Telehealth: Payer: Self-pay

## 2013-08-21 ENCOUNTER — Encounter (HOSPITAL_COMMUNITY): Payer: Self-pay | Admitting: Pharmacy Technician

## 2013-08-21 NOTE — Telephone Encounter (Signed)
Rcvd call 3/12 [facility]/Carrie [phone] - pt in for US biopsy Tues 3/17.  Radiology was not aware that pt is on coumadin - needs to be held for 4 days prior to procedure.  3/12-3/13 Kandice Robinsons PCP [place] - manages patients coumadin.  Per Manson Allan - ok for pt to hold coumadin without bridging for short term.  Moon advised pt procedure Tuesday 3/17 so coumadin will be held 3/13/3/17. Contacted Pt daughter [name] - advised her coumadin needed to be held until procedure and approval had been obtained from PCP [facility].  [name] voiced understanding.   3/13 - Let Carrie/Radiology know that patient has been instructed to hold Coumadin starting today until after her procedure.

## 2013-08-25 ENCOUNTER — Ambulatory Visit (HOSPITAL_COMMUNITY)
Admission: RE | Admit: 2013-08-25 | Discharge: 2013-08-25 | Disposition: A | Discharge status: Home / Self Care | Payer: Medicare Other | Source: Ambulatory Visit | Attending: Adult Health | Admitting: Adult Health

## 2013-08-25 ENCOUNTER — Encounter (HOSPITAL_COMMUNITY): Payer: Self-pay

## 2013-08-25 DIAGNOSIS — Z5309 Procedure and treatment not carried out because of other contraindication: Secondary | ICD-10-CM | POA: Insufficient documentation

## 2013-08-25 DIAGNOSIS — K7689 Other specified diseases of liver: Secondary | ICD-10-CM | POA: Insufficient documentation

## 2013-08-25 DIAGNOSIS — R791 Abnormal coagulation profile: Secondary | ICD-10-CM | POA: Insufficient documentation

## 2013-08-25 DIAGNOSIS — Z7901 Long term (current) use of anticoagulants: Secondary | ICD-10-CM | POA: Insufficient documentation

## 2013-08-25 DIAGNOSIS — C3490 Malignant neoplasm of unspecified part of unspecified bronchus or lung: Secondary | ICD-10-CM | POA: Insufficient documentation

## 2013-08-25 DIAGNOSIS — Z01812 Encounter for preprocedural laboratory examination: Secondary | ICD-10-CM | POA: Insufficient documentation

## 2013-08-25 HISTORY — DX: Type 2 diabetes mellitus without complications: E11.9

## 2013-08-25 LAB — APTT: aPTT: 48 seconds — ABNORMAL HIGH (ref 24–37)

## 2013-08-25 LAB — CBC
HEMATOCRIT: 37.7 % (ref 36.0–46.0)
Hemoglobin: 12.5 g/dL (ref 12.0–15.0)
MCH: 30.3 pg (ref 26.0–34.0)
MCHC: 33.2 g/dL (ref 30.0–36.0)
MCV: 91.3 fL (ref 78.0–100.0)
Platelets: 195 10*3/uL (ref 150–400)
RBC: 4.13 MIL/uL (ref 3.87–5.11)
RDW: 14.1 % (ref 11.5–15.5)
WBC: 11.2 10*3/uL — ABNORMAL HIGH (ref 4.0–10.5)

## 2013-08-25 LAB — GLUCOSE, CAPILLARY: Glucose-Capillary: 136 mg/dL — ABNORMAL HIGH (ref 70–99)

## 2013-08-25 LAB — PROTIME-INR
INR: 2.43 — ABNORMAL HIGH (ref 0.00–1.49)
Prothrombin Time: 25.6 seconds — ABNORMAL HIGH (ref 11.6–15.2)

## 2013-08-25 MED ORDER — SODIUM CHLORIDE 0.9 % IV SOLN
Freq: Once | INTRAVENOUS | Status: AC
  Administered 2013-08-25: 12:00:00 via INTRAVENOUS

## 2013-08-25 NOTE — Progress Notes (Signed)
Patient's procedure canceled. Rescheduled for Friday at 1100. Verbalizes understanding of arrival time. Instructed patient to follow pre-procedure instructions given by radiology. Verbalizes understanding.

## 2013-08-25 NOTE — Progress Notes (Signed)
Patient ID: [name], female   DOB: 05/17/1931, 80 y.o.   MRN: [id number] Pt's previously scheduled liver lesion biopsy for today has been postponed until 3/20 secondary to elevated PT/INR  (25.6/2.43). Pt will cont to hold coumadin. Dr. Humphrey Rolls notified.

## 2013-08-26 ENCOUNTER — Other Ambulatory Visit: Payer: Self-pay | Admitting: Radiology

## 2013-08-28 ENCOUNTER — Ambulatory Visit (HOSPITAL_COMMUNITY)
Admission: RE | Admit: 2013-08-28 | Discharge: 2013-08-28 | Disposition: A | Discharge status: Home / Self Care | Payer: Medicare Other | Source: Ambulatory Visit | Attending: Adult Health | Admitting: Adult Health

## 2013-08-28 ENCOUNTER — Encounter (HOSPITAL_COMMUNITY): Payer: Self-pay

## 2013-08-28 DIAGNOSIS — E119 Type 2 diabetes mellitus without complications: Secondary | ICD-10-CM | POA: Insufficient documentation

## 2013-08-28 DIAGNOSIS — I509 Heart failure, unspecified: Secondary | ICD-10-CM | POA: Insufficient documentation

## 2013-08-28 DIAGNOSIS — Z9221 Personal history of antineoplastic chemotherapy: Secondary | ICD-10-CM | POA: Insufficient documentation

## 2013-08-28 DIAGNOSIS — K7689 Other specified diseases of liver: Secondary | ICD-10-CM | POA: Insufficient documentation

## 2013-08-28 DIAGNOSIS — Z7901 Long term (current) use of anticoagulants: Secondary | ICD-10-CM | POA: Insufficient documentation

## 2013-08-28 DIAGNOSIS — K219 Gastro-esophageal reflux disease without esophagitis: Secondary | ICD-10-CM | POA: Insufficient documentation

## 2013-08-28 DIAGNOSIS — Z923 Personal history of irradiation: Secondary | ICD-10-CM | POA: Insufficient documentation

## 2013-08-28 DIAGNOSIS — I4891 Unspecified atrial fibrillation: Secondary | ICD-10-CM | POA: Insufficient documentation

## 2013-08-28 DIAGNOSIS — Z9089 Acquired absence of other organs: Secondary | ICD-10-CM | POA: Insufficient documentation

## 2013-08-28 DIAGNOSIS — Z86711 Personal history of pulmonary embolism: Secondary | ICD-10-CM | POA: Insufficient documentation

## 2013-08-28 DIAGNOSIS — Z90710 Acquired absence of both cervix and uterus: Secondary | ICD-10-CM | POA: Insufficient documentation

## 2013-08-28 DIAGNOSIS — Z79899 Other long term (current) drug therapy: Secondary | ICD-10-CM | POA: Insufficient documentation

## 2013-08-28 DIAGNOSIS — Z85118 Personal history of other malignant neoplasm of bronchus and lung: Secondary | ICD-10-CM | POA: Insufficient documentation

## 2013-08-28 DIAGNOSIS — I739 Peripheral vascular disease, unspecified: Secondary | ICD-10-CM | POA: Insufficient documentation

## 2013-08-28 DIAGNOSIS — I10 Essential (primary) hypertension: Secondary | ICD-10-CM | POA: Insufficient documentation

## 2013-08-28 LAB — APTT: APTT: 32 s (ref 24–37)

## 2013-08-28 LAB — PROTIME-INR
INR: 1.17 (ref 0.00–1.49)
Prothrombin Time: 14.7 seconds (ref 11.6–15.2)

## 2013-08-28 LAB — CBC
HEMATOCRIT: 37.4 % (ref 36.0–46.0)
Hemoglobin: 12.5 g/dL (ref 12.0–15.0)
MCH: 30.3 pg (ref 26.0–34.0)
MCHC: 33.4 g/dL (ref 30.0–36.0)
MCV: 90.8 fL (ref 78.0–100.0)
Platelets: 206 10*3/uL (ref 150–400)
RBC: 4.12 MIL/uL (ref 3.87–5.11)
RDW: 13.6 % (ref 11.5–15.5)
WBC: 8.1 10*3/uL (ref 4.0–10.5)

## 2013-08-28 LAB — GLUCOSE, CAPILLARY: Glucose-Capillary: 105 mg/dL — ABNORMAL HIGH (ref 70–99)

## 2013-08-28 MED ORDER — SODIUM CHLORIDE 0.9 % IV SOLN
Freq: Once | INTRAVENOUS | Status: AC
  Administered 2013-08-28: 12:00:00 via INTRAVENOUS

## 2013-08-28 MED ORDER — FENTANYL CITRATE 0.05 MG/ML IJ SOLN
INTRAMUSCULAR | Status: AC | PRN
  Administered 2013-08-28 (×2): 50 ug via INTRAVENOUS

## 2013-08-28 MED ORDER — FENTANYL CITRATE 0.05 MG/ML IJ SOLN
INTRAMUSCULAR | Status: DC
Start: 2013-08-28 — End: 2013-08-29
  Filled 2013-08-28: qty 6

## 2013-08-28 MED ORDER — HYDROCODONE-ACETAMINOPHEN 5-325 MG PO TABS
1.0000 | ORAL_TABLET | ORAL | Status: DC | PRN
  Filled 2013-08-28: qty 2

## 2013-08-28 MED ORDER — MIDAZOLAM HCL 2 MG/2ML IJ SOLN
INTRAMUSCULAR | Status: AC | PRN
  Administered 2013-08-28 (×2): 1 mg via INTRAVENOUS

## 2013-08-28 MED ORDER — MIDAZOLAM HCL 2 MG/2ML IJ SOLN
INTRAMUSCULAR | Status: AC
  Filled 2013-08-28: qty 6

## 2013-08-28 MED ORDER — SODIUM CHLORIDE 0.9 % IV BOLUS (SEPSIS)
INTRAVENOUS | Status: AC | PRN
  Administered 2013-08-28: 400 mL via INTRAVENOUS

## 2013-08-28 NOTE — Procedures (Signed)
Liver Bx R lobe 18 g core times 4 No comp

## 2013-08-28 NOTE — H&P (Signed)
Chief Complaint: "I am here for a biopsy." Referring Physician: Charlestine Massed, NP HPI: [name] is an 80 y.o. 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 She has underwent treatment and RLL resection. The patient now presents with hypermetabolic head of the pancreas on recent PET scan. Follow-up MRI revealed soft tissue mass centered at head of the pancreas and T2 hyperintense liver lesions. IR received request for image guided liver lesion biopsy. She denies any chest pain, change in chronic shortness of breath or palpitations. She denies any active signs of bleeding or excessive bruising, she is on chronic coumadin for DVT/PE and has held this for today's procedure. She denies any recent fever or chills. The patient denies any history of sleep apnea, but does use chronic oxygen at 2L. She has previously tolerated sedation without complications.   Past Medical History:  Past Medical History  Diagnosis Date  . Cancer     lung ca, RUL  . CHF (congestive heart failure)   . Hypertension   . Hx pulmonary embolism 03/2011    on coumadin till 02/2011,stopped  due to gi bleed  . GERD (gastroesophageal reflux disease)   . GI bleed   . Colitis     history  . Chest pain 06/30/11    hx blood clot in lung  . Bell's palsy   . PVD (peripheral vascular disease)   . Lung cancer     metachronous stage I nscca RUL  . History of radiation therapy 5/8/10th,14th,16th,and 10/27/2010    RUL lung nscca  . Blood transfusion     during lumbar surgery  . History of chemotherapy 2006-207    carboplatin/VP16 last January 207  . Clotting disorder     DVT/ PE  . Lung cancer 04/01/12    biopsy-lingular nodule-inv carcinoma, minor squamous cell ca  . History of radiation therapy 11/12, 11/14, 04/29/2012    lingular -squamous cell carcinoma, nodule  . Paroxysmal atrial fibrillation   . On continuous oral anticoagulation   . Diabetes  mellitus without complication     patient states "diet controlled."    Past Surgical History:  Past Surgical History  Procedure Laterality Date  . Right lower lobe lung resection  2007  . Cataract surgery      b/l  lens implants  . Abdominal hysterectomy      partial age 59  . Shoulder open rotator cuff repair  04/2003    left  . Appendectomy    . Carpal tunnel release    . Bladder surgery    . Lumbar spine surgery      x3, L4-S1  . Right lung biopsy  2006  . Coronary angioplasty with stent placement  2001    renal stent  . Colonoscopies  2011    polyp removal     Family History:  Family History  Problem Relation Age of Onset  . Breast cancer Mother   . Uterine cancer Sister   . Lung cancer Sister   . Breast cancer Maternal Grandmother   . Colon cancer Sister   . Uterine cancer Sister     Social History:  reports that she quit smoking about 9 years ago. Her smoking use included Cigarettes. She has a 108 pack-year smoking history. She has never used smokeless tobacco. She reports that she does not drink alcohol or use illicit drugs.  Allergies:  Allergies  Allergen Reactions  . Aspirin Other (See Comments)  Stomach pain    Medications:   Medication List    ASK your doctor about these medications       acetaminophen 650 MG CR tablet  Commonly known as:  TYLENOL  Take 650 mg by mouth every 8 (eight) hours as needed. For pain     alendronate 70 MG tablet  Commonly known as:  FOSAMAX  Take 70 mg by mouth every 7 (seven) days. Take with a full glass of water on an empty stomach. On Tuesdays     amLODipine 5 MG tablet  Commonly known as:  NORVASC  Take 5 mg by mouth every morning.     benazepril 10 MG tablet  Commonly known as:  LOTENSIN  Take 10 mg by mouth every morning.     cimetidine 200 MG tablet  Commonly known as:  TAGAMET  Take 200 mg by mouth daily.     diphenhydramine-acetaminophen 25-500 MG Tabs  Commonly known as:  TYLENOL PM  Take 1-2  tablets by mouth at bedtime as needed (pain/sleep). For sleeplessness     fenofibrate 145 MG tablet  Commonly known as:  TRICOR  Take 145 mg by mouth every morning.     furosemide 40 MG tablet  Commonly known as:  LASIX  Take 40 mg by mouth every morning.     methocarbamol 500 MG tablet  Commonly known as:  ROBAXIN  Take 500 mg by mouth every 8 (eight) hours as needed. For muscle spasms     metoprolol tartrate 25 MG tablet  Commonly known as:  LOPRESSOR  Take 25 mg by mouth 2 (two) times daily.     omega-3 acid ethyl esters 1 G capsule  Commonly known as:  LOVAZA  Take 1 g by mouth daily.     oxyCODONE-acetaminophen 5-325 MG per tablet  Commonly known as:  PERCOCET/ROXICET  Take 1 tablet by mouth every 4 (four) hours as needed. For pain     rOPINIRole 0.25 MG tablet  Commonly known as:  REQUIP  Take 0.25 mg by mouth at bedtime.     sennosides-docusate sodium 8.6-50 MG tablet  Commonly known as:  SENOKOT-S  Take 1 tablet by mouth daily as needed. For constipation     simvastatin 80 MG tablet  Commonly known as:  ZOCOR  Take 80 mg by mouth at bedtime.     tiotropium 18 MCG inhalation capsule  Commonly known as:  SPIRIVA  Place 18 mcg into inhaler and inhale daily.     warfarin 3 MG tablet  Commonly known as:  COUMADIN  Take 1.5-3 mg by mouth See admin instructions. Take 1.5 mg on Sat and Sun, Monday-Friday pt takes 3 mg       Please HPI for pertinent positives, otherwise complete 10 system ROS negative.  Physical Exam: BP 117/60  Pulse 67  Temp(Src) 98.3 F (36.8 C) (Oral)  Resp 20  SpO2 96% on 2L There is no weight on file to calculate BMI.  General Appearance:  Alert, cooperative, no distress  Head:  Normocephalic, without obvious abnormality, atraumatic  Neck: Supple, symmetrical, trachea midline  Lungs:   Clear to auscultation bilaterally, no w/r/r, respirations unlabored without use of accessory muscles.  Chest Wall:  No tenderness or deformity   Heart:  Regular rate and rhythm, S1, S2 normal, no murmur, rub or gallop.  Abdomen:   Soft, non-tender, non distended.  Extremities: Extremities normal, atraumatic, no cyanosis or edema  Neurologic: Normal affect, no gross deficits.   Results  for orders placed during the hospital encounter of 08/28/13 (from the past 48 hour(s))  APTT     Status: None   Collection Time    08/28/13 11:25 AM      Result Value Ref Range   aPTT 32  24 - 37 seconds  CBC     Status: None   Collection Time    08/28/13 11:25 AM      Result Value Ref Range   WBC 8.1  4.0 - 10.5 K/uL   RBC 4.12  3.87 - 5.11 MIL/uL   Hemoglobin 12.5  12.0 - 15.0 g/dL   HCT 37.4  36.0 - 46.0 %   MCV 90.8  78.0 - 100.0 fL   MCH 30.3  26.0 - 34.0 pg   MCHC 33.4  30.0 - 36.0 g/dL   RDW 13.6  11.5 - 15.5 %   Platelets 206  150 - 400 K/uL  PROTIME-INR     Status: None   Collection Time    08/28/13 11:25 AM      Result Value Ref Range   Prothrombin Time 14.7  11.6 - 15.2 seconds   INR 1.17  0.00 - 1.49   No results found.  Assessment/Plan High-grade neuroendocrine lung cancer, small cell with areas of non-small cell lung cancer s/p RLL resection. PET with hypermetabolic activity in head of the pancreas MRI T2 hyperintense liver lesions and soft tissue mass centered at head of the pancreas. Request for image guided liver lesion biopsy. Patient has been NPO, coumadin held, INR 1.17, labs and images reviewed. Risks and Benefits discussed with the patient. All of the patient's questions were answered, patient is agreeable to proceed. Consent signed and in chart. Chronic oxygen use 2L History of DVT/PE on chronic coumadin, held for procedure.  Diastolic CHF.    Tsosie Billing D PA-C 08/28/2013, 12:20 PM

## 2013-08-28 NOTE — Discharge Instructions (Signed)
Conscious Sedation, Adult, Care After Refer to this sheet in the next few weeks. These instructions provide you with information on caring for yourself after your procedure. Your health care provider may also give you more specific instructions. Your treatment has been planned according to current medical practices, but problems sometimes occur. Call your health care provider if you have any problems or questions after your procedure. WHAT TO EXPECT AFTER THE PROCEDURE  After your procedure:  You may feel sleepy, clumsy, and have poor balance for several hours.  Vomiting may occur if you eat too soon after the procedure. HOME CARE INSTRUCTIONS  Do not participate in any activities where you could become injured for at least 24 hours. Do not:  Drive.  Swim.  Ride a bicycle.  Operate heavy machinery.  Cook.  Use power tools.  Climb ladders.  Work from a high place.  Do not make important decisions or sign legal documents until you are improved.  If you vomit, drink water, juice, or soup when you can drink without vomiting. Make sure you have little or no nausea before eating solid foods.  Only take over-the-counter or prescription medicines for pain, discomfort, or fever as directed by your health care provider.  Make sure you and your family fully understand everything about the medicines given to you, including what side effects may occur.  You should not drink alcohol, take sleeping pills, or take medicines that cause drowsiness for at least 24 hours.  If you smoke, do not smoke without supervision.  If you are feeling better, you may resume normal activities 24 hours after you were sedated.  Keep all appointments with your health care provider. SEEK MEDICAL CARE IF:  Your skin is pale or bluish in color.  You continue to feel nauseous or vomit.  Your pain is getting worse and is not helped by medicine.  You have bleeding or swelling.  You are still sleepy or  feeling clumsy after 24 hours. SEEK IMMEDIATE MEDICAL CARE IF:  You develop a rash.  You have difficulty breathing.  You develop any type of allergic problem.  You have a fever. MAKE SURE YOU:  Understand these instructions.  Will watch your condition.  Will get help right away if you are not doing well or get worse. Document Released: 03/18/2013 Document Reviewed: 01/02/2013 [facility] Patient Information 2014 [place], [place].  Liver Biopsy Care After Refer to this sheet in the next few weeks. These discharge instructions provide you with general information on caring for yourself after you leave the hospital. Your caregiver may also give you specific instructions. Your treatment has been planned according to the most current medical practices available, but unavoidable complications sometimes occur. If you have any problems or questions after discharge, please call your caregiver. HOME CARE INSTRUCTIONS   You should rest for 1 to 2 days or as instructed.  If you go home the same day as your procedure (outpatient), have a responsible adult take you home and stay with you overnight.  Do not lift more than 5 pounds or play contact sports for 2 weeks.  Do not drive for 24 hours after this test.  Do not take medicine containing aspirin or drink alcohol for 1 week after this test.  Change bandages (dressings) as directed.  Only take over-the-counter or prescription medicines for pain, discomfort, or fever as directed by your caregiver. OBTAINING YOUR TEST RESULTS Not all test results are available during your visit. If your test results are not back  during the visit, make an appointment with your caregiver to find out the results. Do not assume everything is normal if you have not heard from your caregiver or the medical facility. It is important for you to follow up on all of your test results. SEEK MEDICAL CARE IF:   You have increased bleeding (more than a small spot) from the  biopsy site.  You have redness, swelling, or increasing pain in the biopsy site.  You have an oral temperature above 102 F (38.9 C). SEEK IMMEDIATE MEDICAL CARE IF:   You develop swelling or pain in the belly (abdomen).  You develop a rash.  You have difficulty breathing, feel short of breath, or feel faint.  You develop any reaction or side effects to medicines given. MAKE SURE YOU:   Understand these instructions.  Will watch your condition.  Will get help right away if you are not doing well or get worse. Document Released: 12/15/2004 Document Revised: 08/20/2011 Document Reviewed: 01/08/2008 [facility] Patient Information 2014 [place].

## 2013-09-03 ENCOUNTER — Telehealth: Payer: Self-pay | Admitting: Adult Health

## 2013-09-03 ENCOUNTER — Telehealth: Payer: Self-pay

## 2013-09-03 NOTE — Telephone Encounter (Signed)
Returned call of patient's daughter about pathology results of the liver consistent with metastatic poorly differentiated neuroendocrine carcinoma.  I informed the patient that we would be moving up her appointment to next week to discuss treatment plans.  I encouraged the daughter to talk to her mother about her wishes regarding more treatment and quality of life.  Daughter verbalized understanding and agreed.  Daughter thanked me for my time on the phone with her.    Minette [place], [place] [phone]

## 2013-09-03 NOTE — Telephone Encounter (Signed)
Pt daughter [name] called requesting status of liver biopsy.  Would like call back even if results are not in yet.  Routed to [facility]

## 2013-09-15 ENCOUNTER — Other Ambulatory Visit: Payer: Self-pay

## 2013-09-15 DIAGNOSIS — C3410 Malignant neoplasm of upper lobe, unspecified bronchus or lung: Secondary | ICD-10-CM

## 2013-09-16 ENCOUNTER — Other Ambulatory Visit: Payer: Medicare Other

## 2013-09-16 ENCOUNTER — Ambulatory Visit: Payer: Medicare Other | Admitting: Adult Health

## 2013-09-17 ENCOUNTER — Telehealth: Payer: Self-pay | Admitting: Adult Health

## 2013-09-17 ENCOUNTER — Ambulatory Visit
Admission: RE | Admit: 2013-09-17 | Discharge: 2013-09-17 | Disposition: A | Discharge status: Home / Self Care | Payer: Medicare Other | Source: Ambulatory Visit | Attending: Radiation Oncology | Admitting: Radiation Oncology

## 2013-09-17 ENCOUNTER — Encounter: Payer: Self-pay | Admitting: Radiation Oncology

## 2013-09-17 VITALS — BP 119/48 | HR 79 | Temp 97.3°F | Resp 20 | Ht 64.0 in | Wt 216.0 lb

## 2013-09-17 DIAGNOSIS — C3410 Malignant neoplasm of upper lobe, unspecified bronchus or lung: Secondary | ICD-10-CM

## 2013-09-17 DIAGNOSIS — C3490 Malignant neoplasm of unspecified part of unspecified bronchus or lung: Secondary | ICD-10-CM

## 2013-09-17 NOTE — Progress Notes (Signed)
[name] here today s/p radiation therapy to  lung  which completed on 11/13.  She is traveling by wheelchair and using O2 at 2 liter via [place].  SOB when weighing and O2 sat 92% afterwards.  She is c/o of lumbar pain, today, related to degenerative disease and lumbar disc issues, and grades this as a level 10/10. She is also c/o intermittent pain in her right arm with decreased mobility when pain occurs( none presently).  She c/o intermittent numbness in her right, lateral thigh associated with itching and whenever she scratches this area she "can't feel it."

## 2013-09-17 NOTE — Telephone Encounter (Signed)
, °

## 2013-09-17 NOTE — Progress Notes (Signed)
Radiation Oncology         [phone] ________________________________  Name: [name] MRN: [id number]  Date: 09/17/2013  DOB: 23-Mar-1931  Follow-Up Visit Note  CC: MOON,AMY, NP  Moon, Amy, NP  Diagnosis:   80 yo woman with multiple metachronous lung cancers s/p  1. Curative SBRT for clincal IA NSCLC of the right upper lobe treated to 50 Gy in 5 fractions in May 2012  2. 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  In December, she had a chest CT showing a new right lower lung nodule which was suspicious. This prompted a PET/CT on 07/03/2013. The PET/CT actually revealed hypermetabolism with the lung nodule in addition to a new pancreatic head lesion. Subsequent abdominal MRI on 07/24/2013 revealed innumerable lesions in the liver suspicious for metastatic disease. The patient underwent ultrasound-guided liver biopsy on 08/28/2013 revealing metastatic poorly differentiated neuroendocrine carcinoma. She was scheduled to be seen in medical oncology yesterday to discuss the findings but did not present for her appointment, because when they called to confirm the appointment, they were told they would see medical oncology today at the time of the rad-onc appointment.                              ALLERGIES:  is allergic to aspirin.  Meds: Current Outpatient Prescriptions  Medication Sig Dispense Refill  . acetaminophen (TYLENOL) 650 MG CR tablet Take 650 mg by mouth every 8 (eight) hours as needed. For pain      . alendronate (FOSAMAX) 70 MG tablet Take 70 mg by mouth every 7 (seven) days. Take with a full glass of water on an empty stomach. On Tuesdays      . amLODipine (NORVASC) 5 MG tablet Take 5 mg by mouth every morning.       . benazepril (LOTENSIN) 10  MG tablet Take 10 mg by mouth every morning.       . cimetidine (TAGAMET) 200 MG tablet Take 200 mg by mouth daily.        . diphenhydramine-acetaminophen (TYLENOL PM) 25-500 MG TABS Take 1-2 tablets by mouth at bedtime as needed (pain/sleep). For sleeplessness      . fenofibrate (TRICOR) 145 MG tablet Take 145 mg by mouth every morning.       . furosemide (LASIX) 40 MG tablet Take 40 mg by mouth every morning.       . methocarbamol (ROBAXIN) 500 MG tablet Take 500 mg by mouth every 8 (eight) hours as needed. For muscle spasms      . metoprolol tartrate (LOPRESSOR) 25 MG tablet Take 25 mg by mouth 2 (two) times daily.      Marland Kitchen omega-3 acid ethyl esters (LOVAZA) 1 G capsule Take 1 g by mouth daily.      Marland Kitchen oxyCODONE-acetaminophen (PERCOCET) 5-325 MG per tablet Take 1 tablet by mouth every 4 (four) hours as needed. For pain      . pantoprazole (PROTONIX) 40 MG tablet Take 40 mg by mouth daily.      . sennosides-docusate sodium (SENOKOT-S) 8.6-50 MG tablet Take 1 tablet by mouth daily as needed. For constipation      . simvastatin (ZOCOR) 80 MG tablet Take  80 mg by mouth at bedtime.       Marland Kitchen tiotropium (SPIRIVA) 18 MCG inhalation capsule Place 18 mcg into inhaler and inhale daily.       Marland Kitchen warfarin (COUMADIN) 3 MG tablet Take 1.5-3 mg by mouth See admin instructions. Take 1.5 mg on Sat and Sun, Monday-Friday pt takes 3 mg      . rOPINIRole (REQUIP) 0.25 MG tablet Take 0.25 mg by mouth at bedtime.       No current facility-administered medications for this encounter.    Physical Findings: The patient is in no acute distress. Patient is alert and oriented.  height is 5\' 4"  (1.626 m) and weight is 216 lb (97.977 kg). Her temperature is 97.3 F (36.3 C). Her blood pressure is 119/48 and her pulse is 79. Her respiration is 20 and oxygen saturation is 92%. .  No significant changes.  Lab Findings: Lab Results  Component Value Date   WBC 8.1 08/28/2013   HGB 12.5 08/28/2013   HCT 37.4 08/28/2013   MCV  90.8 08/28/2013   PLT 206 08/28/2013    @LASTCHEM @  Radiographic Findings: Chest CT 05/18/13 CLINICAL DATA: Left lateral rib cage pain. Shortness of breath and  cough for several days. History of COPD and lung cancer, CHF.  EXAM:  CT ANGIOGRAPHY CHEST WITH CONTRAST  CONTRAST: 60mL OMNIPAQUE IOHEXOL 350 MG/ML SOLN  COMPARISON: 03/31/2013 and earlier  FINDINGS:  The pulmonary arteries are well opacified by contrast bolus. There  is no evidence for acute pulmonary embolus. The heart is enlarged.  There are coronary artery calcifications. In the left mid lung,  there is streaky change consistent previous radiation treatment.  Scarring is identified in the right upper lobe. Within the right  lower lobe, there is a 16 mm irregular nodular opacity, abutting the  pleura on image number 66. This may represent an area of scarring.  The density appears more convex and masslike, raising the question  of malignancy. Followup is recommended. No new consolidations are  identified.  Images of the upper abdomen show a stable left adrenal nodule,  consistent with benign process. Degenerative changes are seen in the  spine.  Review of the MIP images confirms the above findings.  IMPRESSION:  1. Technically adequate exam showing no evidence for acute pulmonary  embolus.  2. Right lower lobe spiculated nodule, suspicious for malignancy.  Further evaluation is recommended.   US Biopsy  08/28/2013   CLINICAL DATA:  Liver lesions  EXAM: ULTRASOUND-GUIDED BIOPSY OF A LIVER LESION.  CORE.  MEDICATIONS AND MEDICAL HISTORY: Versed two mg, Fentanyl 100 mcg.  Additional Medications: None.  ANESTHESIA/SEDATION: Moderate sedation time: 9 minutes  PROCEDURE: The procedure, risks, benefits, and alternatives were explained to the patient. Questions regarding the procedure were encouraged and answered. The patient understands and consents to the procedure.  The right upper quadrant was prepped with Betadine in a  sterile fashion, and a sterile drape was applied covering the operative field. A sterile gown and sterile gloves were used for the procedure.  Under sonographic guidance, a 22 gauge needle was inserted into a lesion within the right lobe of the liver. Four 18 gauge core biopsies of the lesion were obtained. The guide needle was removed. Final imaging was performed.  Patient tolerated the procedure well without complication. Vital sign monitoring by nursing staff during the procedure will continue as patient is in the special procedures unit for post procedure observation.  FINDINGS: The images document guide needle placement within  the right lobe liver lesion. Post biopsy images demonstrate no evidence of a hemorrhage.  IMPRESSION: Successful ultrasound-guided core biopsy of a right lobe liver lesion.   Electronically Signed   By: Maryclare Bean M.D.   On: 08/28/2013 17:33    Impression:  The patient has developed distant metastatic neuroendocrine carcinoma.  Plan:  To see med-onc next week.  She is interested in chemo.  She may need updated re-staging brain MRI.  _____________________________________  Sheral Apley. Tammi Klippel, M.D.

## 2013-09-23 ENCOUNTER — Other Ambulatory Visit: Payer: Medicare Other

## 2013-09-23 ENCOUNTER — Telehealth: Payer: Self-pay | Admitting: Adult Health

## 2013-09-23 ENCOUNTER — Ambulatory Visit: Payer: Medicare Other | Admitting: Adult Health

## 2013-09-23 NOTE — Telephone Encounter (Signed)
, °

## 2013-09-29 ENCOUNTER — Telehealth: Payer: Self-pay | Admitting: Oncology

## 2013-09-29 NOTE — Telephone Encounter (Signed)
kk out - moved 4/22 appt from [facility] to [facility] 4/24. d/t/GM per LC/GM. s/w dtr she is aware and has new appt for lb/GM 4/24 @ 1:30pm.

## 2013-09-30 ENCOUNTER — Ambulatory Visit: Payer: Medicare Other | Admitting: Adult Health

## 2013-09-30 ENCOUNTER — Other Ambulatory Visit: Payer: Medicare Other

## 2013-10-02 ENCOUNTER — Other Ambulatory Visit (HOSPITAL_BASED_OUTPATIENT_CLINIC_OR_DEPARTMENT_OTHER): Payer: Medicare Other

## 2013-10-02 ENCOUNTER — Encounter: Payer: Self-pay | Admitting: *Deleted

## 2013-10-02 ENCOUNTER — Ambulatory Visit (HOSPITAL_BASED_OUTPATIENT_CLINIC_OR_DEPARTMENT_OTHER): Payer: Medicare Other | Admitting: Oncology

## 2013-10-02 VITALS — BP 91/58 | HR 94 | Temp 98.4°F | Resp 17 | Ht 64.0 in | Wt 213.2 lb

## 2013-10-02 DIAGNOSIS — C3410 Malignant neoplasm of upper lobe, unspecified bronchus or lung: Secondary | ICD-10-CM

## 2013-10-02 DIAGNOSIS — C3490 Malignant neoplasm of unspecified part of unspecified bronchus or lung: Secondary | ICD-10-CM

## 2013-10-02 DIAGNOSIS — Z85118 Personal history of other malignant neoplasm of bronchus and lung: Secondary | ICD-10-CM

## 2013-10-02 DIAGNOSIS — C787 Secondary malignant neoplasm of liver and intrahepatic bile duct: Secondary | ICD-10-CM

## 2013-10-02 DIAGNOSIS — C7A090 Malignant carcinoid tumor of the bronchus and lung: Secondary | ICD-10-CM

## 2013-10-02 LAB — COMPREHENSIVE METABOLIC PANEL (CC13)
ALBUMIN: 3.2 g/dL — AB (ref 3.5–5.0)
ALK PHOS: 49 U/L (ref 40–150)
ALT: 15 U/L (ref 0–55)
AST: 188 U/L (ref 5–34)
Anion Gap: 12 mEq/L — ABNORMAL HIGH (ref 3–11)
BUN: 27 mg/dL — ABNORMAL HIGH (ref 7.0–26.0)
CO2: 24 mEq/L (ref 22–29)
Calcium: 9.9 mg/dL (ref 8.4–10.4)
Chloride: 96 mEq/L — ABNORMAL LOW (ref 98–109)
Creatinine: 1.7 mg/dL — ABNORMAL HIGH (ref 0.6–1.1)
Glucose: 159 mg/dl — ABNORMAL HIGH (ref 70–140)
POTASSIUM: 3.7 meq/L (ref 3.5–5.1)
Sodium: 131 mEq/L — ABNORMAL LOW (ref 136–145)
TOTAL PROTEIN: 6.8 g/dL (ref 6.4–8.3)
Total Bilirubin: 0.51 mg/dL (ref 0.20–1.20)

## 2013-10-02 LAB — CBC WITH DIFFERENTIAL/PLATELET
BASO%: 0.6 % (ref 0.0–2.0)
Basophils Absolute: 0 10*3/uL (ref 0.0–0.1)
EOS%: 2.3 % (ref 0.0–7.0)
Eosinophils Absolute: 0.2 10*3/uL (ref 0.0–0.5)
HCT: 37.8 % (ref 34.8–46.6)
HGB: 12.3 g/dL (ref 11.6–15.9)
LYMPH%: 18.4 % (ref 14.0–49.7)
MCH: 29.5 pg (ref 25.1–34.0)
MCHC: 32.5 g/dL (ref 31.5–36.0)
MCV: 90.7 fL (ref 79.5–101.0)
MONO#: 0.6 10*3/uL (ref 0.1–0.9)
MONO%: 7.8 % (ref 0.0–14.0)
NEUT%: 70.9 % (ref 38.4–76.8)
NEUTROS ABS: 5 10*3/uL (ref 1.5–6.5)
PLATELETS: 208 10*3/uL (ref 145–400)
RBC: 4.17 10*6/uL (ref 3.70–5.45)
RDW: 14.6 % — ABNORMAL HIGH (ref 11.2–14.5)
WBC: 7.1 10*3/uL (ref 3.9–10.3)
lymph#: 1.3 10*3/uL (ref 0.9–3.3)

## 2013-10-02 NOTE — Progress Notes (Signed)
[name] OFFICE PROGRESS NOTE   PCP: Jo Peace, NP GYN: SU:  OTHER MD: [name]  No chief complaint on file.   Chief complaint: "MY lung cancer is back"  Lung cancer history: From Dr. Rolan [name]  07/30/2012 note:  "1. 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  2. Squamous cell carcinoma of the right lower lobe T1 resected at the  time of surgery on 08/09/2005. All resected lymph nodes were  negative.  3. Non small cell lung cancer of the right upper lobe detected in March  2012, +NABx 09/21/10, with positive PET scan treated with stereotactic  radiation 5000 cGy in 5 fractions from 10/17/2010 through 10/27/2010.  4. Lingular nodule which appears to be increasing in size. This  lesion may have been present on a CT scan of the chest going back  to 08/30/2010. A PET scan carried out on 03/14/2012 showed no evidence of metastatic disease. Core needle biopsy of the lingular nodule was carried out on 04/01/2012 and showed invasive poorly differentiated carcinoma with predominant small cell carcinoma component and minor squamous cell component. Stains for p63, chromogranin and synaptophysin were diffusely positive. Cytokeratin 5/6 was positive in the squamous cell carcinoma component, but negative in the rest of the tumor. The small cell component comprised about 90%  of the tumor. Slides were reviewed with Dr. Lynnell [name]. The lesion was  treated with stereotactic body radiotherapy (SBRT), 18 Gy per session, for  a total of 3 sessions, administered on 04/22/2012, 04/24/2012, and 04/29/2012, for a total of 54 Gy. The patient started chemotherapy with carboplatin and VP-16 on 05/19/2012 for a planned 4 cycles. Cycle #4 was  completed on 07/31/2012."  More recently the patient was evaluated by [name], after the patient presented to the emergency room with a cough and chest pain and found to have several cracked ribs. A CT angiogram found a right lower lobe nodule concerning for recurrence and she was referred back to oncology for further evaluation. PET scan 07/03/2013 showed, aside from 2 right lung nodules which were nonspecific, new hypermetabolic activity within the head of the pancreas. MRI of the abdomen obtained 07/24/2013 confirmed a soft tissue mass centered at the ventral aspect of the pancreatic head measuring 3.3 cm, as well as innumerable liver lesions which were new as compared to July of 2014. On 08/28/2013 the patient underwent right liver lobe biopsy, which showed (SZ B [phone]) a metastatic poorly differentiated neuroendocrine carcinoma which was positive for CD 56, chromogranin, and synaptophysin. TTF-1 was positive, consistent with a lung primary.  The patient's subsequent history is as detailed below   INTERVAL HISTORY: [name] is an  80 y.o. [name] woman formerly followed by [name] and then [name], establishing herself today in my practice. She is accompanied by her daughter [name]    REVIEW OF SYSTEMS:   She tolerated her biopsy without unusual complications. She denies any unusual headaches, visual changes, nausea, or vomiting. Her cough is currently well-controlled. She uses oxygen 24 7. She mostly gets around in a wheelchair. Pain is well-controlled on her Percocet, which causes her only mild constipation. She denies any rash, fever, or bleeding. In general she has a positive mood. A detailed review of systems was otherwise stable   MEDICAL HISTORY:  Past Medical History  Diagnosis Date  . Cancer     lung ca, RUL  . CHF (congestive heart failure)   . Hypertension   . Hx pulmonary embolism 03/2011    on coumadin till 02/2011,stopped  due to gi bleed  . GERD  (gastroesophageal reflux disease)   . GI bleed   . Colitis     history  . Chest pain 06/30/11    hx blood clot in lung  . Bell's palsy   . PVD (peripheral vascular disease)   . Lung cancer     metachronous stage I nscca RUL  . History of radiation therapy 5/8/10th,14th,16th,and 10/27/2010    RUL lung nscca  . Blood transfusion     during lumbar surgery  . History of chemotherapy 2006-207    carboplatin/VP16 last January 207  . Clotting disorder     DVT/ PE  . Lung cancer 04/01/12    biopsy-lingular nodule-inv carcinoma, minor squamous cell ca  . History of radiation therapy 11/12, 11/14, 04/29/2012    lingular -squamous cell carcinoma, nodule  . Paroxysmal atrial fibrillation   . On continuous oral anticoagulation   . Diabetes mellitus without complication     patient states "diet controlled."   ALLERGIES:  is allergic to aspirin.  MEDICATIONS: has a current medication list which includes the following prescription(s): acetaminophen, alendronate, amlodipine, benazepril, cimetidine, diphenhydramine-acetaminophen, fenofibrate, furosemide, methocarbamol, metoprolol tartrate, omega-3 acid ethyl esters, oxycodone-acetaminophen, pantoprazole, ropinirole, sennosides-docusate sodium, simvastatin, tiotropium, and warfarin.  SURGICAL HISTORY:  Past Surgical History  Procedure Laterality Date  . Right lower lobe lung resection  2007  . Cataract surgery      b/l  lens implants  . Abdominal hysterectomy      partial age 84  . Shoulder open rotator cuff repair  04/2003    left  . Appendectomy    . Carpal tunnel release    . Bladder surgery    . Lumbar spine surgery      x3, L4-S1  . Right lung biopsy  2006  . Coronary angioplasty with stent placement  2001    renal stent  . 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 She is a widow and lives with her daughter [name], who is herself  disabled. A daily as a history of breast cancer dating back to 2005 and apparently developed congestive heart failure. The patient has 2 other children, her son [name] who lives in [place] and is disabled secondary to back pain and Jasmine Awe and "has an anger problem". The patient has 2 biological and 2 Vicente Serene the grandchildren and 4 great-grandchildren. She is a Psychologist, forensic.  ADVANCED DIRECTIVES: On her 10/02/2013 visit the patient was given healthcare power of attorney documents which she completed and notarized. She has named her daughter [name] as her healthcare power of attorney. Ideally I can be reached at the patient's home phone. At that same visit we also discussed end-of-life issues. The patient is very optimistic and wishes to have any treatment that may prolong her life or make her life better. She agrees that in case of a terminal event she should not be "placed on machines or life support". Her daughter was present during this discussion.  PHYSICAL EXAMINATION: BP 91/58  Pulse 94  Temp(Src) 98.4 F (36.9 C) (Oral)  Resp 17  Ht 5\' 4"  (1.626  m)  Wt 213 lb 3.2 oz (96.707 kg)  BMI 36.58 kg/m2  ECOG PERFORMANCE STATUS: 2 - Symptomatic, <50% confined to bed  Sclerae unicteric, EOMs intact Oropharynx clear and moist-- oxygen by nasal cannula running No cervical or supraclavicular adenopathy Lungs no rales or rhonchi Heart regular rate and rhythm Abd soft, obese, nontender, positive bowel sounds MSK no focal spinal tenderness Neuro: nonfocal, well oriented, appropriate affect Breasts: Deferred    LABORATORY DATA: Results for orders placed in visit on 10/02/13 (from the past 48 hour(s))  COMPREHENSIVE METABOLIC PANEL (JG28)     Status: Abnormal   Collection Time    10/02/13  1:23 PM      Result Value Ref Range   Sodium 131 (*) 136 - 145 mEq/L   Potassium 3.7  3.5 - 5.1 mEq/L   Chloride 96 (*) 98 - 109 mEq/L   CO2 24  22 - 29 mEq/L    Glucose 159 (*) 70 - 140 mg/dl   BUN 27.0 (*) 7.0 - 26.0 mg/dL   Creatinine 1.7 (*) 0.6 - 1.1 mg/dL   Total Bilirubin 0.51  0.20 - 1.20 mg/dL   Alkaline Phosphatase 49  40 - 150 U/L   AST 188 (*) 5 - 34 U/L   ALT 15  0 - 55 U/L   Total Protein 6.8  6.4 - 8.3 g/dL   Albumin 3.2 (*) 3.5 - 5.0 g/dL   Calcium 9.9  8.4 - 10.4 mg/dL   Anion Gap 12 (*) 3 - 11 mEq/L  CBC WITH DIFFERENTIAL     Status: Abnormal   Collection Time    10/02/13  1:24 PM      Result Value Ref Range   WBC 7.1  3.9 - 10.3 10e3/uL   NEUT# 5.0  1.5 - 6.5 10e3/uL   HGB 12.3  11.6 - 15.9 g/dL   HCT 37.8  34.8 - 46.6 %   Platelets 208  145 - 400 10e3/uL   MCV 90.7  79.5 - 101.0 fL   MCH 29.5  25.1 - 34.0 pg   MCHC 32.5  31.5 - 36.0 g/dL   RBC 4.17  3.70 - 5.45 10e6/uL   RDW 14.6 (*) 11.2 - 14.5 %   lymph# 1.3  0.9 - 3.3 10e3/uL   MONO# 0.6  0.1 - 0.9 10e3/uL   Eosinophils Absolute 0.2  0.0 - 0.5 10e3/uL   Basophils Absolute 0.0  0.0 - 0.1 10e3/uL   NEUT% 70.9  38.4 - 76.8 %   LYMPH% 18.4  14.0 - 49.7 %   MONO% 7.8  0.0 - 14.0 %   EOS% 2.3  0.0 - 7.0 %   BASO% 0.6  0.0 - 2.0 %    RADIOGRAPHIC STUDIES: PET scan and CT results as well as biopsy results reviewed with the patient and her daughter    ASSESSMENT: 22 y.o. [place], [place] woman   (1) with a history of neuroendocrine lung cancer (small cell close(biopsied November of 2006, and treated with carboplatin and VP-16 between November of 2006 and January of 2007, followed by right lower lobe lobectomy March of 2007  (2) squamous cell carcinoma of the right lower lobe, T1 N0, resected at the same time as above  (3) non-small cell lung cancer of the right upper lobe diagnosed April of 2012, treated with stereotactic radiation (50 gray in 5 fractions) completed may of 2012  (4) metastatic neuroendocrine lung cancer (recurrence from #1 above) diagnosed by liver biopsy 09/05/2013  PLAN:  We spent well over an hour going over Sacoya's situation  today. She and her daughter understand that stage IV lung cancer is not curable. Furthermore the patient is elderly with multiple comorbidities and therefore the benefits of chemotherapy are questionable. We are likely to face increased problems with side effects, requiring dose reductions and delays, which are likely to render the chemotherapy less effective. The option of no treatment with hospice referral was discussed.  The patient however is very optimistic. She tells me that she tolerated her earlier chemotherapy quite well. After further discussion of options we decided we would try the same chemotherapy as before, understanding that this tumor survived that earlier treatment and therefore may be resistant. After 2 cycles the patient will be restaged with a MRI of the liver. If we do not document at least disease control we will consider a hospice referral versus further chemotherapy.  We did clarify advanced directives today and the patient has formally named her daughter asked her healthcare power of attorney.  Kalianna and her daughter have a good understanding of the possible toxicities, side effects, and complications of the chemotherapy we are going to try, namely the same she took before (carboplatin and etoposide at reduced doses). The patient will benefit from having a port placed and this is being arranged. We're planning to start treatments the first Tuesday in May.  The patient agrees to this plan. She understands the goal of treatment is control. They will call with any problems that may develop before the next visit here.  Chauncey Cruel, MD 10/02/2013 2:16 PM

## 2013-10-02 NOTE — Progress Notes (Signed)
[name] Social [facility]  Clinical Social Work was referred by Dr. Jana Hakim  to review and complete healthcare advance directives.  Clinical Social Worker met with patient and her daughter, [name] in exam room.  The patient designated her daughter, [name] as their primary healthcare agent and did not designate a secondary agent.  Patient did not want to  complete a healthcare living will.    Clinical Social Worker notarized documents and made copies for patient/family. Clinical Social Worker will send documents to medical records to be scanned into patient's chart. CSW reviewed with daughter resources at the [facility] to assist them. Daughter is interested in attending the caregiver support group next month. CSW provided daughter with CSW's contact information as well.  Clinical Social Worker encouraged patient/family to contact with any additional questions or concerns.  Loren Racer, LCSW Clinical Social Worker [facility] [place] Wednesday, Thursday and Friday Phone: [phone] Fax: [phone]

## 2013-10-05 ENCOUNTER — Telehealth: Payer: Self-pay | Admitting: *Deleted

## 2013-10-05 NOTE — Telephone Encounter (Signed)
Per staff message and POF I have scheduled appts.  Advised scheduler that 1215 was the first available JMW

## 2013-10-06 ENCOUNTER — Telehealth: Payer: Self-pay | Admitting: Oncology

## 2013-10-06 ENCOUNTER — Other Ambulatory Visit: Payer: Self-pay | Admitting: *Deleted

## 2013-10-06 ENCOUNTER — Other Ambulatory Visit: Payer: Self-pay | Admitting: Radiology

## 2013-10-06 NOTE — Telephone Encounter (Signed)
tx for 5/4 and lb/fu for 5/11 added. pt to see GM 5/11 cannot schedule tx pt w/cp2. tx on 5/4 @ 12:15pm. and lb/fu @ 8am. dtr aware of time differenc and that pt will be worked in [employer].

## 2013-10-08 ENCOUNTER — Encounter (HOSPITAL_COMMUNITY): Payer: Self-pay | Admitting: Pharmacy Technician

## 2013-10-08 ENCOUNTER — Encounter: Payer: Self-pay | Admitting: Pharmacist

## 2013-10-09 ENCOUNTER — Encounter (HOSPITAL_COMMUNITY): Payer: Self-pay

## 2013-10-09 ENCOUNTER — Ambulatory Visit (HOSPITAL_COMMUNITY)
Admission: RE | Admit: 2013-10-09 | Discharge: 2013-10-09 | Disposition: A | Discharge status: Home / Self Care | Payer: Medicare Other | Source: Ambulatory Visit | Attending: Oncology | Admitting: Oncology

## 2013-10-09 ENCOUNTER — Other Ambulatory Visit: Payer: Self-pay | Admitting: Oncology

## 2013-10-09 DIAGNOSIS — Z90710 Acquired absence of both cervix and uterus: Secondary | ICD-10-CM | POA: Insufficient documentation

## 2013-10-09 DIAGNOSIS — C3490 Malignant neoplasm of unspecified part of unspecified bronchus or lung: Secondary | ICD-10-CM

## 2013-10-09 DIAGNOSIS — Z79899 Other long term (current) drug therapy: Secondary | ICD-10-CM | POA: Insufficient documentation

## 2013-10-09 DIAGNOSIS — E119 Type 2 diabetes mellitus without complications: Secondary | ICD-10-CM | POA: Insufficient documentation

## 2013-10-09 DIAGNOSIS — I509 Heart failure, unspecified: Secondary | ICD-10-CM | POA: Insufficient documentation

## 2013-10-09 DIAGNOSIS — K219 Gastro-esophageal reflux disease without esophagitis: Secondary | ICD-10-CM | POA: Insufficient documentation

## 2013-10-09 DIAGNOSIS — I10 Essential (primary) hypertension: Secondary | ICD-10-CM | POA: Insufficient documentation

## 2013-10-09 DIAGNOSIS — Z87891 Personal history of nicotine dependence: Secondary | ICD-10-CM | POA: Insufficient documentation

## 2013-10-09 DIAGNOSIS — Z9089 Acquired absence of other organs: Secondary | ICD-10-CM | POA: Insufficient documentation

## 2013-10-09 DIAGNOSIS — Z7901 Long term (current) use of anticoagulants: Secondary | ICD-10-CM | POA: Insufficient documentation

## 2013-10-09 DIAGNOSIS — Z923 Personal history of irradiation: Secondary | ICD-10-CM | POA: Insufficient documentation

## 2013-10-09 DIAGNOSIS — Z9221 Personal history of antineoplastic chemotherapy: Secondary | ICD-10-CM | POA: Insufficient documentation

## 2013-10-09 DIAGNOSIS — Z902 Acquired absence of lung [part of]: Secondary | ICD-10-CM | POA: Insufficient documentation

## 2013-10-09 DIAGNOSIS — Z9981 Dependence on supplemental oxygen: Secondary | ICD-10-CM | POA: Insufficient documentation

## 2013-10-09 DIAGNOSIS — I739 Peripheral vascular disease, unspecified: Secondary | ICD-10-CM | POA: Insufficient documentation

## 2013-10-09 DIAGNOSIS — C3410 Malignant neoplasm of upper lobe, unspecified bronchus or lung: Secondary | ICD-10-CM | POA: Insufficient documentation

## 2013-10-09 DIAGNOSIS — Z86711 Personal history of pulmonary embolism: Secondary | ICD-10-CM | POA: Insufficient documentation

## 2013-10-09 DIAGNOSIS — Z801 Family history of malignant neoplasm of trachea, bronchus and lung: Secondary | ICD-10-CM | POA: Insufficient documentation

## 2013-10-09 LAB — CBC WITH DIFFERENTIAL/PLATELET
BASOS PCT: 0 % (ref 0–1)
Basophils Absolute: 0 10*3/uL (ref 0.0–0.1)
EOS ABS: 0.2 10*3/uL (ref 0.0–0.7)
Eosinophils Relative: 2 % (ref 0–5)
HEMATOCRIT: 36.9 % (ref 36.0–46.0)
HEMOGLOBIN: 12.5 g/dL (ref 12.0–15.0)
Lymphocytes Relative: 18 % (ref 12–46)
Lymphs Abs: 1.7 10*3/uL (ref 0.7–4.0)
MCH: 30.1 pg (ref 26.0–34.0)
MCHC: 33.9 g/dL (ref 30.0–36.0)
MCV: 88.9 fL (ref 78.0–100.0)
MONO ABS: 1.2 10*3/uL — AB (ref 0.1–1.0)
Monocytes Relative: 13 % — ABNORMAL HIGH (ref 3–12)
Neutro Abs: 6.2 10*3/uL (ref 1.7–7.7)
Neutrophils Relative %: 67 % (ref 43–77)
Platelets: 218 10*3/uL (ref 150–400)
RBC: 4.15 MIL/uL (ref 3.87–5.11)
RDW: 14.7 % (ref 11.5–15.5)
WBC: 9.4 10*3/uL (ref 4.0–10.5)

## 2013-10-09 LAB — GLUCOSE, CAPILLARY: Glucose-Capillary: 102 mg/dL — ABNORMAL HIGH (ref 70–99)

## 2013-10-09 LAB — PROTIME-INR
INR: 1.45 (ref 0.00–1.49)
Prothrombin Time: 17.3 seconds — ABNORMAL HIGH (ref 11.6–15.2)

## 2013-10-09 LAB — APTT: aPTT: 30 seconds (ref 24–37)

## 2013-10-09 MED ORDER — CEFAZOLIN SODIUM-DEXTROSE 2-3 GM-% IV SOLR
2.0000 g | INTRAVENOUS | Status: AC
  Administered 2013-10-09: 2 g via INTRAVENOUS
  Filled 2013-10-09: qty 50

## 2013-10-09 MED ORDER — MIDAZOLAM HCL 2 MG/2ML IJ SOLN
INTRAMUSCULAR | Status: AC | PRN
  Administered 2013-10-09 (×4): 0.5 mg via INTRAVENOUS

## 2013-10-09 MED ORDER — MIDAZOLAM HCL 2 MG/2ML IJ SOLN
INTRAMUSCULAR | Status: AC
  Filled 2013-10-09: qty 6

## 2013-10-09 MED ORDER — SODIUM CHLORIDE 0.9 % IV SOLN: INTRAVENOUS | Status: DC

## 2013-10-09 MED ORDER — FENTANYL CITRATE 0.05 MG/ML IJ SOLN
INTRAMUSCULAR | Status: AC
  Filled 2013-10-09: qty 6

## 2013-10-09 MED ORDER — HEPARIN SOD (PORK) LOCK FLUSH 100 UNIT/ML IV SOLN
INTRAVENOUS | Status: AC
  Filled 2013-10-09: qty 5

## 2013-10-09 MED ORDER — FENTANYL CITRATE 0.05 MG/ML IJ SOLN
INTRAMUSCULAR | Status: AC | PRN
  Administered 2013-10-09 (×2): 25 ug via INTRAVENOUS

## 2013-10-09 MED ORDER — LIDOCAINE HCL 1 % IJ SOLN
INTRAMUSCULAR | Status: DC
Start: 2013-10-09 — End: 2013-10-10
  Filled 2013-10-09: qty 20

## 2013-10-09 MED ORDER — HEPARIN SOD (PORK) LOCK FLUSH 100 UNIT/ML IV SOLN
INTRAVENOUS | Status: AC | PRN
  Administered 2013-10-09: 500 [IU]

## 2013-10-09 NOTE — Procedures (Signed)
Procedure:  Porta-cath placement Access:  Right IJ vein Findings:  SL Power Port placed with tip at cavoatrial junction.  OK to use.  No PTX.

## 2013-10-09 NOTE — H&P (Signed)
Agree.  Patient seen.  For port placement today.

## 2013-10-09 NOTE — H&P (Signed)
Chief Complaint: "I am here for a port." Referring Physician: Dr. Jana Hakim HPI: [name] is an 80 y.o. female with history of PAF and DVT/PE and on chronic coumadin which has been held. She is here today for an image guided port placement for chemotherapy and has stage IV lung cancer s/p RLL resection. She is on chronic oxygen 2L with chronic shortness of breath, denies any worsening symptoms today. She denies any chest pain or palpitations. She denies any active signs of bleeding or excessive bruising. She denies any recent fever or chills. The patient denies any history of sleep apnea.. She has previously tolerated sedation without complications.   Past Medical History:  Past Medical History  Diagnosis Date  . Cancer     lung ca, RUL  . CHF (congestive heart failure)   . Hypertension   . Hx pulmonary embolism 03/2011    on coumadin till 02/2011,stopped  due to gi bleed  . GERD (gastroesophageal reflux disease)   . GI bleed   . Colitis     history  . Chest pain 06/30/11    hx blood clot in lung  . Bell's palsy   . PVD (peripheral vascular disease)   . Lung cancer     metachronous stage I nscca RUL  . History of radiation therapy 5/8/10th,14th,16th,and 10/27/2010    RUL lung nscca  . Blood transfusion     during lumbar surgery  . History of chemotherapy 2006-207    carboplatin/VP16 last January 207  . Clotting disorder     DVT/ PE  . Lung cancer 04/01/12    biopsy-lingular nodule-inv carcinoma, minor squamous cell ca  . History of radiation therapy 11/12, 11/14, 04/29/2012    lingular -squamous cell carcinoma, nodule  . Paroxysmal atrial fibrillation   . On continuous oral anticoagulation   . Diabetes mellitus without complication     patient states "diet controlled."    Past Surgical History:  Past Surgical History  Procedure Laterality Date  . Right lower lobe lung resection  2007  . Cataract surgery      b/l  lens implants  . Abdominal hysterectomy      partial  age 31  . Shoulder open rotator cuff repair  04/2003    left  . Appendectomy    . Carpal tunnel release    . Bladder surgery    . Lumbar spine surgery      x3, L4-S1  . Right lung biopsy  2006  . Coronary angioplasty with stent placement  2001    renal stent  . Colonoscopies  2011    polyp removal     Family History:  Family History  Problem Relation Age of Onset  . Breast cancer Mother   . Uterine cancer Sister   . Lung cancer Sister   . Breast cancer Maternal Grandmother   . Colon cancer Sister   . Uterine cancer Sister     Social History:  reports that she quit smoking about 9 years ago. Her smoking use included Cigarettes. She has a 108 pack-year smoking history. She has never used smokeless tobacco. She reports that she does not drink alcohol or use illicit drugs.  Allergies:  Allergies  Allergen Reactions  . Aspirin Other (See Comments)    Stomach pain    Medications:   Medication List    ASK your doctor about these medications       acetaminophen 650 MG CR tablet  Commonly known as:  TYLENOL  Take 650 mg by mouth every 8 (eight) hours as needed. For pain     alendronate 70 MG tablet  Commonly known as:  FOSAMAX  Take 70 mg by mouth every 7 (seven) days. Take with a full glass of water on an empty stomach. On Tuesdays     amLODipine 5 MG tablet  Commonly known as:  NORVASC  Take 5 mg by mouth every morning.     benazepril 10 MG tablet  Commonly known as:  LOTENSIN  Take 10 mg by mouth every morning.     cimetidine 200 MG tablet  Commonly known as:  TAGAMET  Take 200 mg by mouth daily.     diphenhydramine-acetaminophen 25-500 MG Tabs  Commonly known as:  TYLENOL PM  Take 1-2 tablets by mouth at bedtime as needed (pain/sleep). For sleeplessness     fenofibrate 145 MG tablet  Commonly known as:  TRICOR  Take 145 mg by mouth every morning.     furosemide 40 MG tablet  Commonly known as:  LASIX  Take 40 mg by mouth every morning.      methocarbamol 500 MG tablet  Commonly known as:  ROBAXIN  Take 500 mg by mouth every 8 (eight) hours as needed. For muscle spasms     metoprolol tartrate 25 MG tablet  Commonly known as:  LOPRESSOR  Take 25 mg by mouth 2 (two) times daily.     omega-3 acid ethyl esters 1 G capsule  Commonly known as:  LOVAZA  Take 2 g by mouth 2 (two) times daily.     oxyCODONE-acetaminophen 5-325 MG per tablet  Commonly known as:  PERCOCET/ROXICET  Take 1 tablet by mouth every 4 (four) hours as needed. For pain     pantoprazole 40 MG tablet  Commonly known as:  PROTONIX  Take 40 mg by mouth daily.     rOPINIRole 0.25 MG tablet  Commonly known as:  REQUIP  Take 0.25 mg by mouth at bedtime.     sennosides-docusate sodium 8.6-50 MG tablet  Commonly known as:  SENOKOT-S  Take 2 tablets by mouth daily. For constipation     simvastatin 80 MG tablet  Commonly known as:  ZOCOR  Take 80 mg by mouth at bedtime.     tiotropium 18 MCG inhalation capsule  Commonly known as:  SPIRIVA  Place 18 mcg into inhaler and inhale daily.     warfarin 3 MG tablet  Commonly known as:  COUMADIN  Take 1.5-3 mg by mouth See admin instructions. Take 1.5 mg on Sat and Sun, Monday-Friday pt takes 3 mg       Please HPI for pertinent positives, otherwise complete 10 system ROS negative.  Physical Exam: BP 83/69  Pulse 64  Temp(Src) 98.2 F (36.8 C) (Oral)  Resp 20 There is no weight on file to calculate BMI.  General Appearance:  Alert, cooperative, no distress, on 2L oxygen  Head:  Normocephalic, without obvious abnormality, atraumatic  Neck: Supple, symmetrical, trachea midline  Lungs:   Clear to auscultation bilaterally, no w/r/r, respirations unlabored without use of accessory muscles.  Chest Wall:  No tenderness or deformity  Heart:  Regular rate and rhythm, S1, S2 normal, no murmur, rub or gallop.  Abdomen:   Soft, non-tender, non distended, (+) BS  Extremities: Extremities normal, atraumatic, no  cyanosis or edema  Neurologic: Normal affect, no gross deficits.   Results for orders placed during the hospital encounter of 10/09/13 (from the past 48 hour(s))  APTT     Status: None   Collection Time    10/09/13  8:25 AM      Result Value Ref Range   aPTT 30  24 - 37 seconds  CBC WITH DIFFERENTIAL     Status: Abnormal   Collection Time    10/09/13  8:25 AM      Result Value Ref Range   WBC 9.4  4.0 - 10.5 K/uL   RBC 4.15  3.87 - 5.11 MIL/uL   Hemoglobin 12.5  12.0 - 15.0 g/dL   HCT 36.9  36.0 - 46.0 %   MCV 88.9  78.0 - 100.0 fL   MCH 30.1  26.0 - 34.0 pg   MCHC 33.9  30.0 - 36.0 g/dL   RDW 14.7  11.5 - 15.5 %   Platelets 218  150 - 400 K/uL   Neutrophils Relative % 67  43 - 77 %   Neutro Abs 6.2  1.7 - 7.7 K/uL   Lymphocytes Relative 18  12 - 46 %   Lymphs Abs 1.7  0.7 - 4.0 K/uL   Monocytes Relative 13 (*) 3 - 12 %   Monocytes Absolute 1.2 (*) 0.1 - 1.0 K/uL   Eosinophils Relative 2  0 - 5 %   Eosinophils Absolute 0.2  0.0 - 0.7 K/uL   Basophils Relative 0  0 - 1 %   Basophils Absolute 0.0  0.0 - 0.1 K/uL  PROTIME-INR     Status: Abnormal   Collection Time    10/09/13  8:25 AM      Result Value Ref Range   Prothrombin Time 17.3 (*) 11.6 - 15.2 seconds   INR 1.45  0.00 - 1.49   No results found.  Assessment/Plan Stage IV lung cancer s/p RLL resection on chronic oxygen.  Request for image guided port a catheter placement for chemotherapy.  Patient has been NPO, labs reviewed, coumadin held since 10/04/13, afebrile, ancef ordered. History of PAF, DVT/PE on chronic coumadin, held Risks and Benefits discussed with the patient and her daughter. All of the patient's questions were answered, patient and daughter are agreeable to proceed. Consent signed and in chart.   Hedy Jacob PA-C 10/09/2013, 9:45 AM

## 2013-10-09 NOTE — Discharge Instructions (Signed)
Implanted Port Home Guide °An implanted port is a type of central line that is placed under the skin. Central lines are used to provide IV access when treatment or nutrition needs to be given through a person's veins. Implanted ports are used for long-term IV access. An implanted port may be placed because:  °· You need IV medicine that would be irritating to the small veins in your hands or arms.   °· You need long-term IV medicines, such as antibiotics.   °· You need IV nutrition for a long period.   °· You need frequent blood draws for lab tests.   °· You need dialysis.   °Implanted ports are usually placed in the chest area, but they can also be placed in the upper arm, the abdomen, or the leg. An implanted port has two main parts:  °· Reservoir. The reservoir is round and will appear as a small, raised area under your skin. The reservoir is the part where a needle is inserted to give medicines or draw blood.   °· Catheter. The catheter is a thin, flexible tube that extends from the reservoir. The catheter is placed into a large vein. Medicine that is inserted into the reservoir goes into the catheter and then into the vein.   °HOW WILL I CARE FOR MY INCISION SITE? °Do not get the incision site wet. Bathe or shower as directed by your health care provider.  °HOW IS MY PORT ACCESSED? °Special steps must be taken to access the port:  °· Before the port is accessed, a numbing cream can be placed on the skin. This helps numb the skin over the port site.   °· Your health care provider uses a sterile technique to access the port. °· Your health care provider must put on a mask and sterile gloves. °· The skin over your port is cleaned carefully with an antiseptic and allowed to dry. °· The port is gently pinched between sterile gloves, and a needle is inserted into the port. °· Only "non-coring" port needles should be used to access the port. Once the port is accessed, a blood return should be checked. This helps  ensure that the port is in the vein and is not clogged.   °· If your port needs to remain accessed for a constant infusion, a clear (transparent) bandage will be placed over the needle site. The bandage and needle will need to be changed every week, or as directed by your health care provider.   °· Keep the bandage covering the needle clean and dry. Do not get it wet. Follow your health care provider's instructions on how to take a shower or bath while the port is accessed.   °· If your port does not need to stay accessed, no bandage is needed over the port.   °WHAT IS FLUSHING? °Flushing helps keep the port from getting clogged. Follow your health care provider's instructions on how and when to flush the port. Ports are usually flushed with saline solution or a medicine called heparin. The need for flushing will depend on how the port is used.  °· If the port is used for intermittent medicines or blood draws, the port will need to be flushed:   °· After medicines have been given.   °· After blood has been drawn.   °· As part of routine maintenance.   °· If a constant infusion is running, the port may not need to be flushed.   °HOW LONG WILL MY PORT STAY IMPLANTED? °The port can stay in for as long as your health care   provider thinks it is needed. When it is time for the port to come out, surgery will be done to remove it. The procedure is similar to the one performed when the port was put in.  WHEN SHOULD I SEEK IMMEDIATE MEDICAL CARE? When you have an implanted port, you should seek immediate medical care if:   You notice a bad smell coming from the incision site.   You have swelling, redness, or drainage at the incision site.   You have more swelling or pain at the port site or the surrounding area.   You have a fever that is not controlled with medicine. Document Released: 05/28/2005 Document Revised: 03/18/2013 Document Reviewed: 02/02/2013 [facility] Patient Information 2014 [place]. Moderate Sedation, Adult Moderate sedation is given to help you relax or even sleep through a procedure. You may remain sleepy, be clumsy, or have poor balance for several hours following this procedure. Arrange for a responsible adult, family member, or friend to take you home. A responsible adult should stay with you for at least 24 hours or until the medicines have worn off.  Do not participate in any activities where you could become injured for the next 24 hours, or until you feel normal again. Do not:  Drive.  Swim.  Ride a bicycle.  Operate heavy machinery.  Cook.  Use power tools.  Climb ladders.  Work at [employer].  Do not make important decisions or sign legal documents until you are improved.  Vomiting may occur if you eat too soon. When you can drink without vomiting, try water, juice, or soup. Try solid foods if you feel little or no nausea.  Only take over-the-counter or prescription medications for pain, discomfort, or fever as directed by your caregiver.If pain medications have been prescribed for you, ask your caregiver how soon it is safe to take them.  Make sure you and your family fully understands everything about the medication given to you. Make sure you understand what side effects may occur.  You should not drink alcohol, take sleeping pills, or medications that cause drowsiness for at least 24 hours.  If you smoke, do not smoke alone.  If you are feeling better, you may resume normal activities 24 hours after receiving sedation.  Keep all appointments as scheduled. Follow all instructions.  Ask questions if you do not understand. SEEK MEDICAL CARE IF:   Your skin is pale or bluish in color.  You continue to feel sick to your stomach (nauseous) or throw up (vomit).  Your pain is getting worse and not helped by medication.  You have bleeding or swelling.  You are still sleepy or feeling clumsy after 24 hours. SEEK IMMEDIATE MEDICAL CARE IF:    You develop a rash.  You have difficulty breathing.  You develop any type of allergic problem.  You have a fever. Document Released: 02/20/2001 Document Revised: 08/20/2011 Document Reviewed: 02/02/2013 [facility] Patient Information 2014 [place]. Implanted Port Insertion, Care After Refer to this sheet in the next few weeks. These instructions provide you with information on caring for yourself after your procedure. Your health care provider may also give you more specific instructions. Your treatment has been planned according to current medical practices, but problems sometimes occur. Call your health care provider if you have any problems or questions after your procedure. WHAT TO EXPECT AFTER THE PROCEDURE After your procedure, it is typical to have the following:   Discomfort at the port insertion site. Ice packs to the  area will help.  Bruising on the skin over the port. This will subside in 3 4 days. HOME CARE INSTRUCTIONS  After your port is placed, you will get a manufacturer's information card. The card has information about your port. Keep this card with you at all times.   Know what kind of port you have. There are many types of ports available.   Wear a medical alert bracelet in case of an emergency. This can help alert health care workers that you have a port.   The port can stay in for as long as your health care provider believes it is necessary.   A home health care nurse may give medicines and take care of the port.   You or a family member can get special training and directions for giving medicine and taking care of the port at home.  SEEK MEDICAL CARE IF:  Your port does not flush or you are unable to get a blood return.   SEEK IMMEDIATE MEDICAL CARE IF:  You have new fluid or pus coming from your incision.   You notice a bad smell coming from your incision site.   You have swelling, pain, or more redness at the incision or port site.    You have a fever or chills.   You have chest pain or shortness of breath. Document Released: 03/18/2013 Document Reviewed: 02/02/2013 [facility] Patient Information 2014 [place], [place].

## 2013-10-12 ENCOUNTER — Ambulatory Visit (HOSPITAL_BASED_OUTPATIENT_CLINIC_OR_DEPARTMENT_OTHER): Payer: Medicare Other

## 2013-10-12 ENCOUNTER — Ambulatory Visit (HOSPITAL_BASED_OUTPATIENT_CLINIC_OR_DEPARTMENT_OTHER): Payer: Medicare Other | Admitting: Oncology

## 2013-10-12 ENCOUNTER — Other Ambulatory Visit (HOSPITAL_BASED_OUTPATIENT_CLINIC_OR_DEPARTMENT_OTHER): Payer: Medicare Other

## 2013-10-12 VITALS — BP 121/62 | HR 108 | Temp 98.4°F | Resp 20 | Ht 64.0 in | Wt 213.7 lb

## 2013-10-12 DIAGNOSIS — C7A090 Malignant carcinoid tumor of the bronchus and lung: Secondary | ICD-10-CM

## 2013-10-12 DIAGNOSIS — C787 Secondary malignant neoplasm of liver and intrahepatic bile duct: Secondary | ICD-10-CM

## 2013-10-12 DIAGNOSIS — Z5111 Encounter for antineoplastic chemotherapy: Secondary | ICD-10-CM

## 2013-10-12 DIAGNOSIS — C3490 Malignant neoplasm of unspecified part of unspecified bronchus or lung: Secondary | ICD-10-CM

## 2013-10-12 DIAGNOSIS — Z85118 Personal history of other malignant neoplasm of bronchus and lung: Secondary | ICD-10-CM

## 2013-10-12 LAB — CBC WITH DIFFERENTIAL/PLATELET
BASO%: 0.5 % (ref 0.0–2.0)
Basophils Absolute: 0 10*3/uL (ref 0.0–0.1)
EOS%: 2.3 % (ref 0.0–7.0)
Eosinophils Absolute: 0.2 10*3/uL (ref 0.0–0.5)
HCT: 37.4 % (ref 34.8–46.6)
HGB: 12.4 g/dL (ref 11.6–15.9)
LYMPH%: 15.5 % (ref 14.0–49.7)
MCH: 30.3 pg (ref 25.1–34.0)
MCHC: 33.2 g/dL (ref 31.5–36.0)
MCV: 91.5 fL (ref 79.5–101.0)
MONO#: 1 10*3/uL — AB (ref 0.1–0.9)
MONO%: 11.7 % (ref 0.0–14.0)
NEUT#: 5.9 10*3/uL (ref 1.5–6.5)
NEUT%: 70 % (ref 38.4–76.8)
Platelets: 179 10*3/uL (ref 145–400)
RBC: 4.09 10*6/uL (ref 3.70–5.45)
RDW: 15.1 % — ABNORMAL HIGH (ref 11.2–14.5)
WBC: 8.4 10*3/uL (ref 3.9–10.3)
lymph#: 1.3 10*3/uL (ref 0.9–3.3)

## 2013-10-12 LAB — COMPREHENSIVE METABOLIC PANEL (CC13)
ALT: 12 U/L (ref 0–55)
ANION GAP: 12 meq/L — AB (ref 3–11)
AST: 249 U/L — AB (ref 5–34)
Albumin: 3.1 g/dL — ABNORMAL LOW (ref 3.5–5.0)
Alkaline Phosphatase: 46 U/L (ref 40–150)
BUN: 25.7 mg/dL (ref 7.0–26.0)
CO2: 25 meq/L (ref 22–29)
CREATININE: 1.5 mg/dL — AB (ref 0.6–1.1)
Calcium: 10 mg/dL (ref 8.4–10.4)
Chloride: 94 mEq/L — ABNORMAL LOW (ref 98–109)
Glucose: 117 mg/dl (ref 70–140)
Potassium: 4 mEq/L (ref 3.5–5.1)
SODIUM: 131 meq/L — AB (ref 136–145)
TOTAL PROTEIN: 6.7 g/dL (ref 6.4–8.3)
Total Bilirubin: 0.59 mg/dL (ref 0.20–1.20)

## 2013-10-12 MED ORDER — DEXAMETHASONE SODIUM PHOSPHATE 20 MG/5ML IJ SOLN
20.0000 mg | Freq: Once | INTRAMUSCULAR | Status: AC
  Administered 2013-10-12: 20 mg via INTRAVENOUS

## 2013-10-12 MED ORDER — SODIUM CHLORIDE 0.9 % IV SOLN
80.0000 mg/m2 | Freq: Once | INTRAVENOUS | Status: AC
  Administered 2013-10-12: 170 mg via INTRAVENOUS
  Filled 2013-10-12: qty 8.5

## 2013-10-12 MED ORDER — DEXAMETHASONE SODIUM PHOSPHATE 20 MG/5ML IJ SOLN
INTRAMUSCULAR | Status: AC
  Filled 2013-10-12: qty 5

## 2013-10-12 MED ORDER — HEPARIN SOD (PORK) LOCK FLUSH 100 UNIT/ML IV SOLN
500.0000 [IU] | Freq: Once | INTRAVENOUS | Status: AC | PRN
  Administered 2013-10-12: 500 [IU]
  Filled 2013-10-12: qty 5

## 2013-10-12 MED ORDER — ONDANSETRON 16 MG/50ML IVPB (CHCC)
16.0000 mg | Freq: Once | INTRAVENOUS | Status: AC
  Administered 2013-10-12: 16 mg via INTRAVENOUS

## 2013-10-12 MED ORDER — SODIUM CHLORIDE 0.9 % IV SOLN
Freq: Once | INTRAVENOUS | Status: AC
  Administered 2013-10-12: 13:00:00 via INTRAVENOUS

## 2013-10-12 MED ORDER — SODIUM CHLORIDE 0.9 % IJ SOLN
10.0000 mL | INTRAMUSCULAR | Status: DC | PRN
  Administered 2013-10-12: 10 mL
  Filled 2013-10-12: qty 10

## 2013-10-12 MED ORDER — LIDOCAINE-PRILOCAINE 2.5-2.5 % EX CREA: 1.0000 "application " | TOPICAL_CREAM | CUTANEOUS | Status: AC | PRN

## 2013-10-12 MED ORDER — ONDANSETRON 16 MG/50ML IVPB (CHCC)
INTRAVENOUS | Status: AC
  Filled 2013-10-12: qty 16

## 2013-10-12 MED ORDER — ONDANSETRON HCL 8 MG PO TABS: 8.0000 mg | ORAL_TABLET | Freq: Two times a day (BID) | ORAL | Status: DC

## 2013-10-12 MED ORDER — PROCHLORPERAZINE MALEATE 5 MG PO TABS: 5.0000 mg | ORAL_TABLET | Freq: Three times a day (TID) | ORAL | Status: DC

## 2013-10-12 MED ORDER — CARBOPLATIN CHEMO INTRADERMAL TEST DOSE 100MCG/0.02ML
100.0000 ug | Freq: Once | INTRADERMAL | Status: AC
  Administered 2013-10-12: 100 ug via INTRADERMAL
  Filled 2013-10-12: qty 0.01

## 2013-10-12 MED ORDER — SODIUM CHLORIDE 0.9 % IV SOLN
261.2000 mg | Freq: Once | INTRAVENOUS | Status: AC
  Administered 2013-10-12: 260 mg via INTRAVENOUS
  Filled 2013-10-12: qty 26

## 2013-10-12 NOTE — Progress Notes (Signed)
[place] OFFICE PROGRESS NOTE   PCP: [name], NP GYN: SU:  OTHER MD: Tyler Pita  No chief complaint on file.   Chief complaint: "MY lung cancer is back"  Lung cancer history: From Dr. Rolan Bucco  07/30/2012 note:  "1. 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  2. Squamous cell carcinoma of the right lower lobe T1 resected at the  time of surgery on 08/09/2005. All resected lymph nodes were  negative.  3. Non small cell lung cancer of the right upper lobe detected in March  2012, +NABx 09/21/10, with positive PET scan treated with stereotactic  radiation 5000 cGy in 5 fractions from 10/17/2010 through 10/27/2010.  4. Lingular nodule which appears to be increasing in size. This  lesion may have been present on a CT scan of the chest going back  to 08/30/2010. A PET scan carried out on 03/14/2012 showed no evidence of metastatic disease. Core needle biopsy of the lingular nodule was carried out on 04/01/2012 and showed invasive poorly differentiated carcinoma with predominant small cell carcinoma component and minor squamous cell component. Stains for p63, chromogranin and synaptophysin were diffusely positive. Cytokeratin 5/6 was positive in the squamous cell carcinoma component, but negative in the rest of the tumor. The small cell component comprised about 90%  of the tumor. Slides were reviewed with Dr. Lynnell Chad. The lesion was  treated with stereotactic body radiotherapy (SBRT), 18 Gy per session, for  a total of 3 sessions, administered on 04/22/2012, 04/24/2012, and 04/29/2012, for a total of 54 Gy. The patient started chemotherapy with carboplatin and VP-16 on 05/19/2012 for a planned 4 cycles. Cycle #4 was  completed on 07/31/2012."  More recently the patient was evaluated by Dr.Khan, after the patient presented to the emergency room with a cough and chest pain and found to have several cracked ribs. A CT angiogram found a right lower lobe nodule concerning for recurrence and she was referred back to oncology for further evaluation. PET scan 07/03/2013 showed, aside from 2 right lung nodules which were nonspecific, new hypermetabolic activity within the head of the pancreas. MRI of the abdomen obtained 07/24/2013 confirmed a soft tissue mass centered at the ventral aspect of the pancreatic head measuring 3.3 cm, as well as innumerable liver lesions which were new as compared to July of 2014. On 08/28/2013 the patient underwent right liver lobe biopsy, which showed ([phone]) a metastatic poorly differentiated neuroendocrine carcinoma which was positive for CD 56, chromogranin, and synaptophysin. TTF-1 was positive, consistent with a lung primary.  The patient's subsequent history is as detailed below   INTERVAL HISTORY: [name] returns today for followup of her recurrent lung cancer, accompanied by her daughter [name]. Since her last visit here the patient her her port place, without event.   REVIEW OF SYSTEMS:   She denies unusual headaches, visual changes, dizziness, nausea or vomiting. She has a mild dry cough at times, but her breathing is no better or worse than usual. She developed a rash from the port tape. This is resolving. Otherwise a detailed review of systems today was very stable    MEDICAL HISTORY: Past Medical History  Diagnosis Date  . Cancer     lung ca, RUL  . CHF (congestive heart failure)   .  Hypertension   . Hx pulmonary embolism 03/2011    on coumadin till 02/2011,stopped  due to gi bleed  . GERD (gastroesophageal reflux disease)   . GI bleed   . Colitis     history  . Chest pain 06/30/11    hx blood clot in lung  . Bell's palsy   . PVD (peripheral vascular disease)    . Lung cancer     metachronous stage I nscca RUL  . History of radiation therapy 5/8/10th,14th,16th,and 10/27/2010    RUL lung nscca  . Blood transfusion     during lumbar surgery  . History of chemotherapy 2006-207    carboplatin/VP16 last January 207  . Clotting disorder     DVT/ PE  . Lung cancer 04/01/12    biopsy-lingular nodule-inv carcinoma, minor squamous cell ca  . History of radiation therapy 11/12, 11/14, 04/29/2012    lingular -squamous cell carcinoma, nodule  . Paroxysmal atrial fibrillation   . On continuous oral anticoagulation   . Diabetes mellitus without complication     patient states "diet controlled."   ALLERGIES:  is allergic to aspirin.  MEDICATIONS: has a current medication list which includes the following prescription(s): acetaminophen, alendronate, amlodipine, benazepril, cimetidine, diphenhydramine-acetaminophen, fenofibrate, furosemide, methocarbamol, metoprolol tartrate, omega-3 acid ethyl esters, oxycodone-acetaminophen, pantoprazole, ropinirole, sennosides-docusate sodium, simvastatin, tiotropium, and warfarin.  SURGICAL HISTORY:  Past Surgical History  Procedure Laterality Date  . Right lower lobe lung resection  2007  . Cataract surgery      b/l  lens implants  . Abdominal hysterectomy      partial age 42  . Shoulder open rotator cuff repair  04/2003    left  . Appendectomy    . Carpal tunnel release    . Bladder surgery    . Lumbar spine surgery      x3, L4-S1  . Right lung biopsy  2006  . Coronary angioplasty with stent placement  2001    renal stent  . 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 She is a widow and lives with her daughter [name], who is herself disabled. A daily as a history of breast cancer dating back to 2005 and apparently developed congestive heart failure. The patient has 2 other children, her son [name]  who lives in [place] and is disabled secondary to back pain and Jasmine Awe and "has an anger problem". The patient has 2 biological and 2 Vicente Serene the grandchildren and 4 great-grandchildren. She is a Psychologist, forensic.  ADVANCED DIRECTIVES: On her 10/02/2013 visit the patient was given healthcare power of attorney documents which she completed and notarized. She has named her daughter [name] as her healthcare power of attorney. Ideally I can be reached at the patient's home phone. At that same visit we also discussed end-of-life issues. The patient is very optimistic and wishes to have any treatment that may prolong her life or make her life better. She agrees that in case of a terminal event she should not be "placed on machines or life support". Her daughter was present during this discussion.  PHYSICAL EXAMINATION: BP 121/62  Pulse 108  Temp(Src) 98.4 F (36.9 C) (Oral)  Resp 20  Ht 5\' 4"  (1.626 m)  Wt 213 lb 11.2 oz (96.934 kg)  BMI 36.66 kg/m2  ECOG PERFORMANCE STATUS: 2 - Symptomatic, <50% confined to bed  Sclerae unicteric Oropharynx clear and moist-- oxygen by nasal cannula In place  No cervical or supraclavicular adenopathy Lungs no rales or rhonchi, no wheezes  Heart regular rate and rhythm Abd soft, obese, nontender, positive bowel sounds MSK no focal spinal tenderness Neuro: nonfocal, appropriate affect Breasts: Deferred    LABORATORY DATA: Results for orders placed in visit on 10/12/13 (from the past 48 hour(s))  CBC WITH DIFFERENTIAL     Status: Abnormal   Collection Time    10/12/13  8:12 AM      Result Value Ref Range   WBC 8.4  3.9 - 10.3 10e3/uL   NEUT# 5.9  1.5 - 6.5 10e3/uL   HGB 12.4  11.6 - 15.9 g/dL   HCT 37.4  34.8 - 46.6 %   Platelets 179  145 - 400 10e3/uL   MCV 91.5  79.5 - 101.0 fL   MCH 30.3  25.1 - 34.0 pg   MCHC 33.2  31.5 - 36.0 g/dL   RBC 4.09  3.70 - 5.45 10e6/uL   RDW 15.1 (*) 11.2 - 14.5 %   lymph# 1.3  0.9 - 3.3  10e3/uL   MONO# 1.0 (*) 0.1 - 0.9 10e3/uL   Eosinophils Absolute 0.2  0.0 - 0.5 10e3/uL   Basophils Absolute 0.0  0.0 - 0.1 10e3/uL   NEUT% 70.0  38.4 - 76.8 %   LYMPH% 15.5  14.0 - 49.7 %   MONO% 11.7  0.0 - 14.0 %   EOS% 2.3  0.0 - 7.0 %   BASO% 0.5  0.0 - 2.0 %    RADIOGRAPHIC STUDIES: Ir Fluoro Guide Cv Line Right  10/09/2013   CLINICAL DATA:  Lung carcinoma and need for porta cath to begin chemotherapy.  EXAM: IMPLANTED PORT A CATH PLACEMENT WITH ULTRASOUND AND FLUOROSCOPIC GUIDANCE  ANESTHESIA/SEDATION: 2.0 Mg IV Versed; 50 mcg IV Fentanyl  Total Moderate Sedation Time:  30 minutes  Additional Medications: 2 g IV Ancef. As antibiotic prophylaxis, Ancef was ordered pre-procedure and administered intravenously within one hour of incision.  FLUOROSCOPY TIME:  30 seconds.  PROCEDURE: The procedure, risks, benefits, and alternatives were explained to the patient. Questions regarding the procedure were encouraged and answered. The patient understands and consents to the procedure.  The right neck and chest were prepped with chlorhexidine in a sterile fashion, and a sterile drape was applied covering the operative field. Maximum barrier sterile technique with sterile gowns and gloves were used for the procedure. Local anesthesia was provided with 1% lidocaine. Ultrasound was used to confirm patency of the right internal jugular vein.  After creating a small venotomy incision, a 21 gauge needle was advanced into the right internal jugular vein under direct, real-time ultrasound guidance. Ultrasound image documentation was performed. After securing guidewire access, an 8 Fr dilator was placed. A J-wire was kinked to measure appropriate catheter length.  A subcutaneous port pocket was then created along the upper chest wall utilizing sharp and blunt dissection. Portable cautery was utilized. The pocket was irrigated with sterile saline.  A single lumen power injectable port was chosen for placement. The 8 Fr  catheter was tunneled from the port pocket site to the venotomy incision. The port was placed in the pocket. External catheter was trimmed to appropriate length based on guidewire measurement.  At the venotomy, an 8 Fr peel-away sheath was placed over a guidewire. The catheter was then placed through the sheath and the sheath removed. Final catheter positioning was confirmed and documented with a  fluoroscopic spot image. The port was accessed with a needle and aspirated and flushed with heparinized saline. The needle was removed.  The venotomy and port pocket incisions were closed with subcutaneous 3-0 Monocryl and subcuticular 4-0 Vicryl. Dermabond was applied to both incisions.  Complications: None. No pneumothorax.  FINDINGS: After catheter placement, the tip lies at the cavoatrial junction. The catheter aspirates normally and is ready for immediate use.  IMPRESSION: Placement of single lumen port a cath via right internal jugular vein. The catheter tip lies at the cavoatrial junction. A power injectable port a cath was placed and is ready for immediate use.   Electronically Signed   By: Aletta Edouard M.D.   On: 10/09/2013 14:07   Ir US Guide Vasc Access Right  10/09/2013   CLINICAL DATA:  Lung carcinoma and need for porta cath to begin chemotherapy.  EXAM: IMPLANTED PORT A CATH PLACEMENT WITH ULTRASOUND AND FLUOROSCOPIC GUIDANCE  ANESTHESIA/SEDATION: 2.0 Mg IV Versed; 50 mcg IV Fentanyl  Total Moderate Sedation Time:  30 minutes  Additional Medications: 2 g IV Ancef. As antibiotic prophylaxis, Ancef was ordered pre-procedure and administered intravenously within one hour of incision.  FLUOROSCOPY TIME:  30 seconds.  PROCEDURE: The procedure, risks, benefits, and alternatives were explained to the patient. Questions regarding the procedure were encouraged and answered. The patient understands and consents to the procedure.  The right neck and chest were prepped with chlorhexidine in a sterile fashion, and a  sterile drape was applied covering the operative field. Maximum barrier sterile technique with sterile gowns and gloves were used for the procedure. Local anesthesia was provided with 1% lidocaine. Ultrasound was used to confirm patency of the right internal jugular vein.  After creating a small venotomy incision, a 21 gauge needle was advanced into the right internal jugular vein under direct, real-time ultrasound guidance. Ultrasound image documentation was performed. After securing guidewire access, an 8 Fr dilator was placed. A J-wire was kinked to measure appropriate catheter length.  A subcutaneous port pocket was then created along the upper chest wall utilizing sharp and blunt dissection. Portable cautery was utilized. The pocket was irrigated with sterile saline.  A single lumen power injectable port was chosen for placement. The 8 Fr catheter was tunneled from the port pocket site to the venotomy incision. The port was placed in the pocket. External catheter was trimmed to appropriate length based on guidewire measurement.  At the venotomy, an 8 Fr peel-away sheath was placed over a guidewire. The catheter was then placed through the sheath and the sheath removed. Final catheter positioning was confirmed and documented with a fluoroscopic spot image. The port was accessed with a needle and aspirated and flushed with heparinized saline. The needle was removed.  The venotomy and port pocket incisions were closed with subcutaneous 3-0 Monocryl and subcuticular 4-0 Vicryl. Dermabond was applied to both incisions.  Complications: None. No pneumothorax.  FINDINGS: After catheter placement, the tip lies at the cavoatrial junction. The catheter aspirates normally and is ready for immediate use.  IMPRESSION: Placement of single lumen port a cath via right internal jugular vein. The catheter tip lies at the cavoatrial junction. A power injectable port a cath was placed and is ready for immediate use.    Electronically Signed   By: Aletta Edouard M.D.   On: 10/09/2013 14:07      ASSESSMENT: 80 y.o. [place], [place] woman   (1) with a history of neuroendocrine lung cancer (small cell close(biopsied November of  2006, and treated with carboplatin and VP-16 between November of 2006 and January of 2007, followed by right lower lobe lobectomy March of 2007  (2) squamous cell carcinoma of the right lower lobe, T1 N0, resected at the same time as above  (3) non-small cell lung cancer of the right upper lobe diagnosed April of 2012, treated with stereotactic radiation (50 gray in 5 fractions) completed may of 2012  (4) metastatic neuroendocrine lung cancer (recurrence from #1 above) diagnosed by liver biopsy 09/05/2013  PLAN:  Today we reviewed her nausea medications, which will be simplified. She will not receive Ativan her dexamethasone. She will use Zofran and prochlorperazine, and in particular of the prochlorperazine will be at reduced doses. I am also of course reducing her chemotherapy doses.  Lanaiya and her daughter understand that this is not a curable disease. She is interested in chemotherapy because it may prolong her life. What I would not want to do is shorten her life by provoking some bleeding or infectious problems or other issues related to chemotherapy reactions.  Accordingly her chemotherapy doses have been reduced. If she tolerates this well we will consider increasing in particular the carboplatin at the next cycle.  If there has not been a response or at least disease control after 2 cycles, I will recommend a palliative/hospice approach.  Avni and her daughter have a good understanding of the overall plan. Agree with it. They know the goal of treatment in this case is control and not cure. They will call with any problems that may develop before the next visit here.  Chauncey Cruel, MD 10/12/2013 8:46 AM

## 2013-10-12 NOTE — Patient Instructions (Addendum)
[facility] Discharge Instructions for Patients Receiving Chemotherapy  Today you received the following chemotherapy agents Carboplatin/VP 16 To help prevent nausea and vomiting after your treatment, we encourage you to take your nausea medication as prescribed.   If you develop nausea and vomiting that is not controlled by your nausea medication, call the clinic.   BELOW ARE SYMPTOMS THAT SHOULD BE REPORTED IMMEDIATELY:  *FEVER GREATER THAN 100.5 F  *CHILLS WITH OR WITHOUT FEVER  NAUSEA AND VOMITING THAT IS NOT CONTROLLED WITH YOUR NAUSEA MEDICATION  *UNUSUAL SHORTNESS OF BREATH  *UNUSUAL BRUISING OR BLEEDING  TENDERNESS IN MOUTH AND THROAT WITH OR WITHOUT PRESENCE OF ULCERS  *URINARY PROBLEMS  *BOWEL PROBLEMS  UNUSUAL RASH Items with * indicate a potential emergency and should be followed up as soon as possible.  Feel free to call the clinic you have any questions or concerns. The clinic phone number is [phone].    Carboplatin injection What is this medicine? CARBOPLATIN (KAR boe pla tin) is a chemotherapy drug. It targets fast dividing cells, like cancer cells, and causes these cells to die. This medicine is used to treat ovarian cancer and many other cancers. This medicine may be used for other purposes; ask your health care provider or pharmacist if you have questions. COMMON BRAND NAME(S): Paraplatin What should I tell my health care provider before I take this medicine? They need to know if you have any of these conditions: -blood disorders -hearing problems -kidney disease -recent or ongoing radiation therapy -an unusual or allergic reaction to carboplatin, cisplatin, other chemotherapy, other medicines, foods, dyes, or preservatives -pregnant or trying to get pregnant -breast-feeding How should I use this medicine? This drug is usually given as an infusion into a vein. It is administered in a hospital or clinic by a specially trained  health care professional. Talk to your pediatrician regarding the use of this medicine in children. Special care may be needed. Overdosage: If you think you have taken too much of this medicine contact a poison control center or emergency room at once. NOTE: This medicine is only for you. Do not share this medicine with others. What if I miss a dose? It is important not to miss a dose. Call your doctor or health care professional if you are unable to keep an appointment. What may interact with this medicine? -medicines for seizures -medicines to increase blood counts like filgrastim, pegfilgrastim, sargramostim -some antibiotics like amikacin, gentamicin, neomycin, streptomycin, tobramycin -vaccines Talk to your doctor or health care professional before taking any of these medicines: -acetaminophen -aspirin -ibuprofen -ketoprofen -naproxen This list may not describe all possible interactions. Give your health care provider a list of all the medicines, herbs, non-prescription drugs, or dietary supplements you use. Also tell them if you smoke, drink alcohol, or use illegal drugs. Some items may interact with your medicine. What should I watch for while using this medicine? Your condition will be monitored carefully while you are receiving this medicine. You will need important blood work done while you are taking this medicine. This drug may make you feel generally unwell. This is not uncommon, as chemotherapy can affect healthy cells as well as cancer cells. Report any side effects. Continue your course of treatment even though you feel ill unless your doctor tells you to stop. In some cases, you may be given additional medicines to help with side effects. Follow all directions for their use. Call your doctor or health care professional for advice if you  get a fever, chills or sore throat, or other symptoms of a cold or flu. Do not treat yourself. This drug decreases your body's ability to fight  infections. Try to avoid being around people who are sick. This medicine may increase your risk to bruise or bleed. Call your doctor or health care professional if you notice any unusual bleeding. Be careful brushing and flossing your teeth or using a toothpick because you may get an infection or bleed more easily. If you have any dental work done, tell your dentist you are receiving this medicine. Avoid taking products that contain aspirin, acetaminophen, ibuprofen, naproxen, or ketoprofen unless instructed by your doctor. These medicines may hide a fever. Do not become pregnant while taking this medicine. Women should inform their doctor if they wish to become pregnant or think they might be pregnant. There is a potential for serious side effects to an unborn child. Talk to your health care professional or pharmacist for more information. Do not breast-feed an infant while taking this medicine. What side effects may I notice from receiving this medicine? Side effects that you should report to your doctor or health care professional as soon as possible: -allergic reactions like skin rash, itching or hives, swelling of the face, lips, or tongue -signs of infection - fever or chills, cough, sore throat, pain or difficulty passing urine -signs of decreased platelets or bleeding - bruising, pinpoint red spots on the skin, black, tarry stools, nosebleeds -signs of decreased red blood cells - unusually weak or tired, fainting spells, lightheadedness -breathing problems -changes in hearing -changes in vision -chest pain -high blood pressure -low blood counts - This drug may decrease the number of white blood cells, red blood cells and platelets. You may be at increased risk for infections and bleeding. -nausea and vomiting -pain, swelling, redness or irritation at the injection site -pain, tingling, numbness in the hands or feet -problems with balance, talking, walking -trouble passing urine or change  in the amount of urine Side effects that usually do not require medical attention (report to your doctor or health care professional if they continue or are bothersome): -hair loss -loss of appetite -metallic taste in the mouth or changes in taste This list may not describe all possible side effects. Call your doctor for medical advice about side effects. You may report side effects to FDA at 1-800-FDA-1088. Where should I keep my medicine? This drug is given in a hospital or clinic and will not be stored at home. NOTE: This sheet is a summary. It may not cover all possible information. If you have questions about this medicine, talk to your doctor, pharmacist, or health care provider.  2014, [employer]. (2007-09-02 14:38:05)   Etoposide, VP-16 injection What is this medicine? ETOPOSIDE, VP-16 (e toe POE side) is a chemotherapy drug. It is used to treat testicular cancer, lung cancer, and other cancers. This medicine may be used for other purposes; ask your health care provider or pharmacist if you have questions. COMMON BRAND NAME(S): Etopophos, Toposar, VePesid What should I tell my health care provider before I take this medicine? They need to know if you have any of these conditions: -infection -kidney disease -low blood counts, like low white cell, platelet, or red cell counts -an unusual or allergic reaction to etoposide, other chemotherapeutic agents, other medicines, foods, dyes, or preservatives -pregnant or trying to get pregnant -breast-feeding How should I use this medicine? This medicine is for infusion into a vein. It is administered in  a hospital or clinic by a specially trained health care professional. Talk to your pediatrician regarding the use of this medicine in children. Special care may be needed. Overdosage: If you think you have taken too much of this medicine contact a poison control center or emergency room at once. NOTE: This medicine is only for  you. Do not share this medicine with others. What if I miss a dose? It is important not to miss your dose. Call your doctor or health care professional if you are unable to keep an appointment. What may interact with this medicine? -cyclosporine -medicines to increase blood counts like filgrastim, pegfilgrastim, sargramostim -vaccines This list may not describe all possible interactions. Give your health care provider a list of all the medicines, herbs, non-prescription drugs, or dietary supplements you use. Also tell them if you smoke, drink alcohol, or use illegal drugs. Some items may interact with your medicine. What should I watch for while using this medicine? Visit your doctor for checks on your progress. This drug may make you feel generally unwell. This is not uncommon, as chemotherapy can affect healthy cells as well as cancer cells. Report any side effects. Continue your course of treatment even though you feel ill unless your doctor tells you to stop. In some cases, you may be given additional medicines to help with side effects. Follow all directions for their use. Call your doctor or health care professional for advice if you get a fever, chills or sore throat, or other symptoms of a cold or flu. Do not treat yourself. This drug decreases your body's ability to fight infections. Try to avoid being around people who are sick. This medicine may increase your risk to bruise or bleed. Call your doctor or health care professional if you notice any unusual bleeding. Be careful brushing and flossing your teeth or using a toothpick because you may get an infection or bleed more easily. If you have any dental work done, tell your dentist you are receiving this medicine. Avoid taking products that contain aspirin, acetaminophen, ibuprofen, naproxen, or ketoprofen unless instructed by your doctor. These medicines may hide a fever. Do not become pregnant while taking this medicine. Women should  inform their doctor if they wish to become pregnant or think they might be pregnant. There is a potential for serious side effects to an unborn child. Talk to your health care professional or pharmacist for more information. Do not breast-feed an infant while taking this medicine. What side effects may I notice from receiving this medicine? Side effects that you should report to your doctor or health care professional as soon as possible: -allergic reactions like skin rash, itching or hives, swelling of the face, lips, or tongue -low blood counts - this medicine may decrease the number of white blood cells, red blood cells and platelets. You may be at increased risk for infections and bleeding. -signs of infection - fever or chills, cough, sore throat, pain or difficulty passing urine -signs of decreased platelets or bleeding - bruising, pinpoint red spots on the skin, black, tarry stools, blood in the urine -signs of decreased red blood cells - unusually weak or tired, fainting spells, lightheadedness -breathing problems -changes in vision -mouth or throat sores or ulcers -pain, redness, swelling or irritation at the injection site -pain, tingling, numbness in the hands or feet -redness, blistering, peeling or loosening of the skin, including inside the mouth -seizures -vomiting Side effects that usually do not require medical attention (report to your  doctor or health care professional if they continue or are bothersome): -diarrhea -hair loss -loss of appetite -nausea -stomach pain This list may not describe all possible side effects. Call your doctor for medical advice about side effects. You may report side effects to FDA at 1-800-FDA-1088. Where should I keep my medicine? This drug is given in a hospital or clinic and will not be stored at home. NOTE: This sheet is a summary. It may not cover all possible information. If you have questions about this medicine, talk to your doctor,  pharmacist, or health care provider.  2014, [employer] Standard. (2007-09-29 17:24:12)

## 2013-10-13 ENCOUNTER — Ambulatory Visit (HOSPITAL_BASED_OUTPATIENT_CLINIC_OR_DEPARTMENT_OTHER): Payer: Medicare Other

## 2013-10-13 ENCOUNTER — Telehealth: Payer: Self-pay

## 2013-10-13 DIAGNOSIS — C3490 Malignant neoplasm of unspecified part of unspecified bronchus or lung: Secondary | ICD-10-CM

## 2013-10-13 DIAGNOSIS — C7A090 Malignant carcinoid tumor of the bronchus and lung: Secondary | ICD-10-CM

## 2013-10-13 DIAGNOSIS — C787 Secondary malignant neoplasm of liver and intrahepatic bile duct: Secondary | ICD-10-CM

## 2013-10-13 DIAGNOSIS — Z5111 Encounter for antineoplastic chemotherapy: Secondary | ICD-10-CM

## 2013-10-13 MED ORDER — PROCHLORPERAZINE MALEATE 10 MG PO TABS
ORAL_TABLET | ORAL | Status: AC
  Filled 2013-10-13: qty 1

## 2013-10-13 MED ORDER — SODIUM CHLORIDE 0.9 % IV SOLN
80.0000 mg/m2 | Freq: Once | INTRAVENOUS | Status: AC
  Administered 2013-10-13: 170 mg via INTRAVENOUS
  Filled 2013-10-13: qty 8.5

## 2013-10-13 MED ORDER — PROCHLORPERAZINE MALEATE 10 MG PO TABS
10.0000 mg | ORAL_TABLET | Freq: Once | ORAL | Status: AC
  Administered 2013-10-13: 10 mg via ORAL

## 2013-10-13 MED ORDER — HEPARIN SOD (PORK) LOCK FLUSH 100 UNIT/ML IV SOLN
500.0000 [IU] | Freq: Once | INTRAVENOUS | Status: AC | PRN
  Administered 2013-10-13: 500 [IU]
  Filled 2013-10-13: qty 5

## 2013-10-13 MED ORDER — SODIUM CHLORIDE 0.9 % IV SOLN
Freq: Once | INTRAVENOUS | Status: AC
  Administered 2013-10-13: 15:00:00 via INTRAVENOUS

## 2013-10-13 MED ORDER — SODIUM CHLORIDE 0.9 % IJ SOLN
10.0000 mL | INTRAMUSCULAR | Status: DC | PRN
  Administered 2013-10-13: 10 mL
  Filled 2013-10-13: qty 10

## 2013-10-13 NOTE — Patient Instructions (Signed)
[facility] Discharge Instructions for Patients Receiving Chemotherapy  Today you received the following chemotherapy agents: Etoposide  To help prevent nausea and vomiting after your treatment, we encourage you to take your nausea medication as directed: Compazine 5 mg: Take 1 tablet (5 mg total) by mouth 4 (four) times daily - before meals and at bedtime    If you develop nausea and vomiting that is not controlled by your nausea medication, call the clinic.   BELOW ARE SYMPTOMS THAT SHOULD BE REPORTED IMMEDIATELY:  *FEVER GREATER THAN 100.5 F  *CHILLS WITH OR WITHOUT FEVER  NAUSEA AND VOMITING THAT IS NOT CONTROLLED WITH YOUR NAUSEA MEDICATION  *UNUSUAL SHORTNESS OF BREATH  *UNUSUAL BRUISING OR BLEEDING  TENDERNESS IN MOUTH AND THROAT WITH OR WITHOUT PRESENCE OF ULCERS  *URINARY PROBLEMS  *BOWEL PROBLEMS  UNUSUAL RASH Items with * indicate a potential emergency and should be followed up as soon as possible.  Feel free to call the clinic you have any questions or concerns. The clinic phone number is [phone].

## 2013-10-13 NOTE — Telephone Encounter (Signed)
ENCOUNTER OPENED IN ERROR

## 2013-10-13 NOTE — Telephone Encounter (Deleted)
Message copied by Baruch Merl on Tue Oct 13, 2013  2:19 PM ------      Message from: Perlie Gold      Created: Mon Oct 12, 2013  3:45 PM      Regarding: Chemo follow up call      Contact: [phone]       First time Carboplatin/ VP 16. Dr. Jana Hakim patient. Please call.            Thanks,      Barnetta Chapel, RN ------

## 2013-10-13 NOTE — Progress Notes (Signed)
Okay to tx per Dr. Jana Hakim with 10/12/13 labs, [id number].  Patient reports no changes from yesterday's D1C1 treatment. Chemo follow up done in infusion rm.  No questions or concerns. Has been able to eat and drink as normal. No nausea, vomiting, or diarrhea. Infusion completed without difficulty.

## 2013-10-14 ENCOUNTER — Other Ambulatory Visit: Payer: Self-pay

## 2013-10-14 ENCOUNTER — Ambulatory Visit: Payer: Medicare Other

## 2013-10-14 ENCOUNTER — Inpatient Hospital Stay (HOSPITAL_COMMUNITY)
Admission: EM | Admit: 2013-10-14 | Discharge: 2013-10-17 | DRG: 394 | Disposition: A | Discharge status: Home / Self Care | Payer: Medicare Other | Attending: Internal Medicine | Admitting: Internal Medicine

## 2013-10-14 ENCOUNTER — Encounter (HOSPITAL_COMMUNITY): Payer: Self-pay | Admitting: Emergency Medicine

## 2013-10-14 ENCOUNTER — Emergency Department (HOSPITAL_COMMUNITY): Payer: Medicare Other

## 2013-10-14 DIAGNOSIS — E785 Hyperlipidemia, unspecified: Secondary | ICD-10-CM

## 2013-10-14 DIAGNOSIS — Z9221 Personal history of antineoplastic chemotherapy: Secondary | ICD-10-CM

## 2013-10-14 DIAGNOSIS — E119 Type 2 diabetes mellitus without complications: Secondary | ICD-10-CM | POA: Diagnosis present

## 2013-10-14 DIAGNOSIS — Z9861 Coronary angioplasty status: Secondary | ICD-10-CM

## 2013-10-14 DIAGNOSIS — K642 Third degree hemorrhoids: Secondary | ICD-10-CM

## 2013-10-14 DIAGNOSIS — Z923 Personal history of irradiation: Secondary | ICD-10-CM

## 2013-10-14 DIAGNOSIS — Z86718 Personal history of other venous thrombosis and embolism: Secondary | ICD-10-CM

## 2013-10-14 DIAGNOSIS — Z66 Do not resuscitate: Secondary | ICD-10-CM | POA: Diagnosis present

## 2013-10-14 DIAGNOSIS — K644 Residual hemorrhoidal skin tags: Principal | ICD-10-CM | POA: Diagnosis present

## 2013-10-14 DIAGNOSIS — I4891 Unspecified atrial fibrillation: Secondary | ICD-10-CM | POA: Diagnosis present

## 2013-10-14 DIAGNOSIS — I739 Peripheral vascular disease, unspecified: Secondary | ICD-10-CM

## 2013-10-14 DIAGNOSIS — C801 Malignant (primary) neoplasm, unspecified: Secondary | ICD-10-CM

## 2013-10-14 DIAGNOSIS — I480 Paroxysmal atrial fibrillation: Secondary | ICD-10-CM | POA: Diagnosis present

## 2013-10-14 DIAGNOSIS — Z87891 Personal history of nicotine dependence: Secondary | ICD-10-CM

## 2013-10-14 DIAGNOSIS — J449 Chronic obstructive pulmonary disease, unspecified: Secondary | ICD-10-CM | POA: Diagnosis present

## 2013-10-14 DIAGNOSIS — I129 Hypertensive chronic kidney disease with stage 1 through stage 4 chronic kidney disease, or unspecified chronic kidney disease: Secondary | ICD-10-CM | POA: Diagnosis present

## 2013-10-14 DIAGNOSIS — I10 Essential (primary) hypertension: Secondary | ICD-10-CM

## 2013-10-14 DIAGNOSIS — J441 Chronic obstructive pulmonary disease with (acute) exacerbation: Secondary | ICD-10-CM

## 2013-10-14 DIAGNOSIS — C3410 Malignant neoplasm of upper lobe, unspecified bronchus or lung: Secondary | ICD-10-CM | POA: Diagnosis present

## 2013-10-14 DIAGNOSIS — Z7901 Long term (current) use of anticoagulants: Secondary | ICD-10-CM

## 2013-10-14 DIAGNOSIS — K648 Other hemorrhoids: Secondary | ICD-10-CM | POA: Diagnosis present

## 2013-10-14 DIAGNOSIS — J4489 Other specified chronic obstructive pulmonary disease: Secondary | ICD-10-CM | POA: Diagnosis present

## 2013-10-14 DIAGNOSIS — N189 Chronic kidney disease, unspecified: Secondary | ICD-10-CM | POA: Diagnosis present

## 2013-10-14 DIAGNOSIS — C787 Secondary malignant neoplasm of liver and intrahepatic bile duct: Secondary | ICD-10-CM | POA: Diagnosis present

## 2013-10-14 DIAGNOSIS — R0602 Shortness of breath: Secondary | ICD-10-CM

## 2013-10-14 DIAGNOSIS — C7889 Secondary malignant neoplasm of other digestive organs: Secondary | ICD-10-CM | POA: Diagnosis present

## 2013-10-14 DIAGNOSIS — D689 Coagulation defect, unspecified: Secondary | ICD-10-CM | POA: Diagnosis present

## 2013-10-14 DIAGNOSIS — K922 Gastrointestinal hemorrhage, unspecified: Secondary | ICD-10-CM

## 2013-10-14 DIAGNOSIS — I5030 Unspecified diastolic (congestive) heart failure: Secondary | ICD-10-CM | POA: Diagnosis present

## 2013-10-14 DIAGNOSIS — I509 Heart failure, unspecified: Secondary | ICD-10-CM

## 2013-10-14 DIAGNOSIS — K219 Gastro-esophageal reflux disease without esophagitis: Secondary | ICD-10-CM

## 2013-10-14 DIAGNOSIS — Z9849 Cataract extraction status, unspecified eye: Secondary | ICD-10-CM

## 2013-10-14 DIAGNOSIS — C3490 Malignant neoplasm of unspecified part of unspecified bronchus or lung: Secondary | ICD-10-CM

## 2013-10-14 DIAGNOSIS — Z79899 Other long term (current) drug therapy: Secondary | ICD-10-CM

## 2013-10-14 DIAGNOSIS — I959 Hypotension, unspecified: Secondary | ICD-10-CM

## 2013-10-14 DIAGNOSIS — Z86711 Personal history of pulmonary embolism: Secondary | ICD-10-CM

## 2013-10-14 LAB — COMPREHENSIVE METABOLIC PANEL
ALK PHOS: 54 U/L (ref 39–117)
ALT: 22 U/L (ref 0–35)
AST: 277 U/L — ABNORMAL HIGH (ref 0–37)
Albumin: 3.3 g/dL — ABNORMAL LOW (ref 3.5–5.2)
BILIRUBIN TOTAL: 0.4 mg/dL (ref 0.3–1.2)
BUN: 38 mg/dL — AB (ref 6–23)
CO2: 24 mEq/L (ref 19–32)
Calcium: 9.9 mg/dL (ref 8.4–10.5)
Chloride: 94 mEq/L — ABNORMAL LOW (ref 96–112)
Creatinine, Ser: 1.69 mg/dL — ABNORMAL HIGH (ref 0.50–1.10)
GFR calc non Af Amer: 27 mL/min — ABNORMAL LOW (ref >90)
GFR, EST AFRICAN AMERICAN: 32 mL/min — AB (ref >90)
Glucose, Bld: 118 mg/dL — ABNORMAL HIGH (ref 70–99)
Potassium: 4.8 mEq/L (ref 3.7–5.3)
Sodium: 136 mEq/L — ABNORMAL LOW (ref 137–147)
Total Protein: 7.1 g/dL (ref 6.0–8.3)

## 2013-10-14 LAB — CBC
HCT: 37.4 % (ref 36.0–46.0)
Hemoglobin: 12.3 g/dL (ref 12.0–15.0)
MCH: 30.2 pg (ref 26.0–34.0)
MCHC: 32.9 g/dL (ref 30.0–36.0)
MCV: 91.9 fL (ref 78.0–100.0)
PLATELETS: 212 10*3/uL (ref 150–400)
RBC: 4.07 MIL/uL (ref 3.87–5.11)
RDW: 15 % (ref 11.5–15.5)
WBC: 10.6 10*3/uL — ABNORMAL HIGH (ref 4.0–10.5)

## 2013-10-14 LAB — PROTIME-INR
INR: 1.39 (ref 0.00–1.49)
Prothrombin Time: 16.7 seconds — ABNORMAL HIGH (ref 11.6–15.2)

## 2013-10-14 LAB — I-STAT CG4 LACTIC ACID, ED: Lactic Acid, Venous: 1.7 mmol/L (ref 0.5–2.2)

## 2013-10-14 MED ORDER — SODIUM CHLORIDE 0.9 % IV SOLN
INTRAVENOUS | Status: DC
  Administered 2013-10-14: 100 mL/h via INTRAVENOUS

## 2013-10-14 MED ORDER — SODIUM CHLORIDE 0.9 % IV BOLUS (SEPSIS)
1000.0000 mL | Freq: Once | INTRAVENOUS | Status: AC
  Administered 2013-10-14: 1000 mL via INTRAVENOUS

## 2013-10-14 MED ORDER — SODIUM CHLORIDE 0.9 % IV BOLUS (SEPSIS)
500.0000 mL | Freq: Once | INTRAVENOUS | Status: AC
  Administered 2013-10-14: 500 mL via INTRAVENOUS

## 2013-10-14 MED ORDER — IOHEXOL 300 MG/ML  SOLN
25.0000 mL | Freq: Once | INTRAMUSCULAR | Status: AC | PRN
  Administered 2013-10-14: 25 mL via ORAL

## 2013-10-14 MED ORDER — HYDROMORPHONE HCL PF 1 MG/ML IJ SOLN
1.0000 mg | INTRAMUSCULAR | Status: DC | PRN
  Administered 2013-10-14: 1 mg via INTRAVENOUS
  Filled 2013-10-14: qty 1

## 2013-10-14 NOTE — ED Notes (Signed)
Pt moved up in bed and given ice chips per MD.

## 2013-10-14 NOTE — ED Notes (Signed)
Pt called in main ED waiting area with no response

## 2013-10-14 NOTE — ED Notes (Signed)
RN spoke to pt daughter. 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

## 2013-10-14 NOTE — ED Notes (Signed)
Attempted to call report

## 2013-10-14 NOTE — ED Notes (Signed)
NOTIFIED DR .LOCKWOOD OF PATIENTS LAB RESULTS OF CG4+ LACTIC ACID @16 :00 PM  ,10/14/2013.

## 2013-10-14 NOTE — ED Notes (Signed)
GI PA at bedside. 

## 2013-10-14 NOTE — ED Notes (Addendum)
Called CT stated currently on list and will have CT as soon as CT becomes available after the current critical patients.

## 2013-10-14 NOTE — ED Provider Notes (Signed)
CSN: [id number]     Arrival date & time 10/14/13  1354 History   First MD Initiated Contact with Patient 10/14/13 1459     Chief Complaint  Patient presents with  . Rectal Bleeding     (Consider location/radiation/quality/duration/timing/severity/associated sxs/prior Treatment) The history is provided by the patient and a relative (daughter).   history of present illness: 80 year old female who presents with chief complaint of rectal bleeding. Onset of symptoms was this morning. Patient reports multiple episodes of bright red blood per mixed with dark black stool. Patient reports a significant amount of bright red blood with each bowel movement has been worsening throughout the day. She has had some associated lightheadedness and generalized abdominal discomfort. The discomfort in the abdomen has been intermittent rated moderate in severity. Patient has history of prior GI bleed that family reports was due 2 colitis. Patient has a history of lung cancer with metastasis to the pancreas and liver. She has had history of multiple prior blood clots and is currently on Coumadin.  Past Medical History  Diagnosis Date  . Cancer     lung ca, RUL  . CHF (congestive heart failure)   . Hypertension   . Hx pulmonary embolism 03/2011    on coumadin till 02/2011,stopped  due to gi bleed  . GERD (gastroesophageal reflux disease)   . GI bleed   . Colitis     history  . Chest pain 06/30/11    hx blood clot in lung  . Bell's palsy   . PVD (peripheral vascular disease)   . Lung cancer     metachronous stage I nscca RUL  . History of radiation therapy 5/8/10th,14th,16th,and 10/27/2010    RUL lung nscca  . Blood transfusion     during lumbar surgery  . History of chemotherapy 2006-207    carboplatin/VP16 last January 207  . Clotting disorder     DVT/ PE  . Lung cancer 04/01/12    biopsy-lingular nodule-inv carcinoma, minor squamous cell ca  . History of radiation therapy 11/12, 11/14, 04/29/2012     lingular -squamous cell carcinoma, nodule  . Paroxysmal atrial fibrillation   . On continuous oral anticoagulation   . Diabetes mellitus without complication     patient states "diet controlled."   Past Surgical History  Procedure Laterality Date  . Right lower lobe lung resection  2007  . Cataract surgery      b/l  lens implants  . Abdominal hysterectomy      partial age 73  . Shoulder open rotator cuff repair  04/2003    left  . Appendectomy    . Carpal tunnel release    . Bladder surgery    . Lumbar spine surgery      x3, L4-S1  . Right lung biopsy  2006  . Coronary angioplasty with stent placement  2001    renal stent  . Colonoscopies  2011    polyp removal    Family History  Problem Relation Age of Onset  . Breast cancer Mother   . Uterine cancer Sister   . Lung cancer Sister   . Breast cancer Maternal Grandmother   . Colon cancer Sister   . Uterine cancer Sister    History  Substance Use Topics  . Smoking status: Former Smoker -- 2.00 packs/day for 54 years    Types: Cigarettes    Quit date: 12/01/2003  . Smokeless tobacco: Never Used  . Alcohol Use: No   OB History  Grav Para Term Preterm Abortions TAB SAB Ect Mult Living                 Review of Systems  Constitutional: Negative for fever and chills.  HENT: Negative for congestion.   Eyes: Negative for pain.  Respiratory: Negative for shortness of breath.   Cardiovascular: Negative for chest pain.  Gastrointestinal: Positive for abdominal pain and blood in stool. Negative for nausea, vomiting, diarrhea and constipation.  Genitourinary: Negative for dysuria.  Musculoskeletal: Negative for back pain.  Skin: Negative for rash and wound.  Neurological: Positive for light-headedness. Negative for headaches.  All other systems reviewed and are negative.     Allergies  Aspirin  Home Medications   Prior to Admission medications   Medication Sig Start Date End Date Taking? Authorizing Provider   acetaminophen (TYLENOL) 650 MG CR tablet Take 650 mg by mouth every 8 (eight) hours as needed. For pain    Historical Provider, MD  alendronate (FOSAMAX) 70 MG tablet Take 70 mg by mouth every 7 (seven) days. Take with a full glass of water on an empty stomach. On Tuesdays    Historical Provider, MD  amLODipine (NORVASC) 5 MG tablet Take 5 mg by mouth every morning.     Historical Provider, MD  benazepril (LOTENSIN) 10 MG tablet Take 10 mg by mouth every morning.     Historical Provider, MD  cimetidine (TAGAMET) 200 MG tablet Take 200 mg by mouth daily.      Historical Provider, MD  diphenhydramine-acetaminophen (TYLENOL PM) 25-500 MG TABS Take 1-2 tablets by mouth at bedtime as needed (pain/sleep). For sleeplessness    Historical Provider, MD  fenofibrate (TRICOR) 145 MG tablet Take 145 mg by mouth every morning.     Historical Provider, MD  furosemide (LASIX) 40 MG tablet Take 40 mg by mouth every morning.     Historical Provider, MD  lidocaine-prilocaine (EMLA) cream Apply 1 application topically as needed. 10/12/13   Chauncey Cruel, MD  methocarbamol (ROBAXIN) 500 MG tablet Take 500 mg by mouth every 8 (eight) hours as needed. For muscle spasms    Historical Provider, MD  metoprolol tartrate (LOPRESSOR) 25 MG tablet Take 25 mg by mouth 2 (two) times daily.    Historical Provider, MD  omega-3 acid ethyl esters (LOVAZA) 1 G capsule Take 2 g by mouth 2 (two) times daily.     Historical Provider, MD  ondansetron (ZOFRAN) 8 MG tablet Take 1 tablet (8 mg total) by mouth 2 (two) times daily. Start the day after chemo for 3 days. Then take as needed for nausea or vomiting. 10/12/13   Chauncey Cruel, MD  oxyCODONE-acetaminophen (PERCOCET) 5-325 MG per tablet Take 1 tablet by mouth every 4 (four) hours as needed. For pain    Historical Provider, MD  pantoprazole (PROTONIX) 40 MG tablet Take 40 mg by mouth daily.    Historical Provider, MD  prochlorperazine (COMPAZINE) 5 MG tablet Take 1 tablet (5 mg  total) by mouth 4 (four) times daily -  before meals and at bedtime. 10/12/13   Chauncey Cruel, MD  rOPINIRole (REQUIP) 0.25 MG tablet Take 0.25 mg by mouth at bedtime.    Historical Provider, MD  sennosides-docusate sodium (SENOKOT-S) 8.6-50 MG tablet Take 2 tablets by mouth daily. For constipation    Historical Provider, MD  simvastatin (ZOCOR) 80 MG tablet Take 80 mg by mouth at bedtime.     Historical Provider, MD  tiotropium (SPIRIVA) 18 MCG inhalation capsule  Place 18 mcg into inhaler and inhale daily.     Historical Provider, MD  warfarin (COUMADIN) 3 MG tablet Take 1.5-3 mg by mouth See admin instructions. Take 1.5 mg on Sat and Sun, Monday-Friday pt takes 3 mg    Historical Provider, MD   BP 121/50  Pulse 94  Temp(Src) 98.4 F (36.9 C) (Oral)  Resp 22  Ht 5\' 5"  (1.651 m)  Wt 216 lb 7.9 oz (98.2 kg)  BMI 36.03 kg/m2  SpO2 96% Physical Exam  Nursing note and vitals reviewed. Constitutional: She is oriented to person, place, and time. She appears well-developed and well-nourished. No distress.  HENT:  Head: Normocephalic and atraumatic.  Eyes: Conjunctivae are normal.  Neck: Neck supple.  Cardiovascular: Normal rate, regular rhythm, normal heart sounds and intact distal pulses.   Pulmonary/Chest: Effort normal and breath sounds normal. She has no wheezes. She has no rales.  Abdominal: Soft. She exhibits no distension. There is generalized tenderness. There is guarding.  Genitourinary: Rectal exam shows external hemorrhoid.  Gross blood on rectal exam  Musculoskeletal: Normal range of motion.  Neurological: She is alert and oriented to person, place, and time.  Skin: Skin is warm and dry.    ED Course  Procedures (including critical care time) Labs Review Labs Reviewed  PROTIME-INR - Abnormal; Notable for the following:    Prothrombin Time 16.7 (*)    All other components within normal limits  CBC - Abnormal; Notable for the following:    WBC 10.6 (*)    All other  components within normal limits  COMPREHENSIVE METABOLIC PANEL - Abnormal; Notable for the following:    Sodium 136 (*)    Chloride 94 (*)    Glucose, Bld 118 (*)    BUN 38 (*)    Creatinine, Ser 1.69 (*)    Albumin 3.3 (*)    AST 277 (*)    GFR calc non Af Amer 27 (*)    GFR calc Af Amer 32 (*)    All other components within normal limits  HEMOGLOBIN AND HEMATOCRIT, BLOOD  HEMOGLOBIN AND HEMATOCRIT, BLOOD  HEMOGLOBIN AND HEMATOCRIT, BLOOD  I-STAT CG4 LACTIC ACID, ED  TYPE AND SCREEN    Imaging Review Ct Abdomen Pelvis Wo Contrast  10/14/2013   CLINICAL DATA:  Bleeding from the rectum.  EXAM: CT ABDOMEN AND PELVIS WITHOUT CONTRAST  TECHNIQUE: Multidetector CT imaging of the abdomen and pelvis was performed following the standard protocol without IV contrast.  COMPARISON:  US BIOPSY dated 08/28/2013; MR ABDOMEN W/O CM dated 07/24/2013; NM PET IMAGE RESTAG (PS) SKULL BASE TO THIGH dated 07/03/2013; CT ABD/PELVIS W CM dated 12/30/2012  FINDINGS: Bones: Postoperative changes from L5 through S1. No evidence of skeletal metastases. No aggressive osseous lesions identified.  Lung Bases: Atelectasis and probable scarring in the left lower lobe and lingula. Coronary artery atherosclerosis and aortic atherosclerosis. Extensive calcification of the tracheobronchial tree and old granulomatous calcifications.  Liver: Unenhanced CT was performed per clinician order. Lack of IV contrast limits sensitivity and specificity, especially for evaluation of abdominal/pelvic solid viscera.  Hepatomegaly is present which is new compared to the prior MRI of the abdomen and there is a diffuse nodular contour of the liver, due to diffuse metastatic disease. On prior MRI, the liver span was under 18 cm. On today's examination, the liver span is about 24 cm. Cirrhosis developing in this time interval since the MRI is unlikely.  Spleen:  Normal.  Gallbladder:  Contracted.  Common bile  duct:  Grossly normal.  Pancreas: Larger mass  in the head of the pancreas, with central low attenuation compatible with central necrosis. Tail the pancreas appears within normal limits. No definite ductal obstruction or evidence pancreatitis.  Adrenal glands: Stable nodule in the left adrenal gland. Right adrenal normal.  Kidneys: Right inferior pole exophytic lesions, most compatible with cysts. Vascular calcifications in the renal hila bilaterally. The ureters appear within normal limits.  Stomach: Mild distention of the stomach with oral contrast. No inflammatory changes.  Small bowel: Duodenum and small bowel grossly appears within normal limits. Pancreatic head mass extends up to the second part of the duodenum.  Colon: No inflammatory changes of colon. The distal colon is decompressed. No colonic mass lesion is identified.  Pelvic Genitourinary: Urinary bladder appears normal. Hysterectomy. No free fluid. Ovaries appear within normal limits.  Vasculature: Atherosclerosis without gross acute vascular abnormality. 27 mm juxta renal abdominal aortic aneurysm. Distal infrarenal abdominal aortic stenosis with dense calcification. Portal vein grossly appears normal for noncontrast CT.  Body Wall: Normal.  IMPRESSION: 1. New hepatomegaly and diffuse heterogeneous attenuation, most compatible with diffuse hepatic metastatic disease from pancreatic cancer. 2. Interval enlargement of pancreatic head mass, now with central low attenuation suggesting central necrosis. 3. Juxtarenal abdominal aortic aneurysm measuring 27 mm. Ectatic abdominal aorta at risk for aneurysm development. Recommend follow up by [place] in 5 years. This recommendation follows ACR consensus guidelines: White Paper of the ACR Incidental Findings Committee II on Vascular Findings. J Am Coll Radiol 2013; 10:789-794. Inferior stretched infrarenal abdominal aortic stenosis. 4. No colonic mass is identified in this patient with rectal bleeding. The distal colon is decompressed, probably secondary to the  cathartic affects of GI bleed.   Electronically Signed   By: Dereck Ligas M.D.   On: 10/14/2013 20:07     EKG Interpretation None      MDM   Final diagnoses:  GI bleed    80 year old female with history of lung cancer with metastasis to the pancreas and liver, hypertension, pulmonary embolisms on Coumadin, prior GI bleed who presents with multiple episodes of bright red blood per rectum and melena today. Patient hypotensive to the 90s/50s with vital signs otherwise stable presentation. Exam with generalized abdominal tenderness to palpation. Gross blood on rectal exam.  Giving IVF. Getting labs including type and screen, coags on the lactic acid.  Discussion was had regarding CODE STATUS with the patient and her daughter who is her healthcare power of attorney. Patient does not want CPR including chest compressions, no intubation, no defibrillations.  Patient is agreeable to blood transfusion if necessary.  Hemoglobin stable at 12.3. INR 1.3. CT scan showed hepatomegaly and interval enlargement of pancreatic head mass. No findings identified to explain patient's GI bleed.  Patient's blood pressure improved with IV fluids.  Patient's gastroenterologist is Dr. Benson Norway. Spoke with Dr.Hung's colleague Dr. Collene Mares who stated they will follow the patient while inpatient.  Hospitalist consulted and will admit for further management.  Renaldo Reel, MD 10/15/13 [phone]

## 2013-10-14 NOTE — ED Notes (Signed)
Doctor at bedside.

## 2013-10-14 NOTE — ED Notes (Signed)
Admitting MD here to see pt.  States she will order medication for pt's back pain.

## 2013-10-14 NOTE — H&P (Signed)
PCP:   [name], NP   Chief Complaint:  Rectal bleeding  HPI: 80 yo female h/o lung cancer with mets to liver/pancrease on chemo comes in with several episodes today of large brbpr.  No abd pain.  No rectal pain.  No n/v.  No fevers.  She has had several more episodes while in the ED waiting.  Again denies pain.    Review of Systems:  Positive and negative as per HPI otherwise all other systems are negative  Past Medical History: Past Medical History  Diagnosis Date  . Cancer     lung ca, RUL  . CHF (congestive heart failure)   . Hypertension   . Hx pulmonary embolism 03/2011    on coumadin till 02/2011,stopped  due to gi bleed  . GERD (gastroesophageal reflux disease)   . GI bleed   . Colitis     history  . Chest pain 06/30/11    hx blood clot in lung  . Bell's palsy   . PVD (peripheral vascular disease)   . Lung cancer     metachronous stage I nscca RUL  . History of radiation therapy 5/8/10th,14th,16th,and 10/27/2010    RUL lung nscca  . Blood transfusion     during lumbar surgery  . History of chemotherapy 2006-207    carboplatin/VP16 last January 207  . Clotting disorder     DVT/ PE  . Lung cancer 04/01/12    biopsy-lingular nodule-inv carcinoma, minor squamous cell ca  . History of radiation therapy 11/12, 11/14, 04/29/2012    lingular -squamous cell carcinoma, nodule  . Paroxysmal atrial fibrillation   . On continuous oral anticoagulation   . Diabetes mellitus without complication     patient states "diet controlled."   Past Surgical History  Procedure Laterality Date  . Right lower lobe lung resection  2007  . Cataract surgery      b/l  lens implants  . Abdominal hysterectomy      partial age 13  . Shoulder open rotator cuff repair  04/2003    left  . Appendectomy    . Carpal tunnel release    . Bladder surgery    . Lumbar spine surgery      x3, L4-S1  . Right lung biopsy  2006  . Coronary angioplasty with stent placement  2001    renal stent  .  Colonoscopies  2011    polyp removal     Medications: Prior to Admission medications   Medication Sig Start Date End Date Taking? Authorizing Provider  acetaminophen (TYLENOL) 650 MG CR tablet Take 650 mg by mouth every 8 (eight) hours as needed. For pain   Yes Historical Provider, MD  alendronate (FOSAMAX) 70 MG tablet Take 70 mg by mouth every 7 (seven) days. Take with a full glass of water on an empty stomach. On Tuesdays   Yes Historical Provider, MD  amLODipine (NORVASC) 5 MG tablet Take 5 mg by mouth every morning.    Yes Historical Provider, MD  benazepril (LOTENSIN) 10 MG tablet Take 10 mg by mouth every morning.    Yes Historical Provider, MD  CARBOplatin (PARAPLATIN IV) Inject 280 mcg into the vein daily.    Yes Historical Provider, MD  cimetidine (TAGAMET) 200 MG tablet Take 200 mg by mouth daily.     Yes Historical Provider, MD  Cyanocobalamin (B-12 PO) Take 1 tablet by mouth daily.   Yes Historical Provider, MD  diphenhydramine-acetaminophen (TYLENOL PM) 25-500 MG TABS Take 1-2 tablets  by mouth at bedtime as needed (pain/sleep). For sleeplessness   Yes Historical Provider, MD  Ergocalciferol (VITAMIN D2) 400 UNITS TABS Take 40 Units by mouth daily.   Yes Historical Provider, MD  Etoposide (VEPESID IV) Inject 170 mg into the vein daily.   Yes Historical Provider, MD  fenofibrate (TRICOR) 145 MG tablet Take 145 mg by mouth every morning.    Yes Historical Provider, MD  furosemide (LASIX) 40 MG tablet Take 40 mg by mouth every morning.    Yes Historical Provider, MD  Garlic 2 MG CAPS Take 1 capsule by mouth daily.   Yes Historical Provider, MD  Glucosamine-Chondroitin (GLUCOSAMINE CHONDR COMPLEX PO) Take 1 tablet by mouth daily.   Yes Historical Provider, MD  lidocaine-prilocaine (EMLA) cream Apply 1 application topically as needed. 10/12/13  Yes Chauncey Cruel, MD  lidocaine-prilocaine (EMLA) cream Apply 1 application topically as needed (for chemo).   Yes Historical Provider, MD   methocarbamol (ROBAXIN) 500 MG tablet Take 500 mg by mouth every 8 (eight) hours as needed. For muscle spasms   Yes Historical Provider, MD  metoprolol tartrate (LOPRESSOR) 25 MG tablet Take 25 mg by mouth 2 (two) times daily.   Yes Historical Provider, MD  Omega-3 Fatty Acids (OMEGA-3 FISH OIL PO) Take 1 capsule by mouth daily.   Yes Historical Provider, MD  ondansetron (ZOFRAN) 8 MG tablet Take 1 tablet (8 mg total) by mouth 2 (two) times daily. Start the day after chemo for 3 days. Then take as needed for nausea or vomiting. 10/12/13  Yes Chauncey Cruel, MD  oxyCODONE-acetaminophen (PERCOCET) 5-325 MG per tablet Take 1 tablet by mouth every 4 (four) hours as needed. For pain   Yes Historical Provider, MD  pantoprazole (PROTONIX) 40 MG tablet Take 40 mg by mouth daily.   Yes Historical Provider, MD  prochlorperazine (COMPAZINE) 5 MG tablet Take 1 tablet (5 mg total) by mouth 4 (four) times daily -  before meals and at bedtime. 10/12/13  Yes Chauncey Cruel, MD  rOPINIRole (REQUIP) 0.25 MG tablet Take 0.25 mg by mouth at bedtime.   Yes Historical Provider, MD  sennosides-docusate sodium (SENOKOT-S) 8.6-50 MG tablet Take 2 tablets by mouth daily. For constipation   Yes Historical Provider, MD  simvastatin (ZOCOR) 80 MG tablet Take 80 mg by mouth at bedtime.    Yes Historical Provider, MD  tiotropium (SPIRIVA) 18 MCG inhalation capsule Place 18 mcg into inhaler and inhale daily.    Yes Historical Provider, MD  vitamin C (ASCORBIC ACID) 500 MG tablet Take 500 mg by mouth daily.   Yes Historical Provider, MD  warfarin (COUMADIN) 3 MG tablet Take 1.5-3 mg by mouth daily at 6 PM. Take 1.5 mg on Sat and Sun Takes 3mg  all other days   Yes Historical Provider, MD    Allergies:   Allergies  Allergen Reactions  . Aspirin Other (See Comments)    Stomach pain    Social History:  reports that she quit smoking about 9 years ago. Her smoking use included Cigarettes. She has a 108 pack-year smoking  history. She has never used smokeless tobacco. She reports that she does not drink alcohol or use illicit drugs.  Family History: Family History  Problem Relation Age of Onset  . Breast cancer Mother   . Uterine cancer Sister   . Lung cancer Sister   . Breast cancer Maternal Grandmother   . Colon cancer Sister   . Uterine cancer Sister  Physical Exam: Filed Vitals:   10/14/13 1815 10/14/13 1850 10/14/13 2000 10/14/13 2030  BP: 110/53 100/45 101/31 100/50  Pulse: 96 100 97 101  Temp:      TempSrc:      Resp: 19 23 20    SpO2: 97% 98% 96% 98%   General appearance: alert, cooperative and no distress Head: Normocephalic, without obvious abnormality, atraumatic Eyes: negative Nose: Nares normal. Septum midline. Mucosa normal. No drainage or sinus tenderness. Neck: no JVD and supple, symmetrical, trachea midline Lungs: clear to auscultation bilaterally Heart: regular rate and rhythm, S1, S2 normal, no murmur, click, rub or gallop Abdomen: soft, non-tender; bowel sounds normal; no masses,  no organomegaly Extremities: extremities normal, atraumatic, no cyanosis or edema Pulses: 2+ and symmetric Skin: Skin color, texture, turgor normal. No rashes or lesions Neurologic: Grossly normal  Labs on Admission:   Recent Labs  10/12/13 0812 10/14/13 1519  NA 131* 136*  K 4.0 4.8  CL  --  94*  CO2 25 24  GLUCOSE 117 118*  BUN 25.7 38*  CREATININE 1.5* 1.69*  CALCIUM 10.0 9.9    Recent Labs  10/12/13 0812 10/14/13 1519  AST 249* 277*  ALT 12 22  ALKPHOS 46 54  BILITOT 0.59 0.4  PROT 6.7 7.1  ALBUMIN 3.1* 3.3*    Recent Labs  10/12/13 0812 10/14/13 1519  WBC 8.4 10.6*  NEUTROABS 5.9  --   HGB 12.4 12.3  HCT 37.4 37.4  MCV 91.5 91.9  PLT 179 212    Radiological Exams on Admission: Ct Abdomen Pelvis Wo Contrast  10/14/2013   CLINICAL DATA:  Bleeding from the rectum.  EXAM: CT ABDOMEN AND PELVIS WITHOUT CONTRAST  TECHNIQUE: Multidetector CT imaging of the  abdomen and pelvis was performed following the standard protocol without IV contrast.  COMPARISON:  US BIOPSY dated 08/28/2013; MR ABDOMEN W/O CM dated 07/24/2013; NM PET IMAGE RESTAG (PS) SKULL BASE TO THIGH dated 07/03/2013; CT ABD/PELVIS W CM dated 12/30/2012  FINDINGS: Bones: Postoperative changes from L5 through S1. No evidence of skeletal metastases. No aggressive osseous lesions identified.  Lung Bases: Atelectasis and probable scarring in the left lower lobe and lingula. Coronary artery atherosclerosis and aortic atherosclerosis. Extensive calcification of the tracheobronchial tree and old granulomatous calcifications.  Liver: Unenhanced CT was performed per clinician order. Lack of IV contrast limits sensitivity and specificity, especially for evaluation of abdominal/pelvic solid viscera.  Hepatomegaly is present which is new compared to the prior MRI of the abdomen and there is a diffuse nodular contour of the liver, due to diffuse metastatic disease. On prior MRI, the liver span was under 18 cm. On today's examination, the liver span is about 24 cm. Cirrhosis developing in this time interval since the MRI is unlikely.  Spleen:  Normal.  Gallbladder:  Contracted.  Common bile duct:  Grossly normal.  Pancreas: Larger mass in the head of the pancreas, with central low attenuation compatible with central necrosis. Tail the pancreas appears within normal limits. No definite ductal obstruction or evidence pancreatitis.  Adrenal glands: Stable nodule in the left adrenal gland. Right adrenal normal.  Kidneys: Right inferior pole exophytic lesions, most compatible with cysts. Vascular calcifications in the renal hila bilaterally. The ureters appear within normal limits.  Stomach: Mild distention of the stomach with oral contrast. No inflammatory changes.  Small bowel: Duodenum and small bowel grossly appears within normal limits. Pancreatic head mass extends up to the second part of the duodenum.  Colon: No  inflammatory  changes of colon. The distal colon is decompressed. No colonic mass lesion is identified.  Pelvic Genitourinary: Urinary bladder appears normal. Hysterectomy. No free fluid. Ovaries appear within normal limits.  Vasculature: Atherosclerosis without gross acute vascular abnormality. 27 mm juxta renal abdominal aortic aneurysm. Distal infrarenal abdominal aortic stenosis with dense calcification. Portal vein grossly appears normal for noncontrast CT.  Body Wall: Normal.  IMPRESSION: 1. New hepatomegaly and diffuse heterogeneous attenuation, most compatible with diffuse hepatic metastatic disease from pancreatic cancer. 2. Interval enlargement of pancreatic head mass, now with central low attenuation suggesting central necrosis. 3. Juxtarenal abdominal aortic aneurysm measuring 27 mm. Ectatic abdominal aorta at risk for aneurysm development. Recommend follow up by [place] in 5 years. This recommendation follows ACR consensus guidelines: White Paper of the ACR Incidental Findings Committee II on Vascular Findings. J Am Coll Radiol 2013; 10:789-794. Inferior stretched infrarenal abdominal aortic stenosis. 4. No colonic mass is identified in this patient with rectal bleeding. The distal colon is decompressed, probably secondary to the cathartic affects of GI bleed.   Electronically Signed   By: Dereck Ligas M.D.   On: 10/14/2013 20:07   Assessment/Plan  80 yo female with acute lgib  Principal Problem:   GI bleed-  hgb over 12 now.  Serial h/h q 8hours.  Vss.  Gi called and will see in am.  Transfuse prn.  Place in stepdown overnight due to soft vitals, and active gib.   Active Problems:   Hx pulmonary embolism-  Hold all anticoagulants and antiplatelet tx.   CHF (congestive heart failure)   Clotting disorder   Paroxysmal atrial fibrillation   Hypotension   Metastatic lung cancer (metastasis from lung to other site)  Pt is DNR confirmed and discussed with patient.  No cpr or intubation in  future.  Chere Babson A Shanon Brow 10/14/2013, 8:47 PM

## 2013-10-14 NOTE — ED Notes (Signed)
Per pt sts dark and bright red bleeding from rectum that started this am. sts every time she stands up it comes out. sts intermittent abdominal pain. Pt on chemo for CA.

## 2013-10-15 ENCOUNTER — Encounter (HOSPITAL_COMMUNITY): Payer: Self-pay | Admitting: *Deleted

## 2013-10-15 ENCOUNTER — Ambulatory Visit: Payer: Medicare Other

## 2013-10-15 ENCOUNTER — Telehealth: Payer: Self-pay | Admitting: *Deleted

## 2013-10-15 DIAGNOSIS — I739 Peripheral vascular disease, unspecified: Secondary | ICD-10-CM

## 2013-10-15 DIAGNOSIS — D689 Coagulation defect, unspecified: Secondary | ICD-10-CM

## 2013-10-15 DIAGNOSIS — I10 Essential (primary) hypertension: Secondary | ICD-10-CM

## 2013-10-15 DIAGNOSIS — I4891 Unspecified atrial fibrillation: Secondary | ICD-10-CM

## 2013-10-15 DIAGNOSIS — R0602 Shortness of breath: Secondary | ICD-10-CM

## 2013-10-15 DIAGNOSIS — Z923 Personal history of irradiation: Secondary | ICD-10-CM

## 2013-10-15 LAB — HEMOGLOBIN AND HEMATOCRIT, BLOOD
HCT: 30.3 % — ABNORMAL LOW (ref 36.0–46.0)
HCT: 31.5 % — ABNORMAL LOW (ref 36.0–46.0)
HCT: 35.2 % — ABNORMAL LOW (ref 36.0–46.0)
HEMOGLOBIN: 10.2 g/dL — AB (ref 12.0–15.0)
Hemoglobin: 9.7 g/dL — ABNORMAL LOW (ref 12.0–15.0)
Hemoglobin: 11.3 g/dL — ABNORMAL LOW (ref 12.0–15.0)

## 2013-10-15 LAB — MRSA PCR SCREENING: MRSA by PCR: NEGATIVE

## 2013-10-15 LAB — PREPARE RBC (CROSSMATCH)

## 2013-10-15 MED ORDER — OXYCODONE HCL 5 MG PO TABS
5.0000 mg | ORAL_TABLET | ORAL | Status: DC | PRN
  Administered 2013-10-15 – 2013-10-16 (×3): 5 mg via ORAL
  Filled 2013-10-15 (×3): qty 1

## 2013-10-15 MED ORDER — TIOTROPIUM BROMIDE MONOHYDRATE 18 MCG IN CAPS
18.0000 ug | ORAL_CAPSULE | Freq: Every day | RESPIRATORY_TRACT | Status: DC
  Administered 2013-10-16 – 2013-10-17 (×2): 18 ug via RESPIRATORY_TRACT
  Filled 2013-10-15: qty 5

## 2013-10-15 MED ORDER — PANTOPRAZOLE SODIUM 40 MG PO TBEC
40.0000 mg | DELAYED_RELEASE_TABLET | Freq: Every day | ORAL | Status: DC
  Administered 2013-10-15: 40 mg via ORAL
  Filled 2013-10-15: qty 1

## 2013-10-15 MED ORDER — SODIUM CHLORIDE 0.9 % IV SOLN
INTRAVENOUS | Status: DC
  Administered 2013-10-15: 20:00:00 via INTRAVENOUS

## 2013-10-15 MED ORDER — BIOTENE DRY MOUTH MT LIQD
15.0000 mL | Freq: Two times a day (BID) | OROMUCOSAL | Status: DC
  Administered 2013-10-15 – 2013-10-17 (×5): 15 mL via OROMUCOSAL

## 2013-10-15 MED ORDER — ACETAMINOPHEN 325 MG PO TABS
650.0000 mg | ORAL_TABLET | Freq: Four times a day (QID) | ORAL | Status: DC | PRN
  Administered 2013-10-15 – 2013-10-16 (×2): 650 mg via ORAL
  Filled 2013-10-15 (×2): qty 2

## 2013-10-15 MED ORDER — SODIUM CHLORIDE 0.9 % IV BOLUS (SEPSIS)
1000.0000 mL | Freq: Once | INTRAVENOUS | Status: AC
  Administered 2013-10-15: 1000 mL via INTRAVENOUS

## 2013-10-15 MED ORDER — PANTOPRAZOLE SODIUM 40 MG IV SOLR
40.0000 mg | Freq: Two times a day (BID) | INTRAVENOUS | Status: DC
  Administered 2013-10-15 – 2013-10-16 (×2): 40 mg via INTRAVENOUS
  Filled 2013-10-15 (×3): qty 40

## 2013-10-15 MED ORDER — ONDANSETRON HCL 4 MG PO TABS
4.0000 mg | ORAL_TABLET | Freq: Four times a day (QID) | ORAL | Status: DC | PRN
  Administered 2013-10-16: 4 mg via ORAL
  Filled 2013-10-15: qty 1

## 2013-10-15 MED ORDER — PEG 3350-KCL-NA BICARB-NACL 420 G PO SOLR
4000.0000 mL | Freq: Once | ORAL | Status: AC
  Administered 2013-10-15: 4000 mL via ORAL
  Filled 2013-10-15: qty 4000

## 2013-10-15 MED ORDER — SODIUM CHLORIDE 0.9 % IV SOLN
INTRAVENOUS | Status: AC
  Administered 2013-10-15: 02:00:00 via INTRAVENOUS

## 2013-10-15 MED ORDER — ROPINIROLE HCL 0.25 MG PO TABS
0.2500 mg | ORAL_TABLET | Freq: Every day | ORAL | Status: DC
  Administered 2013-10-15 – 2013-10-16 (×3): 0.25 mg via ORAL
  Filled 2013-10-15 (×4): qty 1

## 2013-10-15 MED ORDER — ONDANSETRON HCL 4 MG/2ML IJ SOLN
4.0000 mg | Freq: Four times a day (QID) | INTRAMUSCULAR | Status: DC | PRN
Start: 2013-10-15 — End: 2013-10-17
  Administered 2013-10-17: 4 mg via INTRAVENOUS
  Filled 2013-10-15: qty 2

## 2013-10-15 NOTE — Consult Note (Signed)
Reason for Consult: Hematochezia and melena Referring Physician: [facility]   [name] HPI: This is an 80 year old female with a PMH of colitis in 2012, metastatic small cell lung cancer, HTN, paroxysmal afib, and PE who is admitted for complaints of hematochezia.  Her bleeding started yesterday.  In the past she had issues with hematochezia and it was diagnosed as an active colitis in 2012.  Asacol was used to treat her and her symptoms ultimately resolved.  Over the years she had some complaints of hematochezia that was attributed to her hemorrhoids, however, at this time her bleeding is more profuse.  No reports of diverticula with the prior colonoscopy.   Past Medical History  Diagnosis Date  . Cancer     lung ca, RUL  . CHF (congestive heart failure)   . Hypertension   . Hx pulmonary embolism 03/2011    on coumadin till 02/2011,stopped  due to gi bleed  . GERD (gastroesophageal reflux disease)   . GI bleed   . Colitis     history  . Chest pain 06/30/11    hx blood clot in lung  . Bell's palsy   . PVD (peripheral vascular disease)   . Lung cancer     metachronous stage I nscca RUL  . History of radiation therapy 5/8/10th,14th,16th,and 10/27/2010    RUL lung nscca  . Blood transfusion     during lumbar surgery  . History of chemotherapy 2006-207    carboplatin/VP16 last January 207  . Clotting disorder     DVT/ PE  . Lung cancer 04/01/12    biopsy-lingular nodule-inv carcinoma, minor squamous cell ca  . History of radiation therapy 11/12, 11/14, 04/29/2012    lingular -squamous cell carcinoma, nodule  . Paroxysmal atrial fibrillation   . On continuous oral anticoagulation   . Diabetes mellitus without complication     patient states "diet controlled."    Past Surgical History  Procedure Laterality Date  . Right lower lobe lung resection  2007  . Cataract surgery      b/l  lens implants  . Abdominal hysterectomy      partial age 20  . Shoulder open  rotator cuff repair  04/2003    left  . Appendectomy    . Carpal tunnel release    . Bladder surgery    . Lumbar spine surgery      x3, L4-S1  . Right lung biopsy  2006  . Coronary angioplasty with stent placement  2001    renal stent  . Colonoscopies  2011    polyp removal     Family History  Problem Relation Age of Onset  . Breast cancer Mother   . Uterine cancer Sister   . Lung cancer Sister   . Breast cancer Maternal Grandmother   . Colon cancer Sister   . Uterine cancer Sister     Social History:  reports that she quit smoking about 9 years ago. Her smoking use included Cigarettes. She has a 108 pack-year smoking history. She has never used smokeless tobacco. She reports that she does not drink alcohol or use illicit drugs.  Allergies:  Allergies  Allergen Reactions  . Aspirin Other (See Comments)    Stomach pain    Medications:  Scheduled: . antiseptic oral rinse  15 mL Mouth Rinse BID  . pantoprazole  40 mg Oral Daily  . rOPINIRole  0.25 mg Oral QHS   Continuous:   Results for orders  placed during the hospital encounter of 10/14/13 (from the past 24 hour(s))  PROTIME-INR     Status: Abnormal   Collection Time    10/14/13  3:19 PM      Result Value Ref Range   Prothrombin Time 16.7 (*) 11.6 - 15.2 seconds   INR 1.39  0.00 - 1.49  CBC     Status: Abnormal   Collection Time    10/14/13  3:19 PM      Result Value Ref Range   WBC 10.6 (*) 4.0 - 10.5 K/uL   RBC 4.07  3.87 - 5.11 MIL/uL   Hemoglobin 12.3  12.0 - 15.0 g/dL   HCT 37.4  36.0 - 46.0 %   MCV 91.9  78.0 - 100.0 fL   MCH 30.2  26.0 - 34.0 pg   MCHC 32.9  30.0 - 36.0 g/dL   RDW 15.0  11.5 - 15.5 %   Platelets 212  150 - 400 K/uL  COMPREHENSIVE METABOLIC PANEL     Status: Abnormal   Collection Time    10/14/13  3:19 PM      Result Value Ref Range   Sodium 136 (*) 137 - 147 mEq/L   Potassium 4.8  3.7 - 5.3 mEq/L   Chloride 94 (*) 96 - 112 mEq/L   CO2 24  19 - 32 mEq/L   Glucose, Bld 118 (*)  70 - 99 mg/dL   BUN 38 (*) 6 - 23 mg/dL   Creatinine, Ser 1.69 (*) 0.50 - 1.10 mg/dL   Calcium 9.9  8.4 - 10.5 mg/dL   Total Protein 7.1  6.0 - 8.3 g/dL   Albumin 3.3 (*) 3.5 - 5.2 g/dL   AST 277 (*) 0 - 37 U/L   ALT 22  0 - 35 U/L   Alkaline Phosphatase 54  39 - 117 U/L   Total Bilirubin 0.4  0.3 - 1.2 mg/dL   GFR calc non Af Amer 27 (*) >90 mL/min   GFR calc Af Amer 32 (*) >90 mL/min  TYPE AND SCREEN     Status: None   Collection Time    10/14/13  3:25 PM      Result Value Ref Range   ABO/RH(D) O POS     Antibody Screen NEG     Sample Expiration 10/17/2013     Unit Number [id number]     Blood Component Type RBC LR PHER1     Unit division 00     Status of Unit ISSUED     Transfusion Status OK TO TRANSFUSE     Crossmatch Result Compatible     Unit Number [id number]     Blood Component Type RBC LR PHER2     Unit division 00     Status of Unit ALLOCATED     Transfusion Status OK TO TRANSFUSE     Crossmatch Result Compatible    I-STAT CG4 LACTIC ACID, ED     Status: None   Collection Time    10/14/13  3:39 PM      Result Value Ref Range   Lactic Acid, Venous 1.70  0.5 - 2.2 mmol/L  HEMOGLOBIN AND HEMATOCRIT, BLOOD     Status: Abnormal   Collection Time    10/15/13  3:19 AM      Result Value Ref Range   Hemoglobin 9.7 (*) 12.0 - 15.0 g/dL   HCT 30.3 (*) 36.0 - 46.0 %  MRSA PCR SCREENING     Status: None  Collection Time    10/15/13  4:07 AM      Result Value Ref Range   MRSA by PCR NEGATIVE  NEGATIVE  HEMOGLOBIN AND HEMATOCRIT, BLOOD     Status: Abnormal   Collection Time    10/15/13 10:05 AM      Result Value Ref Range   Hemoglobin 10.2 (*) 12.0 - 15.0 g/dL   HCT 31.5 (*) 36.0 - 46.0 %  PREPARE RBC (CROSSMATCH)     Status: None   Collection Time    10/15/13 10:27 AM      Result Value Ref Range   Order Confirmation ORDER PROCESSED BY BLOOD BANK       Ct Abdomen Pelvis Wo Contrast  10/14/2013   CLINICAL DATA:  Bleeding from the rectum.  EXAM: CT ABDOMEN  AND PELVIS WITHOUT CONTRAST  TECHNIQUE: Multidetector CT imaging of the abdomen and pelvis was performed following the standard protocol without IV contrast.  COMPARISON:  US BIOPSY dated 08/28/2013; MR ABDOMEN W/O CM dated 07/24/2013; NM PET IMAGE RESTAG (PS) SKULL BASE TO THIGH dated 07/03/2013; CT ABD/PELVIS W CM dated 12/30/2012  FINDINGS: Bones: Postoperative changes from L5 through S1. No evidence of skeletal metastases. No aggressive osseous lesions identified.  Lung Bases: Atelectasis and probable scarring in the left lower lobe and lingula. Coronary artery atherosclerosis and aortic atherosclerosis. Extensive calcification of the tracheobronchial tree and old granulomatous calcifications.  Liver: Unenhanced CT was performed per clinician order. Lack of IV contrast limits sensitivity and specificity, especially for evaluation of abdominal/pelvic solid viscera.  Hepatomegaly is present which is new compared to the prior MRI of the abdomen and there is a diffuse nodular contour of the liver, due to diffuse metastatic disease. On prior MRI, the liver span was under 18 cm. On today's examination, the liver span is about 24 cm. Cirrhosis developing in this time interval since the MRI is unlikely.  Spleen:  Normal.  Gallbladder:  Contracted.  Common bile duct:  Grossly normal.  Pancreas: Larger mass in the head of the pancreas, with central low attenuation compatible with central necrosis. Tail the pancreas appears within normal limits. No definite ductal obstruction or evidence pancreatitis.  Adrenal glands: Stable nodule in the left adrenal gland. Right adrenal normal.  Kidneys: Right inferior pole exophytic lesions, most compatible with cysts. Vascular calcifications in the renal hila bilaterally. The ureters appear within normal limits.  Stomach: Mild distention of the stomach with oral contrast. No inflammatory changes.  Small bowel: Duodenum and small bowel grossly appears within normal limits. Pancreatic head  mass extends up to the second part of the duodenum.  Colon: No inflammatory changes of colon. The distal colon is decompressed. No colonic mass lesion is identified.  Pelvic Genitourinary: Urinary bladder appears normal. Hysterectomy. No free fluid. Ovaries appear within normal limits.  Vasculature: Atherosclerosis without gross acute vascular abnormality. 27 mm juxta renal abdominal aortic aneurysm. Distal infrarenal abdominal aortic stenosis with dense calcification. Portal vein grossly appears normal for noncontrast CT.  Body Wall: Normal.  IMPRESSION: 1. New hepatomegaly and diffuse heterogeneous attenuation, most compatible with diffuse hepatic metastatic disease from pancreatic cancer. 2. Interval enlargement of pancreatic head mass, now with central low attenuation suggesting central necrosis. 3. Juxtarenal abdominal aortic aneurysm measuring 27 mm. Ectatic abdominal aorta at risk for aneurysm development. Recommend follow up by [place] in 5 years. This recommendation follows ACR consensus guidelines: White Paper of the ACR Incidental Findings Committee II on Vascular Findings. J Am Coll Radiol 2013; 10:789-794. Inferior  stretched infrarenal abdominal aortic stenosis. 4. No colonic mass is identified in this patient with rectal bleeding. The distal colon is decompressed, probably secondary to the cathartic affects of GI bleed.   Electronically Signed   By: Dereck Ligas M.D.   On: 10/14/2013 20:07    ROS:  As stated above in the HPI otherwise negative.  Blood pressure 106/49, pulse 94, temperature 98.3 F (36.8 C), temperature source Oral, resp. rate 22, height 5\' 5"  (1.651 m), weight 216 lb 7.9 oz (98.2 kg), SpO2 98.00%.    PE: Gen: NAD, Alert and Oriented HEENT:  [place]/AT, EOMI Neck: Supple, no LAD Lungs: CTA Bilaterally CV: RRR without M/G/R ABM: Soft, NTND, +BS Ext: No C/C/E  Assessment/Plan: 1) Hematochezia. 2) Metastatic lung cancer.   The patient desires to know the source of the  bleeding.  Despite her metastatic disease, she appears to be in reasonable health to undergo a colonoscopy.  Plan: 1) Colonoscopy tomorrow.  If a source is not identified, I will perform an EGD.  Beryle Beams 10/15/2013, 1:53 PM

## 2013-10-15 NOTE — Telephone Encounter (Signed)
No chemo follow up call due to pt is in the hospital since 10/14/13.

## 2013-10-15 NOTE — ED Provider Notes (Signed)
This patient was seen in conjunction with the resident physician. The documentation accurately reflects the patient's encounter in the emergency department, with the following additions:  On my exam the patient was in no distress.  Patient has a notable malignancy, and evaluation here demonstrated likely progression of disease. Patient was resuscitated with IV fluids, admitted for further evaluation and management.  Carmin Muskrat, MD 10/15/13 [phone]

## 2013-10-15 NOTE — Progress Notes (Signed)
Utilization review completed.  

## 2013-10-15 NOTE — Progress Notes (Signed)
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 comes in with several episodes today of large brbpr. No abd pain. No rectal pain. No n/v. No fevers. She has had several more episodes while in the ED waiting. Again denies pain.    HPI/Subjective: 5/7 states attending chemotherapy on Monday/Tuesday, however on Wednesday was too weak to attend her scheduled chemotherapy. States yesterday began to have significant amounts of bright red blood per rectum. Currently feels fatigued but improved from time of admission. Negative CP, negative SOB. States Dr. Carol Ada (GI) just left and has agreed to perform colonoscopy in the a.m.  Assessment/Plan: GI bleed -Consulted Dr. Carol Ada (GI) , who has agreed to perform colonoscopy and possible EGD in the a.m. -Transfuse one unit PRBC, didn't check H./H. if not> 8, transfuse second unit PRBC -Protonic 40 mg IV BID  Paroxysmal A. Fib -Currently in normal sinus rhythm -Hold metoprolol 25 mg BID  Diastolic CHF -Lasix 40 mg daily will hold  HTN -Hold Norvasc 5 mg -Hold enalapril 10 mg  HLD -Zocor 80 mg daily  CKD -Patient at baseline creatinine. Baseline 1.5-2.2  COPD -Spiriva daily  Clotting disorder/DVT/PE -Patient on chronic anticoagulation we'll hold secondary to GI bleed -Hold all blood thinning agents. Secondary to GI bleed  Lung cancer with metastases to liver/pancreas -Carboplatin 280 mcg daily; will hold -Etoposide 170 mg daily; will hold     Code Status: FULL Family Communication: None  Disposition Plan: Per GI    Consultants: Dr. Carol Ada (GI)    Procedure/Significant Events: CT abdomen pelvis without contrast 10/14/2013 - New hepatomegaly and diffuse heterogeneous attenuation, most compatible diffuse hepatic metastatic disease from pancreaticcancer.  -Interval enlargement of pancreatic head mass, now with central low attenuation suggesting central necrosis.    Culture 10/15/2013 MRSA by PCR negative  Antibiotics: NA  DVT prophylaxis: SCD   Devices NA   LINES / TUBES:  5/6 20 GA right antecubital 5/6 20 2GA left hand    Continuous Infusions: . sodium chloride 100 mL/hr at 10/15/13 0141    Objective: VITAL SIGNS: Temp: 98.2 F (36.8 C) (05/07 0800) Temp src: Oral (05/07 0800) BP: 92/52 mmHg (05/07 0800) Pulse Rate: 86 (05/07 0800) SPO2; 98% on 2 L [place] FIO2:   Intake/Output Summary (Last 24 hours) at 10/15/13 1026 Last data filed at 10/15/13 0400  Gross per 24 hour  Intake      0 ml  Output   1530 ml  Net  -1530 ml     Exam: General: A./O. x4, NAD  Lungs: Clear to auscultation bilaterally without wheezes or crackles Cardiovascular: Regular rate and rhythm without murmur gallop or rub normal S1 and S2 Abdomen: Nontender, nondistended, soft, bowel sounds positive, no rebound, no ascites, no appreciable mass Extremities: No significant cyanosis, clubbing, or edema bilateral lower extremities  Data Reviewed: Basic Metabolic Panel:  Recent Labs Lab 10/12/13 0812 10/14/13 1519  NA 131* 136*  K 4.0 4.8  CL  --  94*  CO2 25 24  GLUCOSE 117 118*  BUN 25.7 38*  CREATININE 1.5* 1.69*  CALCIUM 10.0 9.9   Liver Function Tests:  Recent Labs Lab 10/12/13 0812 10/14/13 1519  AST 249* 277*  ALT 12 22  ALKPHOS 46 54  BILITOT 0.59 0.4  PROT 6.7 7.1  ALBUMIN 3.1* 3.3*   No results found for this basename: LIPASE, AMYLASE,  in the last 168 hours No results found for this basename: AMMONIA,  in the last 168 hours CBC:  Recent Labs Lab 10/09/13 0825 10/12/13 0812 10/14/13 1519 10/15/13 0319   WBC 9.4 8.4 10.6*  --   NEUTROABS 6.2 5.9  --   --   HGB 12.5 12.4 12.3 9.7*  HCT 36.9 37.4 37.4 30.3*  MCV 88.9 91.5 91.9  --   PLT 218 179 212  --    Cardiac Enzymes: No results found for this basename: CKTOTAL, CKMB, CKMBINDEX, TROPONINI,  in the last 168 hours BNP (last 3 results)  Recent Labs  05/18/13 1642  PROBNP 196.5   CBG:  Recent Labs Lab 10/09/13 0849  GLUCAP 102*    Recent Results (from the past 240 hour(s))  MRSA PCR SCREENING     Status: None   Collection Time    10/15/13  4:07 AM      Result Value Ref Range Status   MRSA by PCR NEGATIVE  NEGATIVE Final   Comment:            The GeneXpert MRSA Assay (FDA     approved for NASAL specimens     only), is one component of a     comprehensive MRSA colonization     surveillance program. It is not     intended to diagnose MRSA     infection nor to guide or     monitor treatment for     MRSA infections.     Studies:  Recent x-ray studies have been reviewed in detail by the Attending Physician  Scheduled Meds:  Scheduled Meds: . antiseptic oral rinse  15 mL Mouth Rinse BID  . pantoprazole  40 mg Oral Daily  . rOPINIRole  0.25 mg Oral QHS  . sodium chloride  1,000 mL Intravenous Once    Time spent on care of this patient: 40 mins   Allie Bossier , MD   [facility] Office  [phone] Pager [phone]  On-Call/Text Page:      [email or web address]      password [email or web address]  If 7PM-7AM, please contact night-coverage [email or web address] Password [email or web address] 10/15/2013, 10:26 AM   LOS: 1 day

## 2013-10-16 ENCOUNTER — Encounter (HOSPITAL_COMMUNITY): Payer: Medicare Other | Admitting: Anesthesiology

## 2013-10-16 ENCOUNTER — Inpatient Hospital Stay (HOSPITAL_COMMUNITY): Payer: Medicare Other | Admitting: Anesthesiology

## 2013-10-16 ENCOUNTER — Encounter (HOSPITAL_COMMUNITY): Payer: Self-pay

## 2013-10-16 ENCOUNTER — Encounter (HOSPITAL_COMMUNITY)
Admission: EM | Disposition: A | Discharge status: Home / Self Care | Payer: Self-pay | Source: Home / Self Care | Attending: Internal Medicine

## 2013-10-16 DIAGNOSIS — K219 Gastro-esophageal reflux disease without esophagitis: Secondary | ICD-10-CM

## 2013-10-16 DIAGNOSIS — C7A1 Malignant poorly differentiated neuroendocrine tumors: Secondary | ICD-10-CM

## 2013-10-16 DIAGNOSIS — K648 Other hemorrhoids: Secondary | ICD-10-CM

## 2013-10-16 DIAGNOSIS — K642 Third degree hemorrhoids: Secondary | ICD-10-CM | POA: Diagnosis present

## 2013-10-16 DIAGNOSIS — E785 Hyperlipidemia, unspecified: Secondary | ICD-10-CM

## 2013-10-16 DIAGNOSIS — D6859 Other primary thrombophilia: Secondary | ICD-10-CM

## 2013-10-16 DIAGNOSIS — C787 Secondary malignant neoplasm of liver and intrahepatic bile duct: Secondary | ICD-10-CM

## 2013-10-16 DIAGNOSIS — C3430 Malignant neoplasm of lower lobe, unspecified bronchus or lung: Secondary | ICD-10-CM

## 2013-10-16 HISTORY — PX: COLONOSCOPY: SHX5424

## 2013-10-16 LAB — CBC WITH DIFFERENTIAL/PLATELET
BASOS ABS: 0 10*3/uL (ref 0.0–0.1)
Basophils Relative: 0 % (ref 0–1)
EOS ABS: 0.2 10*3/uL (ref 0.0–0.7)
EOS PCT: 2 % (ref 0–5)
HEMATOCRIT: 34.9 % — AB (ref 36.0–46.0)
Hemoglobin: 11.3 g/dL — ABNORMAL LOW (ref 12.0–15.0)
Lymphocytes Relative: 16 % (ref 12–46)
Lymphs Abs: 1.2 10*3/uL (ref 0.7–4.0)
MCH: 29.6 pg (ref 26.0–34.0)
MCHC: 32.4 g/dL (ref 30.0–36.0)
MCV: 91.4 fL (ref 78.0–100.0)
MONO ABS: 0.2 10*3/uL (ref 0.1–1.0)
Monocytes Relative: 3 % (ref 3–12)
Neutro Abs: 5.7 10*3/uL (ref 1.7–7.7)
Neutrophils Relative %: 79 % — ABNORMAL HIGH (ref 43–77)
PLATELETS: 134 10*3/uL — AB (ref 150–400)
RBC: 3.82 MIL/uL — ABNORMAL LOW (ref 3.87–5.11)
RDW: 15.9 % — AB (ref 11.5–15.5)
WBC: 7.2 10*3/uL (ref 4.0–10.5)

## 2013-10-16 LAB — COMPREHENSIVE METABOLIC PANEL
ALBUMIN: 2.8 g/dL — AB (ref 3.5–5.2)
ALT: 18 U/L (ref 0–35)
AST: 512 U/L — AB (ref 0–37)
Alkaline Phosphatase: 40 U/L (ref 39–117)
BILIRUBIN TOTAL: 0.7 mg/dL (ref 0.3–1.2)
BUN: 20 mg/dL (ref 6–23)
CHLORIDE: 104 meq/L (ref 96–112)
CO2: 22 mEq/L (ref 19–32)
Calcium: 9.3 mg/dL (ref 8.4–10.5)
Creatinine, Ser: 0.88 mg/dL (ref 0.50–1.10)
GFR calc Af Amer: 70 mL/min — ABNORMAL LOW (ref >90)
GFR calc non Af Amer: 60 mL/min — ABNORMAL LOW (ref >90)
Glucose, Bld: 98 mg/dL (ref 70–99)
Potassium: 4.7 mEq/L (ref 3.7–5.3)
Sodium: 139 mEq/L (ref 137–147)
Total Protein: 6.5 g/dL (ref 6.0–8.3)

## 2013-10-16 LAB — HEMOGLOBIN AND HEMATOCRIT, BLOOD
HCT: 34.7 % — ABNORMAL LOW (ref 36.0–46.0)
HCT: 37 % (ref 36.0–46.0)
HEMATOCRIT: 33.2 % — AB (ref 36.0–46.0)
HEMOGLOBIN: 10.8 g/dL — AB (ref 12.0–15.0)
Hemoglobin: 11.1 g/dL — ABNORMAL LOW (ref 12.0–15.0)
Hemoglobin: 11.8 g/dL — ABNORMAL LOW (ref 12.0–15.0)

## 2013-10-16 LAB — MAGNESIUM: MAGNESIUM: 2 mg/dL (ref 1.5–2.5)

## 2013-10-16 SURGERY — COLONOSCOPY
Anesthesia: Monitor Anesthesia Care

## 2013-10-16 MED ORDER — TBO-FILGRASTIM 300 MCG/0.5ML ~~LOC~~ SOSY
300.0000 ug | PREFILLED_SYRINGE | Freq: Every day | SUBCUTANEOUS | Status: DC
  Filled 2013-10-16 (×2): qty 0.5

## 2013-10-16 MED ORDER — METOPROLOL TARTRATE 25 MG PO TABS
25.0000 mg | ORAL_TABLET | Freq: Two times a day (BID) | ORAL | Status: DC
  Administered 2013-10-16 – 2013-10-17 (×2): 25 mg via ORAL
  Filled 2013-10-16 (×3): qty 1

## 2013-10-16 MED ORDER — LACTATED RINGERS IV SOLN
INTRAVENOUS | Status: DC
  Administered 2013-10-16: 1000 mL via INTRAVENOUS

## 2013-10-16 MED ORDER — LACTATED RINGERS IV SOLN
INTRAVENOUS | Status: DC | PRN
  Administered 2013-10-16: 14:00:00 via INTRAVENOUS

## 2013-10-16 MED ORDER — PROPOFOL 10 MG/ML IV BOLUS
INTRAVENOUS | Status: DC | PRN
  Administered 2013-10-16 (×3): 20 mg via INTRAVENOUS

## 2013-10-16 MED ORDER — HYDROCORTISONE ACETATE 25 MG RE SUPP
25.0000 mg | Freq: Two times a day (BID) | RECTAL | Status: DC
  Filled 2013-10-16: qty 1

## 2013-10-16 MED ORDER — PANTOPRAZOLE SODIUM 40 MG PO TBEC
40.0000 mg | DELAYED_RELEASE_TABLET | Freq: Two times a day (BID) | ORAL | Status: DC
  Administered 2013-10-16 – 2013-10-17 (×2): 40 mg via ORAL
  Filled 2013-10-16 (×2): qty 1

## 2013-10-16 MED ORDER — HYDROCORTISONE 2.5 % RE CREA
TOPICAL_CREAM | Freq: Three times a day (TID) | RECTAL | Status: DC
  Administered 2013-10-16 – 2013-10-17 (×4): via RECTAL
  Filled 2013-10-16: qty 28.35

## 2013-10-16 MED ORDER — PROPOFOL 10 MG/ML IV EMUL
INTRAVENOUS | Status: DC | PRN
  Administered 2013-10-16: 50 ug/kg/min via INTRAVENOUS

## 2013-10-16 MED ORDER — FENTANYL CITRATE 0.05 MG/ML IJ SOLN: 25.0000 ug | INTRAMUSCULAR | Status: DC | PRN

## 2013-10-16 NOTE — Progress Notes (Signed)
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 comes in with several episodes today of large brbpr. No abd pain. No rectal pain. No n/v. No fevers. She has had several more episodes while in the ED waiting. Again denies pain.  5/7 states attending chemotherapy on Monday/Tuesday, however on Wednesday was too weak to attend her scheduled chemotherapy.  HPI/Subjective: 5/8  S/P Diagnostic colonoscopy 10/16/2013. Showed grade 3 prolapsed hemorrhoids as source of bleeding; see results below. Dr.Gustav C Magrinat (oncology) saw patient today and explained to patient that she will have an appointment on 5/11 at [address]. Patient resting comfortably in bed in mild abdominal pain consistent with colonoscopy.  Assessment/Plan: GI bleed/grade 3 prolapsed hemorrhoids -5/8  Dr. Carol Ada (GI) , performed diagnostic colonoscopy -5/8Change Protonic 40 mg IV BID to  Protonix 40 mg PO BID -Sitz bath QID; will need to ensure patient able to use this treatment prior to discharge, secondary to having difficulty sitting down in a bath the per family (raised tube sitting on bedside commode?) -Anusol 2.5% rectal cream TID -PT/OT evaluation  Paroxysmal A. Fib -Currently in sinus tachycardia  -Restart  metoprolol 25 mg BID  Diastolic CHF - Continue hold Lasix 40 mg daily   HTN -Hold Norvasc 5 mg -Hold enalapril 10 mg  HLD -Zocor 80 mg daily  CKD -Patient at baseline creatinine. Baseline 1.5-2.2 -5/8 current creatinine= 0.88  COPD -Spiriva daily  Clotting disorder/DVT/PE -Patient on chronic anticoagulation will hold secondary to GI bleed -Hold  all blood thinning agents. Secondary to GI bleed  Lung cancer with metastases to liver/pancreas -Carboplatin 280 mcg daily; will hold -Etoposide 170 mg daily; will hold -Patient has appointment with  Dr.Gustav C Magrinat (oncology) on Monday@1000      Code Status: FULL Family Communication: None  Disposition Plan: Per GI    Consultants: Dr. Carol Ada (GI)   Procedure/Significant Events: CT abdomen pelvis without contrast 10/14/2013 - New hepatomegaly and diffuse heterogeneous attenuation, most compatible diffuse hepatic metastatic disease from pancreaticcancer.  -Interval enlargement of pancreatic head mass, now with central low attenuation suggesting central necrosis.  Diagnostic colonoscopy 10/16/2013 -Grade 3 prolapased hemorrhoids. Source of bleeding.  -Nonbleeding AVMs.  -A couple of small benign polyps.   Culture 10/15/2013 MRSA by PCR negative  Antibiotics: NA  DVT prophylaxis: SCD   Devices NA   LINES / TUBES:  5/6 20 GA right antecubital 5/6 22  GA left hand    Continuous Infusions: . sodium chloride 20 mL/hr at 10/15/13 2027    Objective: VITAL SIGNS: Temp: 98.5 F (36.9 C) (05/08 1321) Temp src: Oral (05/08 1321) BP: 150/66 mmHg (05/08 1500) Pulse Rate: 103 (05/08 1505) SPO2; 98% on 2 L [place] FIO2:   Intake/Output Summary (Last 24 hours) at 10/16/13 1739 Last data filed at 10/16/13 1458  Gross per 24 hour  Intake    560 ml  Output    650 ml  Net    -90 ml     Exam: General: A./O. x4, NAD  Lungs: Clear to auscultation bilaterally without wheezes or crackles Cardiovascular: Regular rate and rhythm without murmur gallop or rub normal S1 and S2 Abdomen: Nontender, nondistended, soft, bowel sounds positive,  no rebound, no ascites, no appreciable mass Extremities: No significant cyanosis, clubbing, or edema bilateral lower extremities  Data Reviewed: Basic Metabolic Panel:  Recent Labs Lab 10/12/13 0812 10/14/13 1519 10/16/13 0148    NA 131* 136* 139  K 4.0 4.8 4.7  CL  --  94* 104  CO2 25 24 22   GLUCOSE 117 118* 98  BUN 25.7 38* 20  CREATININE 1.5* 1.69* 0.88  CALCIUM 10.0 9.9 9.3  MG  --   --  2.0   Liver Function Tests:  Recent Labs Lab 10/12/13 0812 10/14/13 1519 10/16/13 0148  AST 249* 277* 512*  ALT 12 22 18   ALKPHOS 46 54 40  BILITOT 0.59 0.4 0.7  PROT 6.7 7.1 6.5  ALBUMIN 3.1* 3.3* 2.8*   No results found for this basename: LIPASE, AMYLASE,  in the last 168 hours No results found for this basename: AMMONIA,  in the last 168 hours CBC:  Recent Labs Lab 10/12/13 0812 10/14/13 1519  10/15/13 1005 10/15/13 2240 10/16/13 0148 10/16/13 1027 10/16/13 1620  WBC 8.4 10.6*  --   --   --  7.2  --   --   NEUTROABS 5.9  --   --   --   --  5.7  --   --   HGB 12.4 12.3  < > 10.2* 11.3* 11.1*  11.3* 10.8* 11.8*  HCT 37.4 37.4  < > 31.5* 35.2* 34.7*  34.9* 33.2* 37.0  MCV 91.5 91.9  --   --   --  91.4  --   --   PLT 179 212  --   --   --  134*  --   --   < > = values in this interval not displayed. Cardiac Enzymes: No results found for this basename: CKTOTAL, CKMB, CKMBINDEX, TROPONINI,  in the last 168 hours BNP (last 3 results)  Recent Labs  05/18/13 1642  PROBNP 196.5   CBG: No results found for this basename: GLUCAP,  in the last 168 hours  Recent Results (from the past 240 hour(s))  MRSA PCR SCREENING     Status: None   Collection Time    10/15/13  4:07 AM      Result Value Ref Range Status   MRSA by PCR NEGATIVE  NEGATIVE Final   Comment:            The GeneXpert MRSA Assay (FDA     approved for NASAL specimens     only), is one component of a     comprehensive MRSA colonization     surveillance program. It is not     intended to diagnose MRSA     infection nor to guide or     monitor treatment for     MRSA infections.     Studies:  Recent x-ray studies have been reviewed in detail by the Attending Physician  Scheduled Meds:  Scheduled Meds: . antiseptic oral rinse   15 mL Mouth Rinse BID  . hydrocortisone   Rectal TID  . metoprolol tartrate  25 mg Oral BID  . pantoprazole  40 mg Oral BID  . rOPINIRole  0.25 mg Oral QHS  . Tbo-Filgrastim  300 mcg Subcutaneous Q1500  . tiotropium  18 mcg Inhalation Daily    Time spent on care of this patient: 40 mins   Allie Bossier , MD   [facility] Office  [phone] Pager [phone]  On-Call/Text Page:      [email or web address]  password [email or web address]  If 7PM-7AM, please contact night-coverage [email or web address] Password [email or web address] 10/16/2013, 5:39 PM   LOS: 2 days

## 2013-10-16 NOTE — Anesthesia Preprocedure Evaluation (Addendum)
Anesthesia Evaluation  Patient identified by MRN, date of birth, ID band Patient awake    Reviewed: Allergy & Precautions  Airway Mallampati: II TM Distance: >3 FB Neck ROM: Full    Dental  (+) Edentulous Upper, Edentulous Lower   Pulmonary shortness of breath, with exertion and Long-Term Oxygen Therapy, former smoker,  Lung cancer with mets breath sounds clear to auscultation        Cardiovascular hypertension, + Peripheral Vascular Disease and +CHF + dysrhythmias Atrial Fibrillation Rhythm:Regular Rate:Normal     Neuro/Psych    GI/Hepatic Bowel prep,GERD-  ,  Endo/Other  diabetes (diet controlled), Type 2  Renal/GU      Musculoskeletal   Abdominal   Peds  Hematology   Anesthesia Other Findings   Reproductive/Obstetrics                         Anesthesia Physical Anesthesia Plan  ASA: III  Anesthesia Plan: MAC   Post-op Pain Management:    Induction: Intravenous  Airway Management Planned: Simple Face Mask  Additional Equipment:   Intra-op Plan:   Post-operative Plan:   Informed Consent: I have reviewed the patients History and Physical, chart, labs and discussed the procedure including the risks, benefits and alternatives for the proposed anesthesia with the patient or authorized representative who has indicated his/her understanding and acceptance.   Dental advisory given  Plan Discussed with: CRNA and Anesthesiologist  Anesthesia Plan Comments:         Anesthesia Quick Evaluation

## 2013-10-16 NOTE — Anesthesia Postprocedure Evaluation (Signed)
  Anesthesia Post-op Note  Patient: [name]  Procedure(s) Performed: Procedure(s): COLONOSCOPY (N/A)  Patient Location: Endoscopy Unit  Anesthesia Type:MAC  Level of Consciousness: awake  Airway and Oxygen Therapy: Patient Spontanous Breathing  Post-op Pain: mild  Post-op Assessment: Post-op Vital signs reviewed  Post-op Vital Signs: Reviewed  Last Vitals:  Filed Vitals:   10/16/13 1505  BP:   Pulse: 103  Temp:   Resp: 22    Complications: No apparent anesthesia complications

## 2013-10-16 NOTE — Op Note (Signed)
[facility] [place], [id number]   OPERATIVE PROCEDURE REPORT  PATIENT: [name], [name]  MR#: [id number] BIRTHDATE: 30-Sep-1930  GENDER: Female ENDOSCOPIST: Carol Ada, MD ASSISTANT:   Cherylynn Ridges, technician Carmie End, RN PROCEDURE DATE: 10/16/2013 PROCEDURE:   Colonoscopy, diagnostic ASA CLASS:   Class III INDICATIONS: Rectal Bleeding. MEDICATIONS: MAC sedation, administered by CRNA  DESCRIPTION OF PROCEDURE:   After the risks benefits and alternatives of the procedure were thoroughly explained, informed consent was obtained.  A digital rectal exam revealed external hemorrhoids.   A digital rectal exam revealed internal hemorrhoids. The     endoscope was introduced through the anus  and advanced to the cecum, which was identified by the appearance , No adverse events experienced.    The quality of the prep was good. .  The instrument was then slowly withdrawn as the colon was fully examined.   FINDINGS: The colonoscopy was extremely difficult to perform as a result of extreme looping.  Repositioining into the supine position and applying abdominal pressure ultimately allowed for cecal intubation, however, an adequate image was not obtained.  A stable position was not possible for a clear global image of the cecum.  A couple of proximal colon nonbleeding AVMs were identified.  No evidence of any inflammation during this colonoscopy compared to her prior colonoscopy.  No masses, ulcerations, or erosions.  A couple of small benign polyps were noted, but not removed. Retroflexed views revealed large prolapsed internal/external hemorrhoids.     The scope was then withdrawn from the patient and the procedure terminated.  COMPLICATIONS: There were no complications.  IMPRESSION: 1) Grade 3 prolapased hemorrhoids.  This is the source of the hematochezia in light of the negative colonic findings. 2) Nonbleeding AVMs. 3) A couple  of small benign polyps.  RECOMMENDATIONS: 1) Anusol ointment for the hemorrhoids. 2) Sitz baths.  _______________________________ Lorrin MaisCarol Ada, MD 10/16/2013 3:07 PM

## 2013-10-16 NOTE — Transfer of Care (Signed)
Immediate Anesthesia Transfer of Care Note  Patient: [name]  Procedure(s) Performed: Procedure(s): COLONOSCOPY (N/A)  Patient Location: PACU and Endoscopy Unit  Anesthesia Type:MAC  Level of Consciousness: sedated  Airway & Oxygen Therapy: Patient Spontanous Breathing and Patient connected to nasal cannula oxygen  Post-op Assessment: Report given to PACU RN and Post -op Vital signs reviewed and stable  Post vital signs: Reviewed and stable  Complications: No apparent anesthesia complications

## 2013-10-16 NOTE — H&P (View-Only) (Signed)
Reason for Consult: Hematochezia and melena Referring Physician: [facility]   [name] HPI: This is an 80 year old female with a PMH of colitis in 2012, metastatic small cell lung cancer, HTN, paroxysmal afib, and PE who is admitted for complaints of hematochezia.  Her bleeding started yesterday.  In the past she had issues with hematochezia and it was diagnosed as an active colitis in 2012.  Asacol was used to treat her and her symptoms ultimately resolved.  Over the years she had some complaints of hematochezia that was attributed to her hemorrhoids, however, at this time her bleeding is more profuse.  No reports of diverticula with the prior colonoscopy.   Past Medical History  Diagnosis Date  . Cancer     lung ca, RUL  . CHF (congestive heart failure)   . Hypertension   . Hx pulmonary embolism 03/2011    on coumadin till 02/2011,stopped  due to gi bleed  . GERD (gastroesophageal reflux disease)   . GI bleed   . Colitis     history  . Chest pain 06/30/11    hx blood clot in lung  . Bell's palsy   . PVD (peripheral vascular disease)   . Lung cancer     metachronous stage I nscca RUL  . History of radiation therapy 5/8/10th,14th,16th,and 10/27/2010    RUL lung nscca  . Blood transfusion     during lumbar surgery  . History of chemotherapy 2006-207    carboplatin/VP16 last January 207  . Clotting disorder     DVT/ PE  . Lung cancer 04/01/12    biopsy-lingular nodule-inv carcinoma, minor squamous cell ca  . History of radiation therapy 11/12, 11/14, 04/29/2012    lingular -squamous cell carcinoma, nodule  . Paroxysmal atrial fibrillation   . On continuous oral anticoagulation   . Diabetes mellitus without complication     patient states "diet controlled."    Past Surgical History  Procedure Laterality Date  . Right lower lobe lung resection  2007  . Cataract surgery      b/l  lens implants  . Abdominal hysterectomy      partial age 20  . Shoulder open  rotator cuff repair  04/2003    left  . Appendectomy    . Carpal tunnel release    . Bladder surgery    . Lumbar spine surgery      x3, L4-S1  . Right lung biopsy  2006  . Coronary angioplasty with stent placement  2001    renal stent  . Colonoscopies  2011    polyp removal     Family History  Problem Relation Age of Onset  . Breast cancer Mother   . Uterine cancer Sister   . Lung cancer Sister   . Breast cancer Maternal Grandmother   . Colon cancer Sister   . Uterine cancer Sister     Social History:  reports that she quit smoking about 9 years ago. Her smoking use included Cigarettes. She has a 108 pack-year smoking history. She has never used smokeless tobacco. She reports that she does not drink alcohol or use illicit drugs.  Allergies:  Allergies  Allergen Reactions  . Aspirin Other (See Comments)    Stomach pain    Medications:  Scheduled: . antiseptic oral rinse  15 mL Mouth Rinse BID  . pantoprazole  40 mg Oral Daily  . rOPINIRole  0.25 mg Oral QHS   Continuous:   Results for orders  placed during the hospital encounter of 10/14/13 (from the past 24 hour(s))  PROTIME-INR     Status: Abnormal   Collection Time    10/14/13  3:19 PM      Result Value Ref Range   Prothrombin Time 16.7 (*) 11.6 - 15.2 seconds   INR 1.39  0.00 - 1.49  CBC     Status: Abnormal   Collection Time    10/14/13  3:19 PM      Result Value Ref Range   WBC 10.6 (*) 4.0 - 10.5 K/uL   RBC 4.07  3.87 - 5.11 MIL/uL   Hemoglobin 12.3  12.0 - 15.0 g/dL   HCT 37.4  36.0 - 46.0 %   MCV 91.9  78.0 - 100.0 fL   MCH 30.2  26.0 - 34.0 pg   MCHC 32.9  30.0 - 36.0 g/dL   RDW 15.0  11.5 - 15.5 %   Platelets 212  150 - 400 K/uL  COMPREHENSIVE METABOLIC PANEL     Status: Abnormal   Collection Time    10/14/13  3:19 PM      Result Value Ref Range   Sodium 136 (*) 137 - 147 mEq/L   Potassium 4.8  3.7 - 5.3 mEq/L   Chloride 94 (*) 96 - 112 mEq/L   CO2 24  19 - 32 mEq/L   Glucose, Bld 118 (*)  70 - 99 mg/dL   BUN 38 (*) 6 - 23 mg/dL   Creatinine, Ser 1.69 (*) 0.50 - 1.10 mg/dL   Calcium 9.9  8.4 - 10.5 mg/dL   Total Protein 7.1  6.0 - 8.3 g/dL   Albumin 3.3 (*) 3.5 - 5.2 g/dL   AST 277 (*) 0 - 37 U/L   ALT 22  0 - 35 U/L   Alkaline Phosphatase 54  39 - 117 U/L   Total Bilirubin 0.4  0.3 - 1.2 mg/dL   GFR calc non Af Amer 27 (*) >90 mL/min   GFR calc Af Amer 32 (*) >90 mL/min  TYPE AND SCREEN     Status: None   Collection Time    10/14/13  3:25 PM      Result Value Ref Range   ABO/RH(D) O POS     Antibody Screen NEG     Sample Expiration 10/17/2013     Unit Number [id number]     Blood Component Type RBC LR PHER1     Unit division 00     Status of Unit ISSUED     Transfusion Status OK TO TRANSFUSE     Crossmatch Result Compatible     Unit Number [id number]     Blood Component Type RBC LR PHER2     Unit division 00     Status of Unit ALLOCATED     Transfusion Status OK TO TRANSFUSE     Crossmatch Result Compatible    I-STAT CG4 LACTIC ACID, ED     Status: None   Collection Time    10/14/13  3:39 PM      Result Value Ref Range   Lactic Acid, Venous 1.70  0.5 - 2.2 mmol/L  HEMOGLOBIN AND HEMATOCRIT, BLOOD     Status: Abnormal   Collection Time    10/15/13  3:19 AM      Result Value Ref Range   Hemoglobin 9.7 (*) 12.0 - 15.0 g/dL   HCT 30.3 (*) 36.0 - 46.0 %  MRSA PCR SCREENING     Status: None  Collection Time    10/15/13  4:07 AM      Result Value Ref Range   MRSA by PCR NEGATIVE  NEGATIVE  HEMOGLOBIN AND HEMATOCRIT, BLOOD     Status: Abnormal   Collection Time    10/15/13 10:05 AM      Result Value Ref Range   Hemoglobin 10.2 (*) 12.0 - 15.0 g/dL   HCT 31.5 (*) 36.0 - 46.0 %  PREPARE RBC (CROSSMATCH)     Status: None   Collection Time    10/15/13 10:27 AM      Result Value Ref Range   Order Confirmation ORDER PROCESSED BY BLOOD BANK       Ct Abdomen Pelvis Wo Contrast  10/14/2013   CLINICAL DATA:  Bleeding from the rectum.  EXAM: CT ABDOMEN  AND PELVIS WITHOUT CONTRAST  TECHNIQUE: Multidetector CT imaging of the abdomen and pelvis was performed following the standard protocol without IV contrast.  COMPARISON:  US BIOPSY dated 08/28/2013; MR ABDOMEN W/O CM dated 07/24/2013; NM PET IMAGE RESTAG (PS) SKULL BASE TO THIGH dated 07/03/2013; CT ABD/PELVIS W CM dated 12/30/2012  FINDINGS: Bones: Postoperative changes from L5 through S1. No evidence of skeletal metastases. No aggressive osseous lesions identified.  Lung Bases: Atelectasis and probable scarring in the left lower lobe and lingula. Coronary artery atherosclerosis and aortic atherosclerosis. Extensive calcification of the tracheobronchial tree and old granulomatous calcifications.  Liver: Unenhanced CT was performed per clinician order. Lack of IV contrast limits sensitivity and specificity, especially for evaluation of abdominal/pelvic solid viscera.  Hepatomegaly is present which is new compared to the prior MRI of the abdomen and there is a diffuse nodular contour of the liver, due to diffuse metastatic disease. On prior MRI, the liver span was under 18 cm. On today's examination, the liver span is about 24 cm. Cirrhosis developing in this time interval since the MRI is unlikely.  Spleen:  Normal.  Gallbladder:  Contracted.  Common bile duct:  Grossly normal.  Pancreas: Larger mass in the head of the pancreas, with central low attenuation compatible with central necrosis. Tail the pancreas appears within normal limits. No definite ductal obstruction or evidence pancreatitis.  Adrenal glands: Stable nodule in the left adrenal gland. Right adrenal normal.  Kidneys: Right inferior pole exophytic lesions, most compatible with cysts. Vascular calcifications in the renal hila bilaterally. The ureters appear within normal limits.  Stomach: Mild distention of the stomach with oral contrast. No inflammatory changes.  Small bowel: Duodenum and small bowel grossly appears within normal limits. Pancreatic head  mass extends up to the second part of the duodenum.  Colon: No inflammatory changes of colon. The distal colon is decompressed. No colonic mass lesion is identified.  Pelvic Genitourinary: Urinary bladder appears normal. Hysterectomy. No free fluid. Ovaries appear within normal limits.  Vasculature: Atherosclerosis without gross acute vascular abnormality. 27 mm juxta renal abdominal aortic aneurysm. Distal infrarenal abdominal aortic stenosis with dense calcification. Portal vein grossly appears normal for noncontrast CT.  Body Wall: Normal.  IMPRESSION: 1. New hepatomegaly and diffuse heterogeneous attenuation, most compatible with diffuse hepatic metastatic disease from pancreatic cancer. 2. Interval enlargement of pancreatic head mass, now with central low attenuation suggesting central necrosis. 3. Juxtarenal abdominal aortic aneurysm measuring 27 mm. Ectatic abdominal aorta at risk for aneurysm development. Recommend follow up by [place] in 5 years. This recommendation follows ACR consensus guidelines: White Paper of the ACR Incidental Findings Committee II on Vascular Findings. J Am Coll Radiol 2013; 10:789-794. Inferior  stretched infrarenal abdominal aortic stenosis. 4. No colonic mass is identified in this patient with rectal bleeding. The distal colon is decompressed, probably secondary to the cathartic affects of GI bleed.   Electronically Signed   By: Dereck Ligas M.D.   On: 10/14/2013 20:07    ROS:  As stated above in the HPI otherwise negative.  Blood pressure 106/49, pulse 94, temperature 98.3 F (36.8 C), temperature source Oral, resp. rate 22, height 5\' 5"  (1.651 m), weight 216 lb 7.9 oz (98.2 kg), SpO2 98.00%.    PE: Gen: NAD, Alert and Oriented HEENT:  [place]/AT, EOMI Neck: Supple, no LAD Lungs: CTA Bilaterally CV: RRR without M/G/R ABM: Soft, NTND, +BS Ext: No C/C/E  Assessment/Plan: 1) Hematochezia. 2) Metastatic lung cancer.   The patient desires to know the source of the  bleeding.  Despite her metastatic disease, she appears to be in reasonable health to undergo a colonoscopy.  Plan: 1) Colonoscopy tomorrow.  If a source is not identified, I will perform an EGD.  Beryle Beams 10/15/2013, 1:53 PM

## 2013-10-16 NOTE — Interval H&P Note (Signed)
History and Physical Interval Note:  10/16/2013 1:45 PM  [name]  has presented today for surgery, with the diagnosis of Hematochezia  The various methods of treatment have been discussed with the patient and family. After consideration of risks, benefits and other options for treatment, the patient has consented to  Procedure(s): COLONOSCOPY (N/A) as a surgical intervention .  The patient's history has been reviewed, patient examined, no change in status, stable for surgery.  I have reviewed the patient's chart and labs.  Questions were answered to the patient's satisfaction.     Beryle Beams

## 2013-10-16 NOTE — Progress Notes (Signed)
[facility] OFFICE PROGRESS NOTE   PCP: [name], NP GYN: SU:  OTHER MD: Tyler Pita  Chief Complaint  Patient presents with  . Rectal Bleeding    Chief complaint: "I started bleeding again."  INTERVAL HISTORY: Kismet is currently day 5 cycle 1 of every-three-week carboplatin/ etoposide at reduced doses for her recurrent, widely metastatic neuroendocrine lung cancer, Her day 3 treatment was not given as the patient presented to the ED with BRBPR, "just gushing," as the patient puts it. She also did not receive the day 4 neulasta. As of today she has been stabilized, the bleeding has stopped, and she is scheduled for colonoscopy this afternoon   REVIEW OF SYSTEMS:   She c/o severe headache, present "all night," but not before. Denies nausea, vomiting, visual changes. Tells me she has had no bleeding "since I had to drink all that" prep. Denies cough, phlegm production, pleurisy or hemoptysis. No family in room.   Lung cancer history: From Dr. Rolan Bucco  07/30/2012 note:  "1. 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  2. Squamous cell carcinoma of the right lower lobe T1 resected at the  time of surgery on 08/09/2005. All resected lymph nodes were  negative.  3. Non small cell lung cancer of the right upper lobe detected in March  2012, +NABx 09/21/10, with positive PET scan treated with stereotactic  radiation 5000 cGy in 5 fractions from 10/17/2010 through 10/27/2010.  4. Lingular nodule which appears to be increasing in size. This  lesion may have been present on a CT scan of the chest going back  to 08/30/2010. A PET scan carried out on 03/14/2012 showed no evidence of metastatic disease. Core needle biopsy of the  lingular nodule was carried out on 04/01/2012 and showed invasive poorly differentiated carcinoma with predominant small cell carcinoma component and minor squamous cell component. Stains for p63, chromogranin and synaptophysin were diffusely positive. Cytokeratin 5/6 was positive in the squamous cell carcinoma component, but negative in the rest of the tumor. The small cell component comprised about 90%  of the tumor. Slides were reviewed with Dr. Lynnell Chad. The lesion was  treated with stereotactic body radiotherapy (SBRT), 18 Gy per session, for  a total of 3 sessions, administered on 04/22/2012, 04/24/2012, and 04/29/2012, for a total of 54 Gy. The patient started chemotherapy with carboplatin and VP-16 on 05/19/2012 for a planned 4 cycles. Cycle #4 was completed on 07/31/2012."  More recently the patient was evaluated by Dr.Khan, after the patient presented to the emergency room with a cough and chest pain and found to have several cracked ribs. A CT angiogram found a right lower lobe nodule concerning for recurrence and she was referred back to oncology for further evaluation. PET scan 07/03/2013 showed, aside from 2 right lung nodules which were nonspecific, new hypermetabolic activity within the head of the pancreas. MRI of the abdomen obtained 07/24/2013 confirmed a soft tissue mass centered at the ventral aspect of the pancreatic head measuring 3.3 cm, as well as innumerable liver lesions which were new as compared to July of 2014. On 08/28/2013 the patient underwent right liver lobe biopsy, which showed (SZ [phone] [id number]) a metastatic poorly differentiated neuroendocrine carcinoma which was positive for CD 56, chromogranin, and synaptophysin. TTF-1 was positive,  consistent with a lung primary.  The patient's subsequent history is as detailed below  MEDICAL HISTORY: Past Medical History  Diagnosis Date  . Cancer     lung ca, RUL  . CHF (congestive heart failure)   . Hypertension   . Hx  pulmonary embolism 03/2011    on coumadin till 02/2011,stopped  due to gi bleed  . GERD (gastroesophageal reflux disease)   . GI bleed   . Colitis     history  . Chest pain 06/30/11    hx blood clot in lung  . Bell's palsy   . PVD (peripheral vascular disease)   . Lung cancer     metachronous stage I nscca RUL  . History of radiation therapy 5/8/10th,14th,16th,and 10/27/2010    RUL lung nscca  . Blood transfusion     during lumbar surgery  . History of chemotherapy 2006-207    carboplatin/VP16 last January 207  . Clotting disorder     DVT/ PE  . Lung cancer 04/01/12    biopsy-lingular nodule-inv carcinoma, minor squamous cell ca  . History of radiation therapy 11/12, 11/14, 04/29/2012    lingular -squamous cell carcinoma, nodule  . Paroxysmal atrial fibrillation   . On continuous oral anticoagulation   . Diabetes mellitus without complication     patient states "diet controlled."   ALLERGIES:  is allergic to aspirin.  MEDICATIONS: has a current medication list which includes the following prescription(s): acetaminophen, alendronate, amlodipine, benazepril, cimetidine, diphenhydramine-acetaminophen, fenofibrate, furosemide, methocarbamol, metoprolol tartrate, omega-3 acid ethyl esters, oxycodone-acetaminophen, pantoprazole, ropinirole, sennosides-docusate sodium, simvastatin, tiotropium, and warfarin.  SURGICAL HISTORY:  Past Surgical History  Procedure Laterality Date  . Right lower lobe lung resection  2007  . Cataract surgery      b/l  lens implants  . Abdominal hysterectomy      partial age 79  . Shoulder open rotator cuff repair  04/2003    left  . Appendectomy    . Carpal tunnel release    . Bladder surgery    . Lumbar spine surgery      x3, L4-S1  . Right lung biopsy  2006  . Coronary angioplasty with stent placement  2001    renal stent  . 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 She is a widow and lives with her daughter [name], who is herself disabled. [name] has a history of breast cancer dating back to 2005 and apparently developed congestive heart failure. The patient has 2 other children, her son [name] who lives in [place] and is disabled secondary to back pain and Vickki Hearing who lives in [place] and "has an anger problem". The patient has 2 biological and 2 "courtesy" grandchildren and 4 great-grandchildren. She is a Psychologist, forensic.  ADVANCED DIRECTIVES: On her 10/02/2013 visit the patient was given healthcare power of attorney documents which she completed and notarized. She has named her daughter [name] as her healthcare power of attorney. [name] can be reached at the patient's home phone. At that same visit we also discussed end-of-life issues. The patient is very optimistic and wishes to have any treatment that may prolong her life or make her life better. She agrees that in case of a terminal event she should not be "placed on machines or life support". Her daughter was present during this discussion.  PHYSICAL EXAMINATION: BP 130/50  Pulse  108  Temp(Src) 97.9 F (36.6 C) (Oral)  Resp 21  Ht _0  (1.651 m)  Wt 216 lb 7.9 oz (98.2 kg)  BMI 36.03 kg/m2  SpO2 96%  ECOG PERFORMANCE STATUS: 3 - Symptomatic, >50% confined to bed  Sclerae unicteric Oxygen by nasal cannula In place  No cervical or supraclavicular adenopathy Lungs no rales or rhonchi, auscultated anterolaterally Heart regular rate and rhythm Abd soft, obese, nontender, positive bowel sounds Neuro: nonfocal, well oriented, appropriate affect Breasts: Deferred    LABORATORY DATA: Results for orders placed during the hospital encounter of 10/14/13 (from the past 48 hour(s))  PROTIME-INR     Status: Abnormal   Collection Time    10/14/13  3:19 PM      Result Value Ref Range   Prothrombin Time 16.7 (*) 11.6 - 15.2 seconds   INR 1.39  0.00 - 1.49   CBC     Status: Abnormal   Collection Time    10/14/13  3:19 PM      Result Value Ref Range   WBC 10.6 (*) 4.0 - 10.5 K/uL   RBC 4.07  3.87 - 5.11 MIL/uL   Hemoglobin 12.3  12.0 - 15.0 g/dL   HCT 37.4  36.0 - 46.0 %   MCV 91.9  78.0 - 100.0 fL   MCH 30.2  26.0 - 34.0 pg   MCHC 32.9  30.0 - 36.0 g/dL   RDW 15.0  11.5 - 15.5 %   Platelets 212  150 - 400 K/uL  COMPREHENSIVE METABOLIC PANEL     Status: Abnormal   Collection Time    10/14/13  3:19 PM      Result Value Ref Range   Sodium 136 (*) 137 - 147 mEq/L   Potassium 4.8  3.7 - 5.3 mEq/L   Chloride 94 (*) 96 - 112 mEq/L   CO2 24  19 - 32 mEq/L   Glucose, Bld 118 (*) 70 - 99 mg/dL   BUN 38 (*) 6 - 23 mg/dL   Creatinine, Ser 1.69 (*) 0.50 - 1.10 mg/dL   Calcium 9.9  8.4 - 10.5 mg/dL   Total Protein 7.1  6.0 - 8.3 g/dL   Albumin 3.3 (*) 3.5 - 5.2 g/dL   AST 277 (*) 0 - 37 U/L   Comment: HEMOLYSIS AT THIS LEVEL MAY AFFECT RESULT   ALT 22  0 - 35 U/L   Alkaline Phosphatase 54  39 - 117 U/L   Total Bilirubin 0.4  0.3 - 1.2 mg/dL   GFR calc non Af Amer 27 (*) >90 mL/min   GFR calc Af Amer 32 (*) >90 mL/min   Comment: (NOTE)     The eGFR has been calculated using the CKD EPI equation.     This calculation has not been validated in all clinical situations.     eGFR's persistently <90 mL/min signify possible Chronic Kidney     Disease.  TYPE AND SCREEN     Status: None   Collection Time    10/14/13  3:25 PM      Result Value Ref Range   ABO/RH(D) O POS     Antibody Screen NEG     Sample Expiration 10/17/2013     Unit Number [id number]     Blood Component Type RBC LR PHER1     Unit division 00     Status of Unit ISSUED     Transfusion Status OK TO TRANSFUSE     Crossmatch Result Compatible  Unit Number [id number]     Blood Component Type RBC LR PHER2     Unit division 00     Status of Unit ALLOCATED     Transfusion Status OK TO TRANSFUSE     Crossmatch Result Compatible    I-STAT CG4 LACTIC ACID, ED      Status: None   Collection Time    10/14/13  3:39 PM      Result Value Ref Range   Lactic Acid, Venous 1.70  0.5 - 2.2 mmol/L  HEMOGLOBIN AND HEMATOCRIT, BLOOD     Status: Abnormal   Collection Time    10/15/13  3:19 AM      Result Value Ref Range   Hemoglobin 9.7 (*) 12.0 - 15.0 g/dL   Comment: DELTA CHECK NOTED     REPEATED TO VERIFY   HCT 30.3 (*) 36.0 - 46.0 %  MRSA PCR SCREENING     Status: None   Collection Time    10/15/13  4:07 AM      Result Value Ref Range   MRSA by PCR NEGATIVE  NEGATIVE   Comment:            The GeneXpert MRSA Assay (FDA     approved for NASAL specimens     only), is one component of a     comprehensive MRSA colonization     surveillance program. It is not     intended to diagnose MRSA     infection nor to guide or     monitor treatment for     MRSA infections.  HEMOGLOBIN AND HEMATOCRIT, BLOOD     Status: Abnormal   Collection Time    10/15/13 10:05 AM      Result Value Ref Range   Hemoglobin 10.2 (*) 12.0 - 15.0 g/dL   HCT 31.5 (*) 36.0 - 46.0 %  PREPARE RBC (CROSSMATCH)     Status: None   Collection Time    10/15/13 10:27 AM      Result Value Ref Range   Order Confirmation ORDER PROCESSED BY BLOOD BANK    HEMOGLOBIN AND HEMATOCRIT, BLOOD     Status: Abnormal   Collection Time    10/15/13 10:40 PM      Result Value Ref Range   Hemoglobin 11.3 (*) 12.0 - 15.0 g/dL   HCT 35.2 (*) 36.0 - 46.0 %  HEMOGLOBIN AND HEMATOCRIT, BLOOD     Status: Abnormal   Collection Time    10/16/13  1:48 AM      Result Value Ref Range   Hemoglobin 11.1 (*) 12.0 - 15.0 g/dL   HCT 34.7 (*) 36.0 - 46.0 %  COMPREHENSIVE METABOLIC PANEL     Status: Abnormal   Collection Time    10/16/13  1:48 AM      Result Value Ref Range   Sodium 139  137 - 147 mEq/L   Potassium 4.7  3.7 - 5.3 mEq/L   Chloride 104  96 - 112 mEq/L   Comment: DELTA CHECK NOTED     NO VISIBLE HEMOLYSIS   CO2 22  19 - 32 mEq/L   Glucose, Bld 98  70 - 99 mg/dL   BUN 20  6 - 23 mg/dL    Comment: DELTA CHECK NOTED   Creatinine, Ser 0.88  0.50 - 1.10 mg/dL   Comment: DELTA CHECK NOTED   Calcium 9.3  8.4 - 10.5 mg/dL   Total Protein 6.5  6.0 - 8.3 g/dL  Albumin 2.8 (*) 3.5 - 5.2 g/dL   AST 512 (*) 0 - 37 U/L   ALT 18  0 - 35 U/L   Alkaline Phosphatase 40  39 - 117 U/L   Total Bilirubin 0.7  0.3 - 1.2 mg/dL   GFR calc non Af Amer 60 (*) >90 mL/min   GFR calc Af Amer 70 (*) >90 mL/min   Comment: (NOTE)     The eGFR has been calculated using the CKD EPI equation.     This calculation has not been validated in all clinical situations.     eGFR's persistently <90 mL/min signify possible Chronic Kidney     Disease.  CBC WITH DIFFERENTIAL     Status: Abnormal   Collection Time    10/16/13  1:48 AM      Result Value Ref Range   WBC 7.2  4.0 - 10.5 K/uL   RBC 3.82 (*) 3.87 - 5.11 MIL/uL   Hemoglobin 11.3 (*) 12.0 - 15.0 g/dL   HCT 34.9 (*) 36.0 - 46.0 %   MCV 91.4  78.0 - 100.0 fL   MCH 29.6  26.0 - 34.0 pg   MCHC 32.4  30.0 - 36.0 g/dL   RDW 15.9 (*) 11.5 - 15.5 %   Platelets 134 (*) 150 - 400 K/uL   Comment: DELTA CHECK NOTED     REPEATED TO VERIFY   Neutrophils Relative % 79 (*) 43 - 77 %   Neutro Abs 5.7  1.7 - 7.7 K/uL   Lymphocytes Relative 16  12 - 46 %   Lymphs Abs 1.2  0.7 - 4.0 K/uL   Monocytes Relative 3  3 - 12 %   Monocytes Absolute 0.2  0.1 - 1.0 K/uL   Eosinophils Relative 2  0 - 5 %   Eosinophils Absolute 0.2  0.0 - 0.7 K/uL   Basophils Relative 0  0 - 1 %   Basophils Absolute 0.0  0.0 - 0.1 K/uL  MAGNESIUM     Status: None   Collection Time    10/16/13  1:48 AM      Result Value Ref Range   Magnesium 2.0  1.5 - 2.5 mg/dL    RADIOGRAPHIC STUDIES: Ct Abdomen Pelvis Wo Contrast  10/14/2013   CLINICAL DATA:  Bleeding from the rectum.  EXAM: CT ABDOMEN AND PELVIS WITHOUT CONTRAST  TECHNIQUE: Multidetector CT imaging of the abdomen and pelvis was performed following the standard protocol without IV contrast.  COMPARISON:  US BIOPSY dated 08/28/2013;  MR ABDOMEN W/O CM dated 07/24/2013; NM PET IMAGE RESTAG (PS) SKULL BASE TO THIGH dated 07/03/2013; CT ABD/PELVIS W CM dated 12/30/2012  FINDINGS: Bones: Postoperative changes from L5 through S1. No evidence of skeletal metastases. No aggressive osseous lesions identified.  Lung Bases: Atelectasis and probable scarring in the left lower lobe and lingula. Coronary artery atherosclerosis and aortic atherosclerosis. Extensive calcification of the tracheobronchial tree and old granulomatous calcifications.  Liver: Unenhanced CT was performed per clinician order. Lack of IV contrast limits sensitivity and specificity, especially for evaluation of abdominal/pelvic solid viscera.  Hepatomegaly is present which is new compared to the prior MRI of the abdomen and there is a diffuse nodular contour of the liver, due to diffuse metastatic disease. On prior MRI, the liver span was under 18 cm. On today's examination, the liver span is about 24 cm. Cirrhosis developing in this time interval since the MRI is unlikely.  Spleen:  Normal.  Gallbladder:  Contracted.  Common  bile duct:  Grossly normal.  Pancreas: Larger mass in the head of the pancreas, with central low attenuation compatible with central necrosis. Tail the pancreas appears within normal limits. No definite ductal obstruction or evidence pancreatitis.  Adrenal glands: Stable nodule in the left adrenal gland. Right adrenal normal.  Kidneys: Right inferior pole exophytic lesions, most compatible with cysts. Vascular calcifications in the renal hila bilaterally. The ureters appear within normal limits.  Stomach: Mild distention of the stomach with oral contrast. No inflammatory changes.  Small bowel: Duodenum and small bowel grossly appears within normal limits. Pancreatic head mass extends up to the second part of the duodenum.  Colon: No inflammatory changes of colon. The distal colon is decompressed. No colonic mass lesion is identified.  Pelvic Genitourinary: Urinary  bladder appears normal. Hysterectomy. No free fluid. Ovaries appear within normal limits.  Vasculature: Atherosclerosis without gross acute vascular abnormality. 27 mm juxta renal abdominal aortic aneurysm. Distal infrarenal abdominal aortic stenosis with dense calcification. Portal vein grossly appears normal for noncontrast CT.  Body Wall: Normal.  IMPRESSION: 1. New hepatomegaly and diffuse heterogeneous attenuation, most compatible with diffuse hepatic metastatic disease from pancreatic cancer. 2. Interval enlargement of pancreatic head mass, now with central low attenuation suggesting central necrosis. 3. Juxtarenal abdominal aortic aneurysm measuring 27 mm. Ectatic abdominal aorta at risk for aneurysm development. Recommend follow up by [place] in 5 years. This recommendation follows ACR consensus guidelines: White Paper of the ACR Incidental Findings Committee II on Vascular Findings. J Am Coll Radiol 2013; 10:789-794. Inferior stretched infrarenal abdominal aortic stenosis. 4. No colonic mass is identified in this patient with rectal bleeding. The distal colon is decompressed, probably secondary to the cathartic affects of GI bleed.   Electronically Signed   By: Dereck Ligas M.D.   On: 10/14/2013 20:07   Ir Fluoro Guide Cv Line Right  10/09/2013   CLINICAL DATA:  Lung carcinoma and need for porta cath to begin chemotherapy.  EXAM: IMPLANTED PORT A CATH PLACEMENT WITH ULTRASOUND AND FLUOROSCOPIC GUIDANCE  ANESTHESIA/SEDATION: 2.0 Mg IV Versed; 50 mcg IV Fentanyl  Total Moderate Sedation Time:  30 minutes  Additional Medications: 2 g IV Ancef. As antibiotic prophylaxis, Ancef was ordered pre-procedure and administered intravenously within one hour of incision.  FLUOROSCOPY TIME:  30 seconds.  PROCEDURE: The procedure, risks, benefits, and alternatives were explained to the patient. Questions regarding the procedure were encouraged and answered. The patient understands and consents to the procedure.  The right  neck and chest were prepped with chlorhexidine in a sterile fashion, and a sterile drape was applied covering the operative field. Maximum barrier sterile technique with sterile gowns and gloves were used for the procedure. Local anesthesia was provided with 1% lidocaine. Ultrasound was used to confirm patency of the right internal jugular vein.  After creating a small venotomy incision, a 21 gauge needle was advanced into the right internal jugular vein under direct, real-time ultrasound guidance. Ultrasound image documentation was performed. After securing guidewire access, an 8 Fr dilator was placed. A J-wire was kinked to measure appropriate catheter length.  A subcutaneous port pocket was then created along the upper chest wall utilizing sharp and blunt dissection. Portable cautery was utilized. The pocket was irrigated with sterile saline.  A single lumen power injectable port was chosen for placement. The 8 Fr catheter was tunneled from the port pocket site to the venotomy incision. The port was placed in the pocket. External catheter was trimmed to appropriate length based on  guidewire measurement.  At the venotomy, an 8 Fr peel-away sheath was placed over a guidewire. The catheter was then placed through the sheath and the sheath removed. Final catheter positioning was confirmed and documented with a fluoroscopic spot image. The port was accessed with a needle and aspirated and flushed with heparinized saline. The needle was removed.  The venotomy and port pocket incisions were closed with subcutaneous 3-0 Monocryl and subcuticular 4-0 Vicryl. Dermabond was applied to both incisions.  Complications: None. No pneumothorax.  FINDINGS: After catheter placement, the tip lies at the cavoatrial junction. The catheter aspirates normally and is ready for immediate use.  IMPRESSION: Placement of single lumen port a cath via right internal jugular vein. The catheter tip lies at the cavoatrial junction. A power  injectable port a cath was placed and is ready for immediate use.   Electronically Signed   By: Aletta Edouard M.D.   On: 10/09/2013 14:07   Ir US Guide Vasc Access Right  10/09/2013   CLINICAL DATA:  Lung carcinoma and need for porta cath to begin chemotherapy.  EXAM: IMPLANTED PORT A CATH PLACEMENT WITH ULTRASOUND AND FLUOROSCOPIC GUIDANCE  ANESTHESIA/SEDATION: 2.0 Mg IV Versed; 50 mcg IV Fentanyl  Total Moderate Sedation Time:  30 minutes  Additional Medications: 2 g IV Ancef. As antibiotic prophylaxis, Ancef was ordered pre-procedure and administered intravenously within one hour of incision.  FLUOROSCOPY TIME:  30 seconds.  PROCEDURE: The procedure, risks, benefits, and alternatives were explained to the patient. Questions regarding the procedure were encouraged and answered. The patient understands and consents to the procedure.  The right neck and chest were prepped with chlorhexidine in a sterile fashion, and a sterile drape was applied covering the operative field. Maximum barrier sterile technique with sterile gowns and gloves were used for the procedure. Local anesthesia was provided with 1% lidocaine. Ultrasound was used to confirm patency of the right internal jugular vein.  After creating a small venotomy incision, a 21 gauge needle was advanced into the right internal jugular vein under direct, real-time ultrasound guidance. Ultrasound image documentation was performed. After securing guidewire access, an 8 Fr dilator was placed. A J-wire was kinked to measure appropriate catheter length.  A subcutaneous port pocket was then created along the upper chest wall utilizing sharp and blunt dissection. Portable cautery was utilized. The pocket was irrigated with sterile saline.  A single lumen power injectable port was chosen for placement. The 8 Fr catheter was tunneled from the port pocket site to the venotomy incision. The port was placed in the pocket. External catheter was trimmed to appropriate  length based on guidewire measurement.  At the venotomy, an 8 Fr peel-away sheath was placed over a guidewire. The catheter was then placed through the sheath and the sheath removed. Final catheter positioning was confirmed and documented with a fluoroscopic spot image. The port was accessed with a needle and aspirated and flushed with heparinized saline. The needle was removed.  The venotomy and port pocket incisions were closed with subcutaneous 3-0 Monocryl and subcuticular 4-0 Vicryl. Dermabond was applied to both incisions.  Complications: None. No pneumothorax.  FINDINGS: After catheter placement, the tip lies at the cavoatrial junction. The catheter aspirates normally and is ready for immediate use.  IMPRESSION: Placement of single lumen port a cath via right internal jugular vein. The catheter tip lies at the cavoatrial junction. A power injectable port a cath was placed and is ready for immediate use.   Electronically Signed  By: Aletta Edouard M.D.   On: 10/09/2013 14:07   ASSESSMENT: 80 y.o. [place], [place] woman   (1) with a history of neuroendocrine lung cancer (small cell close(biopsied November of 2006, and treated with carboplatin and VP-16 between November of 2006 and January of 2007, followed by right lower lobe lobectomy March of 2007  (2) squamous cell carcinoma of the right lower lobe, T1 N0, resected at the same time as above  (3) non-small cell lung cancer of the right upper lobe diagnosed April of 2012, treated with stereotactic radiation (50 gray in 5 fractions) completed may of 2012  (4) metastatic neuroendocrine lung cancer (recurrence from #1 above) diagnosed by liver biopsy 09/05/2013, currently day 5 cycle 1 of reduced-dose carboplatin/ etoposide, the day 3 treatment cancelled because of intercurrent GIB  PLAN:  I was unable to reach the patient's daughter [name] this morning to discuss the overall situation. From a lung cancer point of view, the plan is to  do two cycles of reduced-dose chemotherapy, then reassess. If we do not document a response, I would favor a Hospice referral (please see the note above regarding end-of-life discussion-- the patient made it clear she wanted treatment "if it would help," but also made it clear she would not want "to be on machines or life support" in case of a terminal event.")   In the current situation a limited resuscitation order (no CPR, no intubation, no defibrillation) would be reasonable in my estimation. This would need to be discussed with the patient and daughter--consider a palliative care consult.  I will write for neupogen as the patient did not receive the day 4 neulasta adn she is at risk of neutropenia over the next week.  Appreciate your help with Ms Morganti! She has an appointment with me for 5/11 at 10 AM. Please let me know if I can be of further help   Chauncey Cruel, MD 10/12/2013 7:55 AM

## 2013-10-17 DIAGNOSIS — J441 Chronic obstructive pulmonary disease with (acute) exacerbation: Secondary | ICD-10-CM

## 2013-10-17 LAB — COMPREHENSIVE METABOLIC PANEL
ALT: 16 U/L (ref 0–35)
AST: 490 U/L — AB (ref 0–37)
Albumin: 2.8 g/dL — ABNORMAL LOW (ref 3.5–5.2)
Alkaline Phosphatase: 36 U/L — ABNORMAL LOW (ref 39–117)
BILIRUBIN TOTAL: 0.6 mg/dL (ref 0.3–1.2)
BUN: 16 mg/dL (ref 6–23)
CHLORIDE: 101 meq/L (ref 96–112)
CO2: 23 mEq/L (ref 19–32)
CREATININE: 0.78 mg/dL (ref 0.50–1.10)
Calcium: 9.5 mg/dL (ref 8.4–10.5)
GFR calc Af Amer: 89 mL/min — ABNORMAL LOW (ref >90)
GFR calc non Af Amer: 77 mL/min — ABNORMAL LOW (ref >90)
Glucose, Bld: 106 mg/dL — ABNORMAL HIGH (ref 70–99)
POTASSIUM: 4.4 meq/L (ref 3.7–5.3)
Sodium: 138 mEq/L (ref 137–147)
TOTAL PROTEIN: 6.3 g/dL (ref 6.0–8.3)

## 2013-10-17 LAB — CBC WITH DIFFERENTIAL/PLATELET
Basophils Absolute: 0 10*3/uL (ref 0.0–0.1)
Basophils Relative: 0 % (ref 0–1)
EOS ABS: 0.1 10*3/uL (ref 0.0–0.7)
EOS PCT: 1 % (ref 0–5)
HEMATOCRIT: 33.5 % — AB (ref 36.0–46.0)
Hemoglobin: 10.8 g/dL — ABNORMAL LOW (ref 12.0–15.0)
LYMPHS ABS: 1.3 10*3/uL (ref 0.7–4.0)
LYMPHS PCT: 18 % (ref 12–46)
MCH: 29.5 pg (ref 26.0–34.0)
MCHC: 32.2 g/dL (ref 30.0–36.0)
MCV: 91.5 fL (ref 78.0–100.0)
MONO ABS: 0.2 10*3/uL (ref 0.1–1.0)
MONOS PCT: 2 % — AB (ref 3–12)
Neutro Abs: 5.6 10*3/uL (ref 1.7–7.7)
Neutrophils Relative %: 79 % — ABNORMAL HIGH (ref 43–77)
Platelets: 121 10*3/uL — ABNORMAL LOW (ref 150–400)
RBC: 3.66 MIL/uL — AB (ref 3.87–5.11)
RDW: 15.3 % (ref 11.5–15.5)
WBC: 7.2 10*3/uL (ref 4.0–10.5)

## 2013-10-17 LAB — MAGNESIUM: Magnesium: 1.9 mg/dL (ref 1.5–2.5)

## 2013-10-17 LAB — HEMOGLOBIN AND HEMATOCRIT, BLOOD
HCT: 32.4 % — ABNORMAL LOW (ref 36.0–46.0)
HEMATOCRIT: 32.9 % — AB (ref 36.0–46.0)
Hemoglobin: 10.6 g/dL — ABNORMAL LOW (ref 12.0–15.0)
Hemoglobin: 10.6 g/dL — ABNORMAL LOW (ref 12.0–15.0)

## 2013-10-17 MED ORDER — PANTOPRAZOLE SODIUM 40 MG PO TBEC: 40.0000 mg | DELAYED_RELEASE_TABLET | Freq: Two times a day (BID) | ORAL | Status: AC

## 2013-10-17 MED ORDER — TBO-FILGRASTIM 300 MCG/0.5ML ~~LOC~~ SOSY
300.0000 ug | PREFILLED_SYRINGE | Freq: Every day | SUBCUTANEOUS | Status: DC
  Administered 2013-10-17: 300 ug via SUBCUTANEOUS
  Filled 2013-10-17: qty 0.5

## 2013-10-17 MED ORDER — HYDROCORTISONE 2.5 % RE CREA: TOPICAL_CREAM | Freq: Three times a day (TID) | RECTAL | Status: DC

## 2013-10-17 NOTE — Evaluation (Signed)
Physical Therapy Evaluation Patient Details Name: [name] MRN: [id number] DOB: [birth year] Today's Date: 10/17/2013   History of Present Illness  Admitted today with hematochezia.  Had a diagnositic colonoscopy and found prolapsed external hemorrhoids.  Conservative treatment to be completed at home.  Clinical Impression  Pt/Daughter feel that if she had enough energy and strength to walk/move around with the therapist, they will be able to handle things at home.  They do not want Home PT at this time.  All questions answered.  No further Acute needs.  Sign off at this time.    Follow Up Recommendations No PT follow up;Other (comment) (Daughter says she can do it, pt will say NO to PT)    Equipment Recommendations  None recommended by PT    Recommendations for Other Services       Precautions / Restrictions Precautions Precautions: Fall Restrictions Weight Bearing Restrictions: No      Mobility  Bed Mobility                  Transfers Overall transfer level: Needs assistance   Transfers: Sit to/from Stand Sit to Stand: Min guard         General transfer comment: guard for safety  Ambulation/Gait Ambulation/Gait assistance: Min assist Ambulation Distance (Feet): 65 Feet Assistive device: 1 person hand held assist Gait Pattern/deviations: Step-through pattern;Decreased step length - right;Decreased step length - left;Decreased stride length        Stairs            Wheelchair Mobility    Modified Rankin (Stroke Patients Only)       Balance Overall balance assessment: Needs assistance Sitting-balance support: Feet supported;No upper extremity supported Sitting balance-Leahy Scale: Good     Standing balance support: During functional activity;No upper extremity supported Standing balance-Leahy Scale: Fair                               Pertinent Vitals/Pain Pt maintained sats at 90 or above during gait on 2 L.      Home Living Family/patient expects to be discharged to:: Private residence Living Arrangements: Children Available Help at Discharge: Family;Available 24 hours/day Type of Home: Mobile home Home Access: Stairs to enter Entrance Stairs-Rails: Right;Left Entrance Stairs-Number of Steps: 5 Home Layout: One level Home Equipment: Walker - 2 wheels;Cane - single point;Bedside commode;Hospital bed (toilet riser)      Prior Function Level of Independence: Independent with assistive device(s)         Comments: can get up to the bathroom by herself with cane.  Dtr helps with any needs during ADL's     Hand Dominance        Extremity/Trunk Assessment   Upper Extremity Assessment: Overall WFL for tasks assessed;Generalized weakness           Lower Extremity Assessment: Overall WFL for tasks assessed (grossly 4/5 overall)         Communication   Communication: No difficulties  Cognition Arousal/Alertness: Awake/alert Behavior During Therapy: WFL for tasks assessed/performed Overall Cognitive Status: Within Functional Limits for tasks assessed                      General Comments      Exercises        Assessment/Plan    PT Assessment Patent does not need any further PT services  PT Diagnosis     PT Problem List  PT Treatment Interventions     PT Goals (Current goals can be found in the Care Plan section) Acute Rehab PT Goals PT Goal Formulation: No goals set, d/c therapy    Frequency     Barriers to discharge        Co-evaluation               End of Session   Activity Tolerance: Patient tolerated treatment well Patient left: Other (comment);with family/visitor present (sitting EOB) Nurse Communication: Mobility status         Time: 4276-7011 PT Time Calculation (min): 29 min   Charges:   PT Evaluation $Initial PT Evaluation Tier I: 1 Procedure PT Treatments $Gait Training: 8-22 mins   PT G CodesTessie Fass Xandria Gallaga 10/17/2013, 10:39 AM 10/17/2013  Donnella Sham, [place] [phone]  (pager)

## 2013-10-17 NOTE — Discharge Summary (Signed)
Physician Discharge Summary  [name] QQP:[id number] DOB: Feb 10, 1931 DOA: 10/14/2013  PCP: [name], NP  Admit date: 10/14/2013 Discharge date: 10/17/2013  Time spent: 40 minutes  Recommendations for Outpatient Follow-up:   GI bleed/grade 3 prolapsed hemorrhoids  -5/8 Dr. Carol Ada (GI) , performed diagnostic colonoscopy  -5/9 continue Protonix 40 mg PO BID  -Sitz bath QID;  -Anusol 2.5% rectal cream TID  -PT; recommends no followup  -Followup with PCP -Followup withDr. Carol Ada (GI) PRN for recurrent bleeding  Paroxysmal A. Fib  -Currently in sinus tachycardia  -Continue metoprolol 25 mg BID    Diastolic CHF  - Continue hold Lasix 40 mg daily  -Followup with PCP; PCP to restart at appropriate time  HTN  -Hold Norvasc 5 mg  -Hold enalapril 10 mg  -Followup with PCP; patient's PCP will restart as appropriate  HLD  -Continue Zocor 80 mg daily   CKD  -Patient at baseline creatinine. Baseline 1.5-2.2  - Resolved    COPD  -Spiriva daily  -Discharge on home O2 2 L 24 hours a day; this patient's chronic home O2 dose.  Clotting disorder/DVT/PE  -Continue to hold chronic anticoagulation secondary to GI bleed  -PCP to restart anticoagulation in approximately 2 weeks  Lung cancer with metastases to liver/pancreas  -Patient has appointment with Dr.Gustav C Magrinat (oncology) on Monday@1000      Discharge Diagnoses:  Principal Problem:   Prolapsed internal hemorrhoids, grade 3 Active Problems:   Hx pulmonary embolism   CHF (congestive heart failure)   Hypertension   GI bleed   Clotting disorder   Paroxysmal atrial fibrillation   Hypotension   Metastatic lung cancer (metastasis from lung to other site)   GIB (gastrointestinal bleeding)   Discharge Condition: Stable  Diet recommendation: Heart healthy  Filed Weights   10/15/13 0013 10/16/13 1321  Weight: 98.2 kg (216 lb 7.9 oz) 97.977 kg (216 lb)    History of present illness:  80 yo WF PMHx  PAF, diastolic CHF by echocardiogram 2012, HTN, Clotting DO, High-grade neuroendocrine lung cancer, 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 comes in with several episodes today of large brbpr. No abd pain. No rectal pain. No n/v. No fevers. She has had several more episodes while in the ED waiting. Again denies pain.  5/7 states attending chemotherapy on Monday/Tuesday, however on Wednesday was too weak to attend her scheduled chemotherapy.  5/8 S/P Diagnostic colonoscopy 10/16/2013. Showed grade 3 prolapsed hemorrhoids as source of bleeding; see results below. Dr.Gustav C Magrinat (oncology) saw patient today and explained to patient that she will have an appointment on 5/11 at [facility] [facility]. Patient resting comfortably in bed in mild abdominal pain consistent with colonoscopy.   Hospital Course:  5/9 patient is much comfortable on home O2 regimen  Consultants:  Dr. Carol Ada (GI)    Procedure/Significant Events:  CT abdomen pelvis without contrast 10/14/2013  - New hepatomegaly and diffuse heterogeneous attenuation, most compatible diffuse hepatic metastatic disease from pancreaticcancer.  -Interval enlargement of pancreatic head mass, now with central low attenuation suggesting central necrosis.  Diagnostic colonoscopy 10/16/2013  -Grade 3 prolapased hemorrhoids. Source of bleeding.  -Nonbleeding AVMs.  -A couple of small benign polyps.    Culture  10/15/2013 MRSA by PCR negative    Antibiotics:  NA    Discharge Exam: Filed Vitals:   10/17/13 0851 10/17/13 0935 10/17/13 1227 10/17/13 1510  BP:  120/43 106/44 99/46  Pulse:  79 79 78  Temp:  98.5 F (36.9 C) 98.6 F (37 C)   TempSrc:  Oral Oral   Resp:  18 20 15   Height:      Weight:      SpO2: 93% 94% 93% 92%   General: A./O. x4, NAD  Lungs: Clear to auscultation bilaterally without wheezes or crackles  Cardiovascular: Regular rate and  rhythm without murmur gallop or rub normal S1 and S2  Abdomen: Nontender, nondistended, soft, bowel sounds positive, no rebound, no ascites, no appreciable mass  Extremities: No significant cyanosis, clubbing, or edema bilateral lower extremities  Discharge Instructions       Future Appointments Provider Department Dept Phone   10/19/2013 10:15 AM [facility] [place] [phone]   10/19/2013 10:30 AM Chauncey Cruel, MD [facility] [phone]   11/03/2013 8:45 AM [facility] Only [facility] [phone]   11/03/2013 9:15 AM Amy Fredderick [facility] [phone]   11/03/2013 11:30 AM [facility] Procedure [facility] [phone]   11/04/2013 2:30 PM [facility] C8 [facility] [phone]   11/05/2013 2:00 PM [facility] E14 [facility] [phone]   11/06/2013 10:45 AM [facility] Inj Nurse [facility] [phone]   11/11/2013 12:30 PM [facility] 2 [facility] [phone]   Liquids only 4 hours prior to your exam. Any medications can be taken as usual. Please arrive 15 min prior to your scheduled exam time.   11/11/2013 1:00 PM [facility]-Ct 2 [facility] [phone]   Please pick up your oral contrast prep at your scheduled location at least 1 day prior to your appointment. Liquids only 4 hours prior to your exam. Any medications can be taken as usual. Please arrive 15 min prior to your scheduled exam time.   11/13/2013 10:30 AM [facility] [phone]   11/13/2013 11:00 AM Chauncey Cruel, MD [facility] [phone]       Medication List    STOP taking these medications       amLODipine 5 MG tablet   Commonly known as:  NORVASC     benazepril 10 MG tablet  Commonly known as:  LOTENSIN     furosemide 40 MG tablet  Commonly known as:  LASIX     warfarin 3 MG tablet  Commonly known as:  COUMADIN      TAKE these medications       acetaminophen 650 MG CR tablet  Commonly known as:  TYLENOL  Take 650 mg by mouth every 8 (eight) hours as needed. For pain     alendronate 70 MG tablet  Commonly known as:  FOSAMAX  Take 70 mg by mouth every 7 (seven) days. Take with a full glass of water on an empty stomach. On Tuesdays     B-12 PO  Take 1 tablet by mouth daily.     cimetidine 200 MG tablet  Commonly known as:  TAGAMET  Take 200 mg by mouth daily.     diphenhydramine-acetaminophen 25-500 MG Tabs  Commonly known as:  TYLENOL PM  Take 1-2 tablets by mouth at bedtime as needed (pain/sleep). For sleeplessness     fenofibrate 145 MG tablet  Commonly known as:  TRICOR  Take 145 mg by mouth  every morning.     Garlic 2 MG Caps  Take 1 capsule by mouth daily.     GLUCOSAMINE CHONDR COMPLEX PO  Take 1 tablet by mouth daily.     hydrocortisone 2.5 % rectal cream  Commonly known as:  ANUSOL-HC  Place rectally 3 (three) times daily.     lidocaine-prilocaine cream  Commonly known as:  EMLA  Apply 1 application topically as needed (for chemo).     lidocaine-prilocaine cream  Commonly known as:  EMLA  Apply 1 application topically as needed.     methocarbamol 500 MG tablet  Commonly known as:  ROBAXIN  Take 500 mg by mouth every 8 (eight) hours as needed. For muscle spasms     metoprolol tartrate 25 MG tablet  Commonly known as:  LOPRESSOR  Take 25 mg by mouth 2 (two) times daily.     OMEGA-3 FISH OIL PO  Take 1 capsule by mouth daily.     ondansetron 8 MG tablet  Commonly known as:  ZOFRAN  Take 1 tablet (8 mg total) by mouth 2 (two) times daily. Start the day after chemo for 3 days. Then take as needed for nausea or vomiting.     oxyCODONE-acetaminophen 5-325 MG  per tablet  Commonly known as:  PERCOCET/ROXICET  Take 1 tablet by mouth every 4 (four) hours as needed. For pain     pantoprazole 40 MG tablet  Commonly known as:  PROTONIX  Take 40 mg by mouth daily.     pantoprazole 40 MG tablet  Commonly known as:  PROTONIX  Take 1 tablet (40 mg total) by mouth 2 (two) times daily.     PARAPLATIN IV  Inject 280 mcg into the vein daily.     prochlorperazine 5 MG tablet  Commonly known as:  COMPAZINE  Take 1 tablet (5 mg total) by mouth 4 (four) times daily -  before meals and at bedtime.     rOPINIRole 0.25 MG tablet  Commonly known as:  REQUIP  Take 0.25 mg by mouth at bedtime.     sennosides-docusate sodium 8.6-50 MG tablet  Commonly known as:  SENOKOT-S  Take 2 tablets by mouth daily. For constipation     simvastatin 80 MG tablet  Commonly known as:  ZOCOR  Take 80 mg by mouth at bedtime.     tiotropium 18 MCG inhalation capsule  Commonly known as:  SPIRIVA  Place 18 mcg into inhaler and inhale daily.     VEPESID IV  Inject 170 mg into the vein daily.     vitamin C 500 MG tablet  Commonly known as:  ASCORBIC ACID  Take 500 mg by mouth daily.     Vitamin D2 400 UNITS Tabs  Take 40 Units by mouth daily.       Allergies  Allergen Reactions  . Aspirin Other (See Comments)    Stomach pain      The results of significant diagnostics from this hospitalization (including imaging, microbiology, ancillary and laboratory) are listed below for reference.    Significant Diagnostic Studies: Ct Abdomen Pelvis Wo Contrast  10/14/2013   CLINICAL DATA:  Bleeding from the rectum.  EXAM: CT ABDOMEN AND PELVIS WITHOUT CONTRAST  TECHNIQUE: Multidetector CT imaging of the abdomen and pelvis was performed following the standard protocol without IV contrast.  COMPARISON:  US BIOPSY dated 08/28/2013; MR ABDOMEN W/O CM dated 07/24/2013; NM PET IMAGE RESTAG (PS) SKULL BASE TO THIGH dated 07/03/2013; CT ABD/PELVIS W CM dated  12/30/2012  FINDINGS:  Bones: Postoperative changes from L5 through S1. No evidence of skeletal metastases. No aggressive osseous lesions identified.  Lung Bases: Atelectasis and probable scarring in the left lower lobe and lingula. Coronary artery atherosclerosis and aortic atherosclerosis. Extensive calcification of the tracheobronchial tree and old granulomatous calcifications.  Liver: Unenhanced CT was performed per clinician order. Lack of IV contrast limits sensitivity and specificity, especially for evaluation of abdominal/pelvic solid viscera.  Hepatomegaly is present which is new compared to the prior MRI of the abdomen and there is a diffuse nodular contour of the liver, due to diffuse metastatic disease. On prior MRI, the liver span was under 18 cm. On today's examination, the liver span is about 24 cm. Cirrhosis developing in this time interval since the MRI is unlikely.  Spleen:  Normal.  Gallbladder:  Contracted.  Common bile duct:  Grossly normal.  Pancreas: Larger mass in the head of the pancreas, with central low attenuation compatible with central necrosis. Tail the pancreas appears within normal limits. No definite ductal obstruction or evidence pancreatitis.  Adrenal glands: Stable nodule in the left adrenal gland. Right adrenal normal.  Kidneys: Right inferior pole exophytic lesions, most compatible with cysts. Vascular calcifications in the renal hila bilaterally. The ureters appear within normal limits.  Stomach: Mild distention of the stomach with oral contrast. No inflammatory changes.  Small bowel: Duodenum and small bowel grossly appears within normal limits. Pancreatic head mass extends up to the second part of the duodenum.  Colon: No inflammatory changes of colon. The distal colon is decompressed. No colonic mass lesion is identified.  Pelvic Genitourinary: Urinary bladder appears normal. Hysterectomy. No free fluid. Ovaries appear within normal limits.  Vasculature: Atherosclerosis without gross acute  vascular abnormality. 27 mm juxta renal abdominal aortic aneurysm. Distal infrarenal abdominal aortic stenosis with dense calcification. Portal vein grossly appears normal for noncontrast CT.  Body Wall: Normal.  IMPRESSION: 1. New hepatomegaly and diffuse heterogeneous attenuation, most compatible with diffuse hepatic metastatic disease from pancreatic cancer. 2. Interval enlargement of pancreatic head mass, now with central low attenuation suggesting central necrosis. 3. Juxtarenal abdominal aortic aneurysm measuring 27 mm. Ectatic abdominal aorta at risk for aneurysm development. Recommend follow up by [place] in 5 years. This recommendation follows ACR consensus guidelines: White Paper of the ACR Incidental Findings Committee II on Vascular Findings. J Am Coll Radiol 2013; 10:789-794. Inferior stretched infrarenal abdominal aortic stenosis. 4. No colonic mass is identified in this patient with rectal bleeding. The distal colon is decompressed, probably secondary to the cathartic affects of GI bleed.   Electronically Signed   By: Dereck Ligas M.D.   On: 10/14/2013 20:07   Ir Fluoro Guide Cv Line Right  10/09/2013   CLINICAL DATA:  Lung carcinoma and need for porta cath to begin chemotherapy.  EXAM: IMPLANTED PORT A CATH PLACEMENT WITH ULTRASOUND AND FLUOROSCOPIC GUIDANCE  ANESTHESIA/SEDATION: 2.0 Mg IV Versed; 50 mcg IV Fentanyl  Total Moderate Sedation Time:  30 minutes  Additional Medications: 2 g IV Ancef. As antibiotic prophylaxis, Ancef was ordered pre-procedure and administered intravenously within one hour of incision.  FLUOROSCOPY TIME:  30 seconds.  PROCEDURE: The procedure, risks, benefits, and alternatives were explained to the patient. Questions regarding the procedure were encouraged and answered. The patient understands and consents to the procedure.  The right neck and chest were prepped with chlorhexidine in a sterile fashion, and a sterile drape was applied covering the operative field. Maximum  barrier sterile technique with sterile  gowns and gloves were used for the procedure. Local anesthesia was provided with 1% lidocaine. Ultrasound was used to confirm patency of the right internal jugular vein.  After creating a small venotomy incision, a 21 gauge needle was advanced into the right internal jugular vein under direct, real-time ultrasound guidance. Ultrasound image documentation was performed. After securing guidewire access, an 8 Fr dilator was placed. A J-wire was kinked to measure appropriate catheter length.  A subcutaneous port pocket was then created along the upper chest wall utilizing sharp and blunt dissection. Portable cautery was utilized. The pocket was irrigated with sterile saline.  A single lumen power injectable port was chosen for placement. The 8 Fr catheter was tunneled from the port pocket site to the venotomy incision. The port was placed in the pocket. External catheter was trimmed to appropriate length based on guidewire measurement.  At the venotomy, an 8 Fr peel-away sheath was placed over a guidewire. The catheter was then placed through the sheath and the sheath removed. Final catheter positioning was confirmed and documented with a fluoroscopic spot image. The port was accessed with a needle and aspirated and flushed with heparinized saline. The needle was removed.  The venotomy and port pocket incisions were closed with subcutaneous 3-0 Monocryl and subcuticular 4-0 Vicryl. Dermabond was applied to both incisions.  Complications: None. No pneumothorax.  FINDINGS: After catheter placement, the tip lies at the cavoatrial junction. The catheter aspirates normally and is ready for immediate use.  IMPRESSION: Placement of single lumen port a cath via right internal jugular vein. The catheter tip lies at the cavoatrial junction. A power injectable port a cath was placed and is ready for immediate use.   Electronically Signed   By: Aletta Edouard M.D.   On: 10/09/2013 14:07    Ir US Guide Vasc Access Right  10/09/2013   CLINICAL DATA:  Lung carcinoma and need for porta cath to begin chemotherapy.  EXAM: IMPLANTED PORT A CATH PLACEMENT WITH ULTRASOUND AND FLUOROSCOPIC GUIDANCE  ANESTHESIA/SEDATION: 2.0 Mg IV Versed; 50 mcg IV Fentanyl  Total Moderate Sedation Time:  30 minutes  Additional Medications: 2 g IV Ancef. As antibiotic prophylaxis, Ancef was ordered pre-procedure and administered intravenously within one hour of incision.  FLUOROSCOPY TIME:  30 seconds.  PROCEDURE: The procedure, risks, benefits, and alternatives were explained to the patient. Questions regarding the procedure were encouraged and answered. The patient understands and consents to the procedure.  The right neck and chest were prepped with chlorhexidine in a sterile fashion, and a sterile drape was applied covering the operative field. Maximum barrier sterile technique with sterile gowns and gloves were used for the procedure. Local anesthesia was provided with 1% lidocaine. Ultrasound was used to confirm patency of the right internal jugular vein.  After creating a small venotomy incision, a 21 gauge needle was advanced into the right internal jugular vein under direct, real-time ultrasound guidance. Ultrasound image documentation was performed. After securing guidewire access, an 8 Fr dilator was placed. A J-wire was kinked to measure appropriate catheter length.  A subcutaneous port pocket was then created along the upper chest wall utilizing sharp and blunt dissection. Portable cautery was utilized. The pocket was irrigated with sterile saline.  A single lumen power injectable port was chosen for placement. The 8 Fr catheter was tunneled from the port pocket site to the venotomy incision. The port was placed in the pocket. External catheter was trimmed to appropriate length based on guidewire measurement.  At the  venotomy, an 8 Fr peel-away sheath was placed over a guidewire. The catheter was then placed  through the sheath and the sheath removed. Final catheter positioning was confirmed and documented with a fluoroscopic spot image. The port was accessed with a needle and aspirated and flushed with heparinized saline. The needle was removed.  The venotomy and port pocket incisions were closed with subcutaneous 3-0 Monocryl and subcuticular 4-0 Vicryl. Dermabond was applied to both incisions.  Complications: None. No pneumothorax.  FINDINGS: After catheter placement, the tip lies at the cavoatrial junction. The catheter aspirates normally and is ready for immediate use.  IMPRESSION: Placement of single lumen port a cath via right internal jugular vein. The catheter tip lies at the cavoatrial junction. A power injectable port a cath was placed and is ready for immediate use.   Electronically Signed   By: Aletta Edouard M.D.   On: 10/09/2013 14:07    Microbiology: Recent Results (from the past 240 hour(s))  MRSA PCR SCREENING     Status: None   Collection Time    10/15/13  4:07 AM      Result Value Ref Range Status   MRSA by PCR NEGATIVE  NEGATIVE Final   Comment:            The GeneXpert MRSA Assay (FDA     approved for NASAL specimens     only), is one component of a     comprehensive MRSA colonization     surveillance program. It is not     intended to diagnose MRSA     infection nor to guide or     monitor treatment for     MRSA infections.     Labs: Basic Metabolic Panel:  Recent Labs Lab 10/12/13 0812 10/14/13 1519 10/16/13 0148 10/17/13 0355  NA 131* 136* 139 138  K 4.0 4.8 4.7 4.4  CL  --  94* 104 101  CO2 25 24 22 23   GLUCOSE 117 118* 98 106*  BUN 25.7 38* 20 16  CREATININE 1.5* 1.69* 0.88 0.78  CALCIUM 10.0 9.9 9.3 9.5  MG  --   --  2.0 1.9   Liver Function Tests:  Recent Labs Lab 10/12/13 0812 10/14/13 1519 10/16/13 0148 10/17/13 0355  AST 249* 277* 512* 490*  ALT 12 22 18 16   ALKPHOS 46 54 40 36*  BILITOT 0.59 0.4 0.7 0.6  PROT 6.7 7.1 6.5 6.3  ALBUMIN  3.1* 3.3* 2.8* 2.8*   No results found for this basename: LIPASE, AMYLASE,  in the last 168 hours No results found for this basename: AMMONIA,  in the last 168 hours CBC:  Recent Labs Lab 10/12/13 0812 10/14/13 1519  10/16/13 0148 10/16/13 1027 10/16/13 1620 10/17/13 0045 10/17/13 0355 10/17/13 1213  WBC 8.4 10.6*  --  7.2  --   --   --  7.2  --   NEUTROABS 5.9  --   --  5.7  --   --   --  5.6  --   HGB 12.4 12.3  < > 11.1*  11.3* 10.8* 11.8* 10.6* 10.8* 10.6*  HCT 37.4 37.4  < > 34.7*  34.9* 33.2* 37.0 32.9* 33.5* 32.4*  MCV 91.5 91.9  --  91.4  --   --   --  91.5  --   PLT 179 212  --  134*  --   --   --  121*  --   < > = values in this interval not  displayed. Cardiac Enzymes: No results found for this basename: CKTOTAL, CKMB, CKMBINDEX, TROPONINI,  in the last 168 hours BNP: BNP (last 3 results)  Recent Labs  05/18/13 1642  PROBNP 196.5   CBG: No results found for this basename: GLUCAP,  in the last 168 hours     Signed:  Dia Crawford, MD [facility] [phone] pager

## 2013-10-18 LAB — TYPE AND SCREEN
ABO/RH(D): O POS
Antibody Screen: NEGATIVE
UNIT DIVISION: 0
Unit division: 0

## 2013-10-19 ENCOUNTER — Encounter (HOSPITAL_COMMUNITY): Payer: Self-pay | Admitting: Gastroenterology

## 2013-10-19 ENCOUNTER — Ambulatory Visit (HOSPITAL_BASED_OUTPATIENT_CLINIC_OR_DEPARTMENT_OTHER): Payer: Medicare Other | Admitting: Oncology

## 2013-10-19 ENCOUNTER — Other Ambulatory Visit (HOSPITAL_BASED_OUTPATIENT_CLINIC_OR_DEPARTMENT_OTHER): Payer: Medicare Other

## 2013-10-19 VITALS — BP 126/69 | HR 82 | Temp 98.2°F | Resp 18 | Ht 65.0 in | Wt 208.4 lb

## 2013-10-19 DIAGNOSIS — C787 Secondary malignant neoplasm of liver and intrahepatic bile duct: Secondary | ICD-10-CM

## 2013-10-19 DIAGNOSIS — C7A090 Malignant carcinoid tumor of the bronchus and lung: Secondary | ICD-10-CM

## 2013-10-19 DIAGNOSIS — C3490 Malignant neoplasm of unspecified part of unspecified bronchus or lung: Secondary | ICD-10-CM

## 2013-10-19 DIAGNOSIS — K922 Gastrointestinal hemorrhage, unspecified: Secondary | ICD-10-CM

## 2013-10-19 LAB — CBC WITH DIFFERENTIAL/PLATELET
BASO%: 0.3 % (ref 0.0–2.0)
Basophils Absolute: 0 10*3/uL (ref 0.0–0.1)
EOS ABS: 0.1 10*3/uL (ref 0.0–0.5)
EOS%: 2.3 % (ref 0.0–7.0)
HEMATOCRIT: 31.9 % — AB (ref 34.8–46.6)
HGB: 10.4 g/dL — ABNORMAL LOW (ref 11.6–15.9)
LYMPH%: 18.9 % (ref 14.0–49.7)
MCH: 29.4 pg (ref 25.1–34.0)
MCHC: 32.6 g/dL (ref 31.5–36.0)
MCV: 90.1 fL (ref 79.5–101.0)
MONO#: 0.2 10*3/uL (ref 0.1–0.9)
MONO%: 3 % (ref 0.0–14.0)
NEUT%: 75.5 % (ref 38.4–76.8)
NEUTROS ABS: 4.5 10*3/uL (ref 1.5–6.5)
Platelets: 107 10*3/uL — ABNORMAL LOW (ref 145–400)
RBC: 3.54 10*6/uL — ABNORMAL LOW (ref 3.70–5.45)
RDW: 14.8 % — ABNORMAL HIGH (ref 11.2–14.5)
WBC: 6 10*3/uL (ref 3.9–10.3)
lymph#: 1.1 10*3/uL (ref 0.9–3.3)
nRBC: 0 % (ref 0–0)

## 2013-10-19 LAB — COMPREHENSIVE METABOLIC PANEL (CC13)
ALT: 21 U/L (ref 0–55)
AST: 158 U/L — ABNORMAL HIGH (ref 5–34)
Albumin: 3 g/dL — ABNORMAL LOW (ref 3.5–5.0)
Alkaline Phosphatase: 53 U/L (ref 40–150)
Anion Gap: 12 mEq/L — ABNORMAL HIGH (ref 3–11)
BUN: 18.3 mg/dL (ref 7.0–26.0)
CALCIUM: 10.2 mg/dL (ref 8.4–10.4)
CHLORIDE: 102 meq/L (ref 98–109)
CO2: 24 meq/L (ref 22–29)
CREATININE: 1 mg/dL (ref 0.6–1.1)
GLUCOSE: 117 mg/dL (ref 70–140)
Potassium: 4 mEq/L (ref 3.5–5.1)
Sodium: 138 mEq/L (ref 136–145)
TOTAL PROTEIN: 6.5 g/dL (ref 6.4–8.3)
Total Bilirubin: 0.41 mg/dL (ref 0.20–1.20)

## 2013-10-19 NOTE — Progress Notes (Signed)
[name] OFFICE PROGRESS NOTE   PCP: Jo Peace, NP GYN: SU:  OTHER MD: [name]  No chief complaint on file.   Chief complaint: "MY lung cancer is back"  Lung cancer history: From Dr. Rolan Bucco  07/30/2012 note:  "1. 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  2. Squamous cell carcinoma of the right lower lobe T1 resected at the  time of surgery on 08/09/2005. All resected lymph nodes were  negative.  3. Non small cell lung cancer of the right upper lobe detected in March  2012, +NABx 09/21/10, with positive PET scan treated with stereotactic  radiation 5000 cGy in 5 fractions from 10/17/2010 through 10/27/2010.  4. Lingular nodule which appears to be increasing in size. This  lesion may have been present on a CT scan of the chest going back  to 08/30/2010. A PET scan carried out on 03/14/2012 showed no evidence of metastatic disease. Core needle biopsy of the lingular nodule was carried out on 04/01/2012 and showed invasive poorly differentiated carcinoma with predominant small cell carcinoma component and minor squamous cell component. Stains for p63, chromogranin and synaptophysin were diffusely positive. Cytokeratin 5/6 was positive in the squamous cell carcinoma component, but negative in the rest of the tumor. The small cell component comprised about 90%  of the tumor. Slides were reviewed with Dr. Lynnell Chad. The lesion was  treated with stereotactic body radiotherapy (SBRT), 18 Gy per session, for  a total of 3 sessions, administered on 04/22/2012, 04/24/2012, and 04/29/2012, for a total of 54 Gy. The patient started chemotherapy with carboplatin and VP-16 on 05/19/2012 for a planned 4 cycles. Cycle #4 was  completed on 07/31/2012."  More recently the patient was evaluated by Dr.Khan, after the patient presented to the emergency room with a cough and chest pain and found to have several cracked ribs. A CT angiogram found a right lower lobe nodule concerning for recurrence and she was referred back to oncology for further evaluation. PET scan 07/03/2013 showed, aside from 2 right lung nodules which were nonspecific, Jo hypermetabolic activity within the head of the pancreas. MRI of the abdomen obtained 07/24/2013 confirmed a soft tissue mass centered at the ventral aspect of the pancreatic head measuring 3.3 cm, as well as innumerable liver lesions which were Jo as compared to July of 2014. On 08/28/2013 the patient underwent right liver lobe biopsy, which showed (SZ B [phone]) a metastatic poorly differentiated neuroendocrine carcinoma which was positive for CD 56, chromogranin, and synaptophysin. TTF-1 was positive, consistent with a lung primary.  The patient's subsequent history is as detailed below   INTERVAL HISTORY: [name] returns today for followup of her recurrent lung cancer, accompanied by her daughter [name]. Today is day 8 cycle 1 of carboplatin and etoposide. She received the first 2 days without event but on day 3 she ended up in the emergency room with GI bleeding. She was evaluated by Dr. Carol Ada while in the hospital and colonoscopy on 10/16/2013 showed some non-bleeding AVMs, but  large prolapsed internal and external hemorrhoids as the more likely cause of bleeding. The patient did not receive the day 3 treatment or Neulasta, but while in the hospital she received 2 doses of Neupogen. She did not require transfusion, with the lowest hemoglobin  reading during her hospitalization being 10.6.  REVIEW OF SYSTEMS:   Aside from the gastrointestinal bleed problem, she complains of pain in her lower pelvis bilaterally. She had a black bowel movement this morning and a little bit of spotting.  With the chemotherapy she had mild nausea, no vomiting. She feels tired, and she has chronic pain in her lower back and some joints. Of course she has difficulty walking. Her diabetes she says is well-controlled. Her dentures do not fit well. She is short of breath even at rest sometimes. She denies chest pain or pressure, cough, phlegm production, or pleurisy. Her appetite is poor. Aside from these issues a detailed review of systems today was stable  MEDICAL HISTORY: Past Medical History  Diagnosis Date  . Cancer     lung ca, RUL  . CHF (congestive heart failure)   . Hypertension   . Hx pulmonary embolism 03/2011    on coumadin till 02/2011,stopped  due to gi bleed  . GERD (gastroesophageal reflux disease)   . GI bleed   . Colitis     history  . Chest pain 06/30/11    hx blood clot in lung  . Bell's palsy   . PVD (peripheral vascular disease)   . Lung cancer     metachronous stage I nscca RUL  . History of radiation therapy 5/8/10th,14th,16th,and 10/27/2010    RUL lung nscca  . Blood transfusion     during lumbar surgery  . History of chemotherapy 2006-207    carboplatin/VP16 last January 207  . Clotting disorder     DVT/ PE  . Lung cancer 04/01/12    biopsy-lingular nodule-inv carcinoma, minor squamous cell ca  . History of radiation therapy 11/12, 11/14, 04/29/2012    lingular -squamous cell carcinoma, nodule  . Paroxysmal atrial fibrillation   . On continuous oral anticoagulation   . Diabetes mellitus without complication     patient states "diet controlled."   ALLERGIES:  is allergic to aspirin.  MEDICATIONS: has a current medication list which includes the following prescription(s): acetaminophen, alendronate, carboplatin, cimetidine, cyanocobalamin, diphenhydramine-acetaminophen, vitamin d2, etoposide, fenofibrate, garlic, glucosamine-chondroitin, hydrocortisone, lidocaine-prilocaine, lidocaine-prilocaine, methocarbamol, metoprolol tartrate, omega-3 fatty acids,  ondansetron, oxycodone-acetaminophen, pantoprazole, pantoprazole, prochlorperazine, ropinirole, sennosides-docusate sodium, simvastatin, tiotropium, and vitamin c.  SURGICAL HISTORY:  Past Surgical History  Procedure Laterality Date  . Right lower lobe lung resection  2007  . Cataract surgery      b/l  lens implants  . Abdominal hysterectomy      partial age 45  . Shoulder open rotator cuff repair  04/2003    left  . Appendectomy    . Carpal tunnel release    . Bladder surgery    . Lumbar spine surgery      x3, L4-S1  . Right lung biopsy  2006  . Coronary angioplasty with stent placement  2001    renal stent  . Colonoscopies  2011    polyp removal   . 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 She is a widow and lives with her daughter [name], who is herself disabled. A daily as a history of breast cancer dating back to 2005 and apparently developed congestive heart failure. The patient has 2 other children, her son [name] who lives in  [place] and is disabled secondary to back pain and [name] and "has an anger problem". The patient has 2 biological and 2 [name] the grandchildren and 4 great-grandchildren. She is a Psychologist, forensic.  ADVANCED DIRECTIVES: On her 10/02/2013 visit the patient was given healthcare power of attorney documents which she completed and notarized. She has named her daughter [name] as her healthcare power of attorney. Ideally I can be reached at the patient's home phone. At that same visit we also discussed end-of-life issues. The patient is very optimistic and wishes to have any treatment that may prolong her life or make her life better. She agrees that in case of a terminal event she should not be "placed on machines or  life support". Her daughter was present during this discussion.  PHYSICAL EXAMINATION: Elderly white woman examined in a wheelchair BP 126/69  Pulse 82  Temp(Src) 98.2 F (36.8 C) (Oral)  Resp 18  Ht 5\' 5"  (1.651 m)  Wt 208 lb 6.4 oz (94.53 kg)  BMI 34.68 kg/m2  ECOG PERFORMANCE STATUS: 2 - Symptomatic, <50% confined to bed  Sclerae unicteric, EOMs intact Oropharynx clear and moist-- not using her oxygen today No cervical or supraclavicular adenopathy Lungs no rales or rhonchi, fair excursion bilaterally Heart regular rate and rhythm Abd soft, obese, nontender, no masses palpated MSK no focal spinal tenderness Neuro: nonfocal, positive affect Breasts: Deferred    LABORATORY DATA: Results for orders placed in visit on 10/19/13 (from the past 48 hour(s))  CBC WITH DIFFERENTIAL     Status: Abnormal   Collection Time    10/19/13  9:55 AM      Result Value Ref Range   WBC 6.0  3.9 - 10.3 10e3/uL   NEUT# 4.5  1.5 - 6.5 10e3/uL   HGB 10.4 (*) 11.6 - 15.9 g/dL   HCT 31.9 (*) 34.8 - 46.6 %   Platelets 107 (*) 145 - 400 10e3/uL   MCV 90.1  79.5 - 101.0 fL   MCH 29.4  25.1 - 34.0 pg   MCHC 32.6  31.5 - 36.0 g/dL   RBC 3.54 (*) 3.70 - 5.45 10e6/uL   RDW 14.8 (*) 11.2 - 14.5 %   lymph# 1.1  0.9 - 3.3 10e3/uL   MONO# 0.2  0.1 - 0.9 10e3/uL   Eosinophils Absolute 0.1  0.0 - 0.5 10e3/uL   Basophils Absolute 0.0  0.0 - 0.1 10e3/uL   NEUT% 75.5  38.4 - 76.8 %   LYMPH% 18.9  14.0 - 49.7 %   MONO% 3.0  0.0 - 14.0 %   EOS% 2.3  0.0 - 7.0 %   BASO% 0.3  0.0 - 2.0 %   nRBC 0  0 - 0 %  COMPREHENSIVE METABOLIC PANEL (TF57)     Status: Abnormal   Collection Time    10/19/13  9:55 AM      Result Value Ref Range   Sodium 138  136 - 145 mEq/L   Potassium 4.0  3.5 - 5.1 mEq/L   Chloride 102  98 - 109 mEq/L   CO2 24  22 - 29 mEq/L   Glucose 117  70 - 140 mg/dl   BUN 18.3  7.0 - 26.0 mg/dL   Creatinine 1.0  0.6 - 1.1 mg/dL   Total Bilirubin 0.41  0.20 - 1.20 mg/dL   Alkaline  Phosphatase 53  40 - 150 U/L   AST 158 (*) 5 - 34 U/L   ALT 21  0 -  55 U/L   Total Protein 6.5  6.4 - 8.3 g/dL   Albumin 3.0 (*) 3.5 - 5.0 g/dL   Calcium 10.2  8.4 - 10.4 mg/dL   Anion Gap 12 (*) 3 - 11 mEq/L    RADIOGRAPHIC STUDIES: Ct Abdomen Pelvis Wo Contrast  10/14/2013   CLINICAL DATA:  Bleeding from the rectum.  EXAM: CT ABDOMEN AND PELVIS WITHOUT CONTRAST  TECHNIQUE: Multidetector CT imaging of the abdomen and pelvis was performed following the standard protocol without IV contrast.  COMPARISON:  US BIOPSY dated 08/28/2013; MR ABDOMEN W/O CM dated 07/24/2013; NM PET IMAGE RESTAG (PS) SKULL BASE TO THIGH dated 07/03/2013; CT ABD/PELVIS W CM dated 12/30/2012  FINDINGS: Bones: Postoperative changes from L5 through S1. No evidence of skeletal metastases. No aggressive osseous lesions identified.  Lung Bases: Atelectasis and probable scarring in the left lower lobe and lingula. Coronary artery atherosclerosis and aortic atherosclerosis. Extensive calcification of the tracheobronchial tree and old granulomatous calcifications.  Liver: Unenhanced CT was performed per clinician order. Lack of IV contrast limits sensitivity and specificity, especially for evaluation of abdominal/pelvic solid viscera.  Hepatomegaly is present which is Jo compared to the prior MRI of the abdomen and there is a diffuse nodular contour of the liver, due to diffuse metastatic disease. On prior MRI, the liver span was under 18 cm. On today's examination, the liver span is about 24 cm. Cirrhosis developing in this time interval since the MRI is unlikely.  Spleen:  Normal.  Gallbladder:  Contracted.  Common bile duct:  Grossly normal.  Pancreas: Larger mass in the head of the pancreas, with central low attenuation compatible with central necrosis. Tail the pancreas appears within normal limits. No definite ductal obstruction or evidence pancreatitis.  Adrenal glands: Stable nodule in the left adrenal gland. Right adrenal normal.   Kidneys: Right inferior pole exophytic lesions, most compatible with cysts. Vascular calcifications in the renal hila bilaterally. The ureters appear within normal limits.  Stomach: Mild distention of the stomach with oral contrast. No inflammatory changes.  Small bowel: Duodenum and small bowel grossly appears within normal limits. Pancreatic head mass extends up to the second part of the duodenum.  Colon: No inflammatory changes of colon. The distal colon is decompressed. No colonic mass lesion is identified.  Pelvic Genitourinary: Urinary bladder appears normal. Hysterectomy. No free fluid. Ovaries appear within normal limits.  Vasculature: Atherosclerosis without gross acute vascular abnormality. 27 mm juxta renal abdominal aortic aneurysm. Distal infrarenal abdominal aortic stenosis with dense calcification. Portal vein grossly appears normal for noncontrast CT.  Body Wall: Normal.  IMPRESSION: 1. Jo hepatomegaly and diffuse heterogeneous attenuation, most compatible with diffuse hepatic metastatic disease from pancreatic cancer. 2. Interval enlargement of pancreatic head mass, now with central low attenuation suggesting central necrosis. 3. Juxtarenal abdominal aortic aneurysm measuring 27 mm. Ectatic abdominal aorta at risk for aneurysm development. Recommend follow up by [place] in 5 years. This recommendation follows ACR consensus guidelines: White Paper of the ACR Incidental Findings Committee II on Vascular Findings. J Am Coll Radiol 2013; 10:789-794. Inferior stretched infrarenal abdominal aortic stenosis. 4. No colonic mass is identified in this patient with rectal bleeding. The distal colon is decompressed, probably secondary to the cathartic affects of GI bleed.   Electronically Signed   By: Dereck Ligas M.D.   On: 10/14/2013 20:07   Ir Fluoro Guide Cv Line Right  10/09/2013   CLINICAL DATA:  Lung carcinoma and need for porta cath to begin chemotherapy.  EXAM:  IMPLANTED PORT A CATH PLACEMENT WITH  ULTRASOUND AND FLUOROSCOPIC GUIDANCE  ANESTHESIA/SEDATION: 2.0 Mg IV Versed; 50 mcg IV Fentanyl  Total Moderate Sedation Time:  30 minutes  Additional Medications: 2 g IV Ancef. As antibiotic prophylaxis, Ancef was ordered pre-procedure and administered intravenously within one hour of incision.  FLUOROSCOPY TIME:  30 seconds.  PROCEDURE: The procedure, risks, benefits, and alternatives were explained to the patient. Questions regarding the procedure were encouraged and answered. The patient understands and consents to the procedure.  The right neck and chest were prepped with chlorhexidine in a sterile fashion, and a sterile drape was applied covering the operative field. Maximum barrier sterile technique with sterile gowns and gloves were used for the procedure. Local anesthesia was provided with 1% lidocaine. Ultrasound was used to confirm patency of the right internal jugular vein.  After creating a small venotomy incision, a 21 gauge needle was advanced into the right internal jugular vein under direct, real-time ultrasound guidance. Ultrasound image documentation was performed. After securing guidewire access, an 8 Fr dilator was placed. A J-wire was kinked to measure appropriate catheter length.  A subcutaneous port pocket was then created along the upper chest wall utilizing sharp and blunt dissection. Portable cautery was utilized. The pocket was irrigated with sterile saline.  A single lumen power injectable port was chosen for placement. The 8 Fr catheter was tunneled from the port pocket site to the venotomy incision. The port was placed in the pocket. External catheter was trimmed to appropriate length based on guidewire measurement.  At the venotomy, an 8 Fr peel-away sheath was placed over a guidewire. The catheter was then placed through the sheath and the sheath removed. Final catheter positioning was confirmed and documented with a fluoroscopic spot image. The port was accessed with a needle and  aspirated and flushed with heparinized saline. The needle was removed.  The venotomy and port pocket incisions were closed with subcutaneous 3-0 Monocryl and subcuticular 4-0 Vicryl. Dermabond was applied to both incisions.  Complications: None. No pneumothorax.  FINDINGS: After catheter placement, the tip lies at the cavoatrial junction. The catheter aspirates normally and is ready for immediate use.  IMPRESSION: Placement of single lumen port a cath via right internal jugular vein. The catheter tip lies at the cavoatrial junction. A power injectable port a cath was placed and is ready for immediate use.   Electronically Signed   By: Aletta Edouard M.D.   On: 10/09/2013 14:07   Ir US Guide Vasc Access Right  10/09/2013   CLINICAL DATA:  Lung carcinoma and need for porta cath to begin chemotherapy.  EXAM: IMPLANTED PORT A CATH PLACEMENT WITH ULTRASOUND AND FLUOROSCOPIC GUIDANCE  ANESTHESIA/SEDATION: 2.0 Mg IV Versed; 50 mcg IV Fentanyl  Total Moderate Sedation Time:  30 minutes  Additional Medications: 2 g IV Ancef. As antibiotic prophylaxis, Ancef was ordered pre-procedure and administered intravenously within one hour of incision.  FLUOROSCOPY TIME:  30 seconds.  PROCEDURE: The procedure, risks, benefits, and alternatives were explained to the patient. Questions regarding the procedure were encouraged and answered. The patient understands and consents to the procedure.  The right neck and chest were prepped with chlorhexidine in a sterile fashion, and a sterile drape was applied covering the operative field. Maximum barrier sterile technique with sterile gowns and gloves were used for the procedure. Local anesthesia was provided with 1% lidocaine. Ultrasound was used to confirm patency of the right internal jugular vein.  After creating a small venotomy incision, a  21 gauge needle was advanced into the right internal jugular vein under direct, real-time ultrasound guidance. Ultrasound image documentation was  performed. After securing guidewire access, an 8 Fr dilator was placed. A J-wire was kinked to measure appropriate catheter length.  A subcutaneous port pocket was then created along the upper chest wall utilizing sharp and blunt dissection. Portable cautery was utilized. The pocket was irrigated with sterile saline.  A single lumen power injectable port was chosen for placement. The 8 Fr catheter was tunneled from the port pocket site to the venotomy incision. The port was placed in the pocket. External catheter was trimmed to appropriate length based on guidewire measurement.  At the venotomy, an 8 Fr peel-away sheath was placed over a guidewire. The catheter was then placed through the sheath and the sheath removed. Final catheter positioning was confirmed and documented with a fluoroscopic spot image. The port was accessed with a needle and aspirated and flushed with heparinized saline. The needle was removed.  The venotomy and port pocket incisions were closed with subcutaneous 3-0 Monocryl and subcuticular 4-0 Vicryl. Dermabond was applied to both incisions.  Complications: None. No pneumothorax.  FINDINGS: After catheter placement, the tip lies at the cavoatrial junction. The catheter aspirates normally and is ready for immediate use.  IMPRESSION: Placement of single lumen port a cath via right internal jugular vein. The catheter tip lies at the cavoatrial junction. A power injectable port a cath was placed and is ready for immediate use.   Electronically Signed   By: Aletta Edouard M.D.   On: 10/09/2013 14:07   ASSESSMENT: 81 y.o. [name], [name] woman   (1) with a history of neuroendocrine lung cancer (small cell close(biopsied November of 2006, and treated with carboplatin and VP-16 between November of 2006 and January of 2007, followed by right lower lobe lobectomy March of 2007  (2) squamous cell carcinoma of the right lower lobe, T1 N0, resected at the same time as above  (3)  non-small cell lung cancer of the right upper lobe diagnosed April of 2012, treated with stereotactic radiation (50 gray in 5 fractions) completed may of 2012  (4) metastatic neuroendocrine lung cancer (recurrence from #1 above) diagnosed by liver biopsy 09/05/2013  (a) started carboplatin/etoposide 10/12/2013, day 3 omitted cycle because of unrelated problems  PLAN:  Bobby tolerated her chemotherapy remarkably well. I don't think her GI bleeding problems are related. She is very positive--she tells me she has a red Mustang convertible that she would like to drive again--she is very willing to try the chemotherapy a second time. This has been scheduled for May 26. Hopefully we can get her through all 3 days of treatment and then Neulasta on day 4. She is then scheduled for repeat CT scan to assess for early response.  Melba understands the one thing I do not want to do is make her life worse or shorten her life. I am making no changes in her chemotherapy doses, which were reduced cycle 1. Seng and her daughter have a good understanding of the overall plan. They agree with it. They know the goal of treatment here is control. They'll call with any problems that may develop before her next visit here.      Chauncey Cruel, MD 10/19/2013 10:48 AM

## 2013-10-30 ENCOUNTER — Telehealth: Payer: Self-pay | Admitting: *Deleted

## 2013-10-30 DIAGNOSIS — C3490 Malignant neoplasm of unspecified part of unspecified bronchus or lung: Secondary | ICD-10-CM

## 2013-10-30 MED ORDER — PROCHLORPERAZINE MALEATE 10 MG PO TABS: 10.0000 mg | ORAL_TABLET | Freq: Four times a day (QID) | ORAL | Status: DC | PRN

## 2013-10-30 MED ORDER — ONDANSETRON HCL 8 MG PO TABS: 8.0000 mg | ORAL_TABLET | Freq: Three times a day (TID) | ORAL | Status: DC

## 2013-10-30 NOTE — Telephone Encounter (Signed)
Call received from pt's daughter stating Luvern is having ongoing nausea and is needs other medication.  Pt has zofran prescription but has used it all- insurance will not pay at this time.  Zofran is beneficial for nausea. Pt has had no vomiting.  Per discussion prescription for prochlorperizine will be sent to pharmacy as well as dose change will be placed on zofran which may allow for early refill.  This RN called prescriptions to pharmacy for above medicaitons.  New instructions for zofran given with increased dispense amount.  Prescription accepted and will be filled.  This RN informed pt's daughter of above.

## 2013-11-03 ENCOUNTER — Ambulatory Visit (HOSPITAL_BASED_OUTPATIENT_CLINIC_OR_DEPARTMENT_OTHER): Payer: Medicare Other

## 2013-11-03 ENCOUNTER — Ambulatory Visit (HOSPITAL_BASED_OUTPATIENT_CLINIC_OR_DEPARTMENT_OTHER): Payer: Medicare Other | Admitting: Physician Assistant

## 2013-11-03 ENCOUNTER — Telehealth: Payer: Self-pay | Admitting: *Deleted

## 2013-11-03 ENCOUNTER — Encounter: Payer: Self-pay | Admitting: Physician Assistant

## 2013-11-03 ENCOUNTER — Telehealth: Payer: Self-pay | Admitting: Physician Assistant

## 2013-11-03 ENCOUNTER — Other Ambulatory Visit (HOSPITAL_BASED_OUTPATIENT_CLINIC_OR_DEPARTMENT_OTHER): Payer: Medicare Other

## 2013-11-03 VITALS — BP 117/68 | HR 80 | Temp 97.9°F | Resp 18 | Ht 65.0 in | Wt 204.5 lb

## 2013-11-03 DIAGNOSIS — C3490 Malignant neoplasm of unspecified part of unspecified bronchus or lung: Secondary | ICD-10-CM

## 2013-11-03 DIAGNOSIS — C7A1 Malignant poorly differentiated neuroendocrine tumors: Secondary | ICD-10-CM

## 2013-11-03 DIAGNOSIS — C787 Secondary malignant neoplasm of liver and intrahepatic bile duct: Secondary | ICD-10-CM

## 2013-11-03 DIAGNOSIS — Z5111 Encounter for antineoplastic chemotherapy: Secondary | ICD-10-CM

## 2013-11-03 DIAGNOSIS — I509 Heart failure, unspecified: Secondary | ICD-10-CM

## 2013-11-03 DIAGNOSIS — Z923 Personal history of irradiation: Secondary | ICD-10-CM

## 2013-11-03 DIAGNOSIS — K1379 Other lesions of oral mucosa: Secondary | ICD-10-CM

## 2013-11-03 DIAGNOSIS — C3410 Malignant neoplasm of upper lobe, unspecified bronchus or lung: Secondary | ICD-10-CM

## 2013-11-03 DIAGNOSIS — E119 Type 2 diabetes mellitus without complications: Secondary | ICD-10-CM

## 2013-11-03 DIAGNOSIS — K219 Gastro-esophageal reflux disease without esophagitis: Secondary | ICD-10-CM

## 2013-11-03 DIAGNOSIS — R0602 Shortness of breath: Secondary | ICD-10-CM

## 2013-11-03 LAB — CBC WITH DIFFERENTIAL/PLATELET
BASO%: 0.8 % (ref 0.0–2.0)
Basophils Absolute: 0.1 10*3/uL (ref 0.0–0.1)
EOS%: 0.6 % (ref 0.0–7.0)
Eosinophils Absolute: 0.1 10*3/uL (ref 0.0–0.5)
HEMATOCRIT: 39.6 % (ref 34.8–46.6)
HEMOGLOBIN: 13 g/dL (ref 11.6–15.9)
LYMPH#: 1.8 10*3/uL (ref 0.9–3.3)
LYMPH%: 20.6 % (ref 14.0–49.7)
MCH: 30.2 pg (ref 25.1–34.0)
MCHC: 32.9 g/dL (ref 31.5–36.0)
MCV: 91.7 fL (ref 79.5–101.0)
MONO#: 1.4 10*3/uL — AB (ref 0.1–0.9)
MONO%: 15.6 % — ABNORMAL HIGH (ref 0.0–14.0)
NEUT#: 5.6 10*3/uL (ref 1.5–6.5)
NEUT%: 62.4 % (ref 38.4–76.8)
Platelets: 211 10*3/uL (ref 145–400)
RBC: 4.32 10*6/uL (ref 3.70–5.45)
RDW: 15.9 % — ABNORMAL HIGH (ref 11.2–14.5)
WBC: 8.9 10*3/uL (ref 3.9–10.3)

## 2013-11-03 LAB — COMPREHENSIVE METABOLIC PANEL (CC13)
ALBUMIN: 3.5 g/dL (ref 3.5–5.0)
ALT: 13 U/L (ref 0–55)
ANION GAP: 13 meq/L — AB (ref 3–11)
AST: 122 U/L — AB (ref 5–34)
Alkaline Phosphatase: 48 U/L (ref 40–150)
BUN: 13.2 mg/dL (ref 7.0–26.0)
CALCIUM: 9.9 mg/dL (ref 8.4–10.4)
CHLORIDE: 95 meq/L — AB (ref 98–109)
CO2: 26 meq/L (ref 22–29)
Creatinine: 1.4 mg/dL — ABNORMAL HIGH (ref 0.6–1.1)
Glucose: 122 mg/dl (ref 70–140)
Potassium: 3.6 mEq/L (ref 3.5–5.1)
SODIUM: 134 meq/L — AB (ref 136–145)
TOTAL PROTEIN: 7 g/dL (ref 6.4–8.3)
Total Bilirubin: 0.41 mg/dL (ref 0.20–1.20)

## 2013-11-03 MED ORDER — ONDANSETRON 16 MG/50ML IVPB (CHCC)
16.0000 mg | Freq: Once | INTRAVENOUS | Status: AC
  Administered 2013-11-03: 16 mg via INTRAVENOUS

## 2013-11-03 MED ORDER — MAGIC MOUTHWASH W/LIDOCAINE: 5.0000 mL | Freq: Four times a day (QID) | ORAL | Status: DC | PRN

## 2013-11-03 MED ORDER — SODIUM CHLORIDE 0.9 % IV SOLN
80.0000 mg/m2 | Freq: Once | INTRAVENOUS | Status: AC
  Administered 2013-11-03: 170 mg via INTRAVENOUS
  Filled 2013-11-03: qty 8.5

## 2013-11-03 MED ORDER — SODIUM CHLORIDE 0.9 % IV SOLN
Freq: Once | INTRAVENOUS | Status: AC
  Administered 2013-11-03: 12:00:00 via INTRAVENOUS

## 2013-11-03 MED ORDER — MAGIC MOUTHWASH W/LIDOCAINE
5.0000 mL | Freq: Four times a day (QID) | ORAL | Status: AC | PRN
Start: 2013-11-03 — End: ?

## 2013-11-03 MED ORDER — ONDANSETRON 16 MG/50ML IVPB (CHCC)
INTRAVENOUS | Status: AC
  Filled 2013-11-03: qty 16

## 2013-11-03 MED ORDER — CARBOPLATIN CHEMO INTRADERMAL TEST DOSE 100MCG/0.02ML
100.0000 ug | Freq: Once | INTRADERMAL | Status: AC
  Administered 2013-11-03: 100 ug via INTRADERMAL
  Filled 2013-11-03: qty 0.01

## 2013-11-03 MED ORDER — SODIUM CHLORIDE 0.9 % IJ SOLN
10.0000 mL | INTRAMUSCULAR | Status: DC | PRN
  Administered 2013-11-03: 10 mL
  Filled 2013-11-03: qty 10

## 2013-11-03 MED ORDER — HEPARIN SOD (PORK) LOCK FLUSH 100 UNIT/ML IV SOLN
500.0000 [IU] | Freq: Once | INTRAVENOUS | Status: AC | PRN
  Administered 2013-11-03: 500 [IU]
  Filled 2013-11-03: qty 5

## 2013-11-03 MED ORDER — DEXAMETHASONE SODIUM PHOSPHATE 20 MG/5ML IJ SOLN
20.0000 mg | Freq: Once | INTRAMUSCULAR | Status: AC
  Administered 2013-11-03: 20 mg via INTRAVENOUS

## 2013-11-03 MED ORDER — SODIUM CHLORIDE 0.9 % IV SOLN
260.0000 mg | Freq: Once | INTRAVENOUS | Status: AC
  Administered 2013-11-03: 260 mg via INTRAVENOUS
  Filled 2013-11-03: qty 26

## 2013-11-03 MED ORDER — DEXAMETHASONE SODIUM PHOSPHATE 20 MG/5ML IJ SOLN
INTRAMUSCULAR | Status: AC
  Filled 2013-11-03: qty 5

## 2013-11-03 NOTE — Telephone Encounter (Signed)
Per staff phone call and POF I have schedueld appts.  JMW  

## 2013-11-03 NOTE — Patient Instructions (Signed)
[facility] Discharge Instructions for Patients Receiving Chemotherapy  Today you received the following chemotherapy agents: Carboplatin, Etoposide  To help prevent nausea and vomiting after your treatment, we encourage you to take your nausea medication: Compazine 10 mg every 6 hrs as needed.    If you develop nausea and vomiting that is not controlled by your nausea medication, call the clinic.   BELOW ARE SYMPTOMS THAT SHOULD BE REPORTED IMMEDIATELY:  *FEVER GREATER THAN 100.5 F  *CHILLS WITH OR WITHOUT FEVER  NAUSEA AND VOMITING THAT IS NOT CONTROLLED WITH YOUR NAUSEA MEDICATION  *UNUSUAL SHORTNESS OF BREATH  *UNUSUAL BRUISING OR BLEEDING  TENDERNESS IN MOUTH AND THROAT WITH OR WITHOUT PRESENCE OF ULCERS  *URINARY PROBLEMS  *BOWEL PROBLEMS  UNUSUAL RASH Items with * indicate a potential emergency and should be followed up as soon as possible.  Feel free to call the clinic you have any questions or concerns. The clinic phone number is [phone].

## 2013-11-03 NOTE — Progress Notes (Signed)
[place] OFFICE PROGRESS NOTE   PCP: Jo Peace, NP GYN: SU:  OTHER MD: [name]  Chief Complaint  Patient presents with  . Follow-up  . Lung Cancer     Lung cancer history: From Dr. Rolan [name]  07/30/2012 note:  "1. 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  2. Squamous cell carcinoma of the right lower lobe T1 resected at the  time of surgery on 08/09/2005. All resected lymph nodes were  negative.  3. Non small cell lung cancer of the right upper lobe detected in March  2012, +NABx 09/21/10, with positive PET scan treated with stereotactic  radiation 5000 cGy in 5 fractions from 10/17/2010 through 10/27/2010.  4. Lingular nodule which appears to be increasing in size. This  lesion may have been present on a CT scan of the chest going back  to 08/30/2010. A PET scan carried out on 03/14/2012 showed no evidence of metastatic disease. Core needle biopsy of the lingular nodule was carried out on 04/01/2012 and showed invasive poorly differentiated carcinoma with predominant small cell carcinoma component and minor squamous cell component. Stains for p63, chromogranin and synaptophysin were diffusely positive. Cytokeratin 5/6 was positive in the squamous cell carcinoma component, but negative in the rest of the tumor. The small cell component comprised about 90%  of the tumor. Slides were reviewed with Dr. Lynnell [name]. The lesion was  treated with stereotactic body radiotherapy (SBRT), 18 Gy per session, for  a total of 3 sessions, administered on 04/22/2012, 04/24/2012, and 04/29/2012, for a total of 54 Gy. The patient started chemotherapy with carboplatin and VP-16 on 05/19/2012 for a planned 4 cycles. Cycle #4 was  completed on 07/31/2012."  More recently the patient was evaluated by [name], after the patient presented to the emergency room with a cough and chest pain and found to have several cracked ribs. A CT angiogram found a right lower lobe nodule concerning for recurrence and she was referred back to oncology for further evaluation. PET scan 07/03/2013 showed, aside from 2 right lung nodules which were nonspecific, new hypermetabolic activity within the head of the pancreas. MRI of the abdomen obtained 07/24/2013 confirmed a soft tissue mass centered at the ventral aspect of the pancreatic head measuring 3.3 cm, as well as innumerable liver lesions which were new as compared to July of 2014. On 08/28/2013 the patient underwent right liver lobe biopsy, which showed ([phone]) a metastatic poorly differentiated neuroendocrine carcinoma which was positive for CD 56, chromogranin, and synaptophysin. TTF-1 was positive, consistent with a lung primary.  The patient's subsequent history is as detailed below   INTERVAL HISTORY: [name] returns today accompanied by her daughter [name] for followup of her recurrent lung cancer.  She's currently being treated with carboplatin/etoposide, today being day 1 cycle 2. She receives infusions on days 1, 2, and 3 of each 21 day cycle, with Neulasta given on day 4 for granulocyte support. I will mention that cycle 1 was abbreviated due to 2 unrelated issues including a GI bleed. These issues have resolved, and she is ready to resume treatment today.  [name] has had some ongoing nausea, and was recently prescribed prochlorperazine along with an increased dose of ondansetron. She does get relief from those medications, and has had no  emesis. Her appetite is decreased, but she is keeping herself well hydrated. Her mouth is very sore and tender, especially the gums.  She continues to have shortness of breath which is stable. She also has a cough productive of clear phlegm. She's  had no hemoptysis.  REVIEW OF SYSTEMS:   [name] denies any fevers, chills, or night sweats. She does have some difficulty sleeping at times, and has moderate fatigue. She's had no rashes or skin changes. She bruises easily, but has had no abnormal bleeding. She's having regular bowel movements, with neither diarrhea nor constipation. She does have some heartburn which is stable. She has chronic swelling in her feet and ankles which is stable. She denies any chest pain or palpitations. She has occasional headaches and feels somewhat forgetful. She denies any change in vision and has had no dizziness. She does feel weak and tired, and also has some chronic peripheral neuropathy in the fingers and toes. She is able to dress herself and perform fine motor skills without complication, and does not find that the neuropathy affects her day-to-day activities. She does have quite a bit of back pain and joint pain which causes difficulty walking, and she presents in a wheelchair today accordingly.  A detailed review systems is otherwise stable and noncontributory.   MEDICAL HISTORY: Past Medical History  Diagnosis Date  . Cancer     lung ca, RUL  . CHF (congestive heart failure)   . Hypertension   . Hx pulmonary embolism 03/2011    on coumadin till 02/2011,stopped  due to gi bleed  . GERD (gastroesophageal reflux disease)   . GI bleed   . Colitis     history  . Chest pain 06/30/11    hx blood clot in lung  . Bell's palsy   . PVD (peripheral vascular disease)   . Lung cancer     metachronous stage I nscca RUL  . History of radiation therapy 5/8/10th,14th,16th,and 10/27/2010    RUL lung nscca  . Blood transfusion     during lumbar surgery  . History of chemotherapy 2006-207    carboplatin/VP16 last January 207  . Clotting disorder     DVT/ PE  . Lung cancer 04/01/12    biopsy-lingular nodule-inv carcinoma, minor squamous cell ca  . History of radiation therapy 11/12, 11/14, 04/29/2012     lingular -squamous cell carcinoma, nodule  . Paroxysmal atrial fibrillation   . On continuous oral anticoagulation   . Diabetes mellitus without complication     patient states "diet controlled."   ALLERGIES:  is allergic to aspirin.  MEDICATIONS: has a current medication list which includes the following prescription(s): acetaminophen, alendronate, carboplatin, cimetidine, cyanocobalamin, diphenhydramine-acetaminophen, vitamin d2, etoposide, fenofibrate, garlic, glucosamine-chondroitin, lidocaine-prilocaine, methocarbamol, metoprolol tartrate, omega-3 fatty acids, oxycodone-acetaminophen, pantoprazole, ropinirole, sennosides-docusate sodium, simvastatin, tiotropium, vitamin c, magic mouthwash w/lidocaine, hydrocortisone, ondansetron, and prochlorperazine.  SURGICAL HISTORY:  Past Surgical History  Procedure Laterality Date  . Right lower lobe lung resection  2007  . Cataract surgery      b/l  lens implants  . Abdominal hysterectomy      partial age 16  . Shoulder open rotator cuff repair  04/2003    left  . Appendectomy    . Carpal tunnel release    . Bladder surgery    . Lumbar spine surgery      x3, L4-S1  . Right lung biopsy  2006  . Coronary angioplasty with stent placement  2001    renal stent  .  Colonoscopies  2011    polyp removal   . 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 She is a widow and lives with her daughter [name], who is herself disabled. [name] has a history of breast cancer dating back to 2005 and apparently developed congestive heart failure. The patient has 2 other children, her son [name] who lives in [place] and is disabled secondary to back pain and [name] and "has an anger problem".  The patient has 2 biological and 2 Vicente Serene the grandchildren and 4 great-grandchildren. She is a Psychologist, forensic.  ADVANCED DIRECTIVES: On her 10/02/2013 visit the patient was given healthcare power of attorney documents which she completed and notarized. She has named her daughter [name] as her healthcare power of attorney. Ideally I can be reached at the patient's home phone. At that same visit we also discussed end-of-life issues. The patient is very optimistic and wishes to have any treatment that may prolong her life or make her life better. She agrees that in case of a terminal event she should not be "placed on machines or life support". Her daughter was present during this discussion.  PHYSICAL EXAMINATION: Elderly white woman who appears tired. She was examined in a wheelchair today BP 117/68  Pulse 80  Temp(Src) 97.9 F (36.6 C) (Oral)  Resp 18  Ht 5\' 5"  (1.651 m)  Wt 204 lb 8 oz (92.761 kg)  BMI 34.03 kg/m2  SpO2 96%  ECOG PERFORMANCE STATUS: 2 - Symptomatic, <50% confined to bed  Physical Exam: HEENT:  Sclerae anicteric.  Oropharynx pink and moist. Patient is edentulous, with some erythematous areas along the gums, but no actual ulcerations. No evidence of oropharyngeal candidiasis. Neck is supple, trachea midline.  NODES:  No cervical, supraclavicular, or axillary lymphadenopathy palpated.  BREAST EXAM: Breast exam was deferred. LUNG: Breath sounds are diminished in the right base. Good excursion in the left. No crackles or rhonchi auscultated.  HEART:  Regular rate and rhythm. No murmur  ABDOMEN:  Soft, obese,  nontender.  Positive bowel sounds.  MSK:  No focal spinal tenderness to palpation.  EXTREMITIES:  No peripheral edema.   no clubbing or cyanosis  SKIN:   no visible rashes. No excessive ecchymoses. No petechiae. No pallor.  NEURO:  Nonfocal. Well oriented.  appropriate  affect.     LABORATORY DATA: Results for orders placed in visit on 11/03/13 (from the past 48 hour(s))   CBC WITH DIFFERENTIAL     Status: Abnormal   Collection Time    11/03/13  9:03 AM      Result Value Ref Range   WBC 8.9  3.9 - 10.3 10e3/uL   NEUT# 5.6  1.5 - 6.5 10e3/uL   HGB 13.0  11.6 - 15.9 g/dL   HCT 39.6  34.8 - 46.6 %   Platelets 211  145 - 400 10e3/uL   MCV 91.7  79.5 - 101.0 fL   MCH 30.2  25.1 - 34.0 pg   MCHC 32.9  31.5 - 36.0 g/dL   RBC 4.32  3.70 - 5.45 10e6/uL   RDW 15.9 (*) 11.2 - 14.5 %   lymph# 1.8  0.9 - 3.3 10e3/uL   MONO# 1.4 (*) 0.1 - 0.9 10e3/uL   Eosinophils  Absolute 0.1  0.0 - 0.5 10e3/uL   Basophils Absolute 0.1  0.0 - 0.1 10e3/uL   NEUT% 62.4  38.4 - 76.8 %   LYMPH% 20.6  14.0 - 49.7 %   MONO% 15.6 (*) 0.0 - 14.0 %   EOS% 0.6  0.0 - 7.0 %   BASO% 0.8  0.0 - 2.0 %  COMPREHENSIVE METABOLIC PANEL (BM84)     Status: Abnormal   Collection Time    11/03/13  9:03 AM      Result Value Ref Range   Sodium 134 (*) 136 - 145 mEq/L   Potassium 3.6  3.5 - 5.1 mEq/L   Chloride 95 (*) 98 - 109 mEq/L   CO2 26  22 - 29 mEq/L   Glucose 122  70 - 140 mg/dl   BUN 13.2  7.0 - 26.0 mg/dL   Creatinine 1.4 (*) 0.6 - 1.1 mg/dL   Total Bilirubin 0.41  0.20 - 1.20 mg/dL   Alkaline Phosphatase 48  40 - 150 U/L   AST 122 (*) 5 - 34 U/L   ALT 13  0 - 55 U/L   Total Protein 7.0  6.4 - 8.3 g/dL   Albumin 3.5  3.5 - 5.0 g/dL   Calcium 9.9  8.4 - 10.4 mg/dL   Anion Gap 13 (*) 3 - 11 mEq/L    RADIOGRAPHIC STUDIES: Ct Abdomen Pelvis Wo Contrast 10/14/2013   CLINICAL DATA:  Bleeding from the rectum.  EXAM: CT ABDOMEN AND PELVIS WITHOUT CONTRAST  TECHNIQUE: Multidetector CT imaging of the abdomen and pelvis was performed following the standard protocol without IV contrast.  COMPARISON:  US BIOPSY dated 08/28/2013; MR ABDOMEN W/O CM dated 07/24/2013; NM PET IMAGE RESTAG (PS) SKULL BASE TO THIGH dated 07/03/2013; CT ABD/PELVIS W CM dated 12/30/2012  FINDINGS: Bones: Postoperative changes from L5 through S1. No evidence of skeletal metastases. No aggressive osseous lesions identified.  Lung  Bases: Atelectasis and probable scarring in the left lower lobe and lingula. Coronary artery atherosclerosis and aortic atherosclerosis. Extensive calcification of the tracheobronchial tree and old granulomatous calcifications.  Liver: Unenhanced CT was performed per clinician order. Lack of IV contrast limits sensitivity and specificity, especially for evaluation of abdominal/pelvic solid viscera.  Hepatomegaly is present which is new compared to the prior MRI of the abdomen and there is a diffuse nodular contour of the liver, due to diffuse metastatic disease. On prior MRI, the liver span was under 18 cm. On today's examination, the liver span is about 24 cm. Cirrhosis developing in this time interval since the MRI is unlikely.  Spleen:  Normal.  Gallbladder:  Contracted.  Common bile duct:  Grossly normal.  Pancreas: Larger mass in the head of the pancreas, with central low attenuation compatible with central necrosis. Tail the pancreas appears within normal limits. No definite ductal obstruction or evidence pancreatitis.  Adrenal glands: Stable nodule in the left adrenal gland. Right adrenal normal.  Kidneys: Right inferior pole exophytic lesions, most compatible with cysts. Vascular calcifications in the renal hila bilaterally. The ureters appear within normal limits.  Stomach: Mild distention of the stomach with oral contrast. No inflammatory changes.  Small bowel: Duodenum and small bowel grossly appears within normal limits. Pancreatic head mass extends up to the second part of the duodenum.  Colon: No inflammatory changes of colon. The distal colon is decompressed. No colonic mass lesion is identified.  Pelvic Genitourinary: Urinary bladder appears normal. Hysterectomy. No free fluid. Ovaries appear within normal limits.  Vasculature: Atherosclerosis without gross acute vascular abnormality. 27 mm juxta renal abdominal aortic aneurysm. Distal infrarenal abdominal aortic stenosis with dense calcification.  Portal vein grossly appears normal for noncontrast CT.  Body Wall: Normal.  IMPRESSION: 1. New hepatomegaly and diffuse heterogeneous attenuation, most compatible with diffuse hepatic metastatic disease from pancreatic cancer. 2. Interval enlargement of pancreatic head mass, now with central low attenuation suggesting central necrosis. 3. Juxtarenal abdominal aortic aneurysm measuring 27 mm. Ectatic abdominal aorta at risk for aneurysm development. Recommend follow up by [place] in 5 years. This recommendation follows ACR consensus guidelines: White Paper of the ACR Incidental Findings Committee II on Vascular Findings. J Am Coll Radiol 2013; 10:789-794. Inferior stretched infrarenal abdominal aortic stenosis. 4. No colonic mass is identified in this patient with rectal bleeding. The distal colon is decompressed, probably secondary to the cathartic affects of GI bleed.   Electronically Signed   By: Dereck Ligas M.D.   On: 10/14/2013 20:07     ASSESSMENT: 80 y.o. [place], [place] woman   (1) with a history of neuroendocrine lung cancer (small cell close(biopsied November of 2006, and treated with carboplatin and VP-16 between November of 2006 and January of 2007, followed by right lower lobe lobectomy March of 2007  (2) squamous cell carcinoma of the right lower lobe, T1 N0, resected at the same time as above  (3) non-small cell lung cancer of the right upper lobe diagnosed April of 2012, treated with stereotactic radiation (50 gray in 5 fractions) completed may of 2012  (4) metastatic neuroendocrine lung cancer (recurrence from #1 above) diagnosed by liver biopsy 09/05/2013  (a) started carboplatin/etoposide 10/12/2013, day 3 omitted cycle because of unrelated problems  (b)  treatment with carboplatin/etoposide resumed on 11/03/2013   PLAN:  Ernesta  will proceed to treatment today as scheduled to initiate day 1 cycle 2 of carboplatin/etoposide. She will be treated today, tomorrow, and  Thursday, and will return on Friday (day 4) for her Neulasta injection. She is already scheduled for CTs next week to assess response to therapy, and we'll see Dr. Jana Hakim on June 5 to review those results and discuss her treatment plan.   I will go ahead and schedule her for day 1 cycle 3 which would be due on June 15. She will have labs and a physical exam the same day.   She'll continue with her current antinausea medications which do seem to be effective. She will let us know, however, if she has increased nausea, or certainly if she has emesis. I encouraged her to keep herself well hydrated and to eat when possible. I also suggested nutritional supplements such as Glucerna.   Both Lilianah and her daughter voice understanding and agreement with the above plan. They will call any changes or problems prior to her next scheduled appointment.    Theotis Burrow, PA-C 11/03/2013 11:07 AM

## 2013-11-04 ENCOUNTER — Ambulatory Visit (HOSPITAL_BASED_OUTPATIENT_CLINIC_OR_DEPARTMENT_OTHER): Payer: Medicare Other

## 2013-11-04 ENCOUNTER — Other Ambulatory Visit: Payer: Self-pay

## 2013-11-04 VITALS — BP 121/46 | HR 100 | Temp 98.4°F

## 2013-11-04 DIAGNOSIS — C787 Secondary malignant neoplasm of liver and intrahepatic bile duct: Secondary | ICD-10-CM

## 2013-11-04 DIAGNOSIS — C7A090 Malignant carcinoid tumor of the bronchus and lung: Secondary | ICD-10-CM

## 2013-11-04 DIAGNOSIS — Z5111 Encounter for antineoplastic chemotherapy: Secondary | ICD-10-CM

## 2013-11-04 DIAGNOSIS — C3490 Malignant neoplasm of unspecified part of unspecified bronchus or lung: Secondary | ICD-10-CM

## 2013-11-04 MED ORDER — SODIUM CHLORIDE 0.9 % IJ SOLN
10.0000 mL | INTRAMUSCULAR | Status: DC | PRN
  Administered 2013-11-04: 10 mL
  Filled 2013-11-04: qty 10

## 2013-11-04 MED ORDER — HEPARIN SOD (PORK) LOCK FLUSH 100 UNIT/ML IV SOLN
500.0000 [IU] | Freq: Once | INTRAVENOUS | Status: AC | PRN
  Administered 2013-11-04: 500 [IU]
  Filled 2013-11-04: qty 5

## 2013-11-04 MED ORDER — SODIUM CHLORIDE 0.9 % IV SOLN
80.0000 mg/m2 | Freq: Once | INTRAVENOUS | Status: AC
  Administered 2013-11-04: 170 mg via INTRAVENOUS
  Filled 2013-11-04: qty 8.5

## 2013-11-04 MED ORDER — PROCHLORPERAZINE MALEATE 10 MG PO TABS
ORAL_TABLET | ORAL | Status: AC
  Filled 2013-11-04: qty 1

## 2013-11-04 MED ORDER — SODIUM CHLORIDE 0.9 % IV SOLN
Freq: Once | INTRAVENOUS | Status: AC
  Administered 2013-11-04: 15:00:00 via INTRAVENOUS

## 2013-11-04 MED ORDER — PROCHLORPERAZINE MALEATE 10 MG PO TABS
10.0000 mg | ORAL_TABLET | Freq: Once | ORAL | Status: AC
  Administered 2013-11-04: 10 mg via ORAL

## 2013-11-04 NOTE — Patient Instructions (Signed)
[facility] Discharge Instructions for Patients Receiving Chemotherapy  Today you received the following chemotherapy agents Etoposide (VP 16) To help prevent nausea and vomiting after your treatment, we encourage you to take your nausea medication as prescribed.  If you develop nausea and vomiting that is not controlled by your nausea medication, call the clinic.   BELOW ARE SYMPTOMS THAT SHOULD BE REPORTED IMMEDIATELY:  *FEVER GREATER THAN 100.5 F  *CHILLS WITH OR WITHOUT FEVER  NAUSEA AND VOMITING THAT IS NOT CONTROLLED WITH YOUR NAUSEA MEDICATION  *UNUSUAL SHORTNESS OF BREATH  *UNUSUAL BRUISING OR BLEEDING  TENDERNESS IN MOUTH AND THROAT WITH OR WITHOUT PRESENCE OF ULCERS  *URINARY PROBLEMS  *BOWEL PROBLEMS  UNUSUAL RASH Items with * indicate a potential emergency and should be followed up as soon as possible.  Feel free to call the clinic you have any questions or concerns. The clinic phone number is [phone].

## 2013-11-05 ENCOUNTER — Other Ambulatory Visit: Payer: Self-pay | Admitting: Oncology

## 2013-11-05 ENCOUNTER — Encounter: Payer: Self-pay | Admitting: Oncology

## 2013-11-05 ENCOUNTER — Ambulatory Visit (HOSPITAL_BASED_OUTPATIENT_CLINIC_OR_DEPARTMENT_OTHER): Payer: Medicare Other

## 2013-11-05 DIAGNOSIS — C3490 Malignant neoplasm of unspecified part of unspecified bronchus or lung: Secondary | ICD-10-CM

## 2013-11-05 DIAGNOSIS — Z5111 Encounter for antineoplastic chemotherapy: Secondary | ICD-10-CM

## 2013-11-05 MED ORDER — SODIUM CHLORIDE 0.9 % IJ SOLN
10.0000 mL | INTRAMUSCULAR | Status: DC | PRN
  Administered 2013-11-05: 10 mL
  Filled 2013-11-05: qty 10

## 2013-11-05 MED ORDER — HEPARIN SOD (PORK) LOCK FLUSH 100 UNIT/ML IV SOLN
500.0000 [IU] | Freq: Once | INTRAVENOUS | Status: AC | PRN
  Administered 2013-11-05: 500 [IU]
  Filled 2013-11-05: qty 5

## 2013-11-05 MED ORDER — SODIUM CHLORIDE 0.9 % IV SOLN
80.0000 mg/m2 | Freq: Once | INTRAVENOUS | Status: AC
  Administered 2013-11-05: 170 mg via INTRAVENOUS
  Filled 2013-11-05: qty 8.5

## 2013-11-05 MED ORDER — PROCHLORPERAZINE MALEATE 10 MG PO TABS
10.0000 mg | ORAL_TABLET | Freq: Once | ORAL | Status: AC
  Administered 2013-11-05: 10 mg via ORAL

## 2013-11-05 MED ORDER — SODIUM CHLORIDE 0.9 % IV SOLN
Freq: Once | INTRAVENOUS | Status: AC
  Administered 2013-11-05: 15:00:00 via INTRAVENOUS

## 2013-11-05 MED ORDER — PROCHLORPERAZINE MALEATE 10 MG PO TABS
ORAL_TABLET | ORAL | Status: AC
  Filled 2013-11-05: qty 1

## 2013-11-06 ENCOUNTER — Ambulatory Visit (HOSPITAL_BASED_OUTPATIENT_CLINIC_OR_DEPARTMENT_OTHER): Payer: Medicare Other

## 2013-11-06 VITALS — BP 105/41 | HR 84 | Temp 98.4°F

## 2013-11-06 DIAGNOSIS — C3490 Malignant neoplasm of unspecified part of unspecified bronchus or lung: Secondary | ICD-10-CM

## 2013-11-06 DIAGNOSIS — Z5189 Encounter for other specified aftercare: Secondary | ICD-10-CM

## 2013-11-06 MED ORDER — PEGFILGRASTIM INJECTION 6 MG/0.6ML
6.0000 mg | Freq: Once | SUBCUTANEOUS | Status: AC
  Administered 2013-11-06: 6 mg via SUBCUTANEOUS
  Filled 2013-11-06: qty 0.6

## 2013-11-08 ENCOUNTER — Other Ambulatory Visit: Payer: Self-pay | Admitting: Oncology

## 2013-11-08 ENCOUNTER — Encounter: Payer: Self-pay | Admitting: Oncology

## 2013-11-11 ENCOUNTER — Ambulatory Visit (HOSPITAL_COMMUNITY)
Admission: RE | Admit: 2013-11-11 | Discharge: 2013-11-11 | Disposition: A | Discharge status: Home / Self Care | Payer: Medicare Other | Source: Ambulatory Visit | Attending: Oncology | Admitting: Oncology

## 2013-11-11 ENCOUNTER — Ambulatory Visit (HOSPITAL_COMMUNITY): Payer: Medicare Other

## 2013-11-11 DIAGNOSIS — C7952 Secondary malignant neoplasm of bone marrow: Secondary | ICD-10-CM

## 2013-11-11 DIAGNOSIS — C3490 Malignant neoplasm of unspecified part of unspecified bronchus or lung: Secondary | ICD-10-CM | POA: Insufficient documentation

## 2013-11-11 DIAGNOSIS — C787 Secondary malignant neoplasm of liver and intrahepatic bile duct: Secondary | ICD-10-CM | POA: Insufficient documentation

## 2013-11-11 DIAGNOSIS — C7951 Secondary malignant neoplasm of bone: Secondary | ICD-10-CM | POA: Insufficient documentation

## 2013-11-11 DIAGNOSIS — K869 Disease of pancreas, unspecified: Secondary | ICD-10-CM | POA: Insufficient documentation

## 2013-11-11 MED ORDER — IOHEXOL 300 MG/ML  SOLN
100.0000 mL | Freq: Once | INTRAMUSCULAR | Status: AC | PRN
  Administered 2013-11-11: 100 mL via INTRAVENOUS

## 2013-11-13 ENCOUNTER — Other Ambulatory Visit: Payer: Self-pay | Admitting: Oncology

## 2013-11-13 ENCOUNTER — Telehealth: Payer: Self-pay | Admitting: *Deleted

## 2013-11-13 ENCOUNTER — Telehealth: Payer: Self-pay | Admitting: Oncology

## 2013-11-13 ENCOUNTER — Other Ambulatory Visit (HOSPITAL_BASED_OUTPATIENT_CLINIC_OR_DEPARTMENT_OTHER): Payer: Medicare Other

## 2013-11-13 ENCOUNTER — Ambulatory Visit (HOSPITAL_BASED_OUTPATIENT_CLINIC_OR_DEPARTMENT_OTHER): Payer: Medicare Other | Admitting: Oncology

## 2013-11-13 VITALS — BP 79/46 | HR 71 | Temp 97.7°F | Resp 18 | Ht 65.0 in | Wt 202.8 lb

## 2013-11-13 DIAGNOSIS — C3490 Malignant neoplasm of unspecified part of unspecified bronchus or lung: Secondary | ICD-10-CM

## 2013-11-13 DIAGNOSIS — C259 Malignant neoplasm of pancreas, unspecified: Secondary | ICD-10-CM

## 2013-11-13 DIAGNOSIS — Z859 Personal history of malignant neoplasm, unspecified: Secondary | ICD-10-CM

## 2013-11-13 DIAGNOSIS — C787 Secondary malignant neoplasm of liver and intrahepatic bile duct: Secondary | ICD-10-CM

## 2013-11-13 DIAGNOSIS — C7A090 Malignant carcinoid tumor of the bronchus and lung: Secondary | ICD-10-CM

## 2013-11-13 LAB — CBC WITH DIFFERENTIAL/PLATELET
BASO%: 0.8 % (ref 0.0–2.0)
BASOS ABS: 0.1 10*3/uL (ref 0.0–0.1)
EOS%: 1 % (ref 0.0–7.0)
Eosinophils Absolute: 0.1 10*3/uL (ref 0.0–0.5)
HCT: 33.1 % — ABNORMAL LOW (ref 34.8–46.6)
HEMOGLOBIN: 10.8 g/dL — AB (ref 11.6–15.9)
LYMPH%: 29.5 % (ref 14.0–49.7)
MCH: 29.6 pg (ref 25.1–34.0)
MCHC: 32.6 g/dL (ref 31.5–36.0)
MCV: 90.7 fL (ref 79.5–101.0)
MONO#: 1 10*3/uL — ABNORMAL HIGH (ref 0.1–0.9)
MONO%: 13.4 % (ref 0.0–14.0)
NEUT#: 4 10*3/uL (ref 1.5–6.5)
NEUT%: 55.3 % (ref 38.4–76.8)
Platelets: 119 10*3/uL — ABNORMAL LOW (ref 145–400)
RBC: 3.65 10*6/uL — ABNORMAL LOW (ref 3.70–5.45)
RDW: 15.2 % — AB (ref 11.2–14.5)
WBC: 7.2 10*3/uL (ref 3.9–10.3)
lymph#: 2.1 10*3/uL (ref 0.9–3.3)

## 2013-11-13 LAB — COMPREHENSIVE METABOLIC PANEL (CC13)
ALBUMIN: 3.4 g/dL — AB (ref 3.5–5.0)
ALT: 13 U/L (ref 0–55)
AST: 53 U/L — ABNORMAL HIGH (ref 5–34)
Alkaline Phosphatase: 55 U/L (ref 40–150)
Anion Gap: 14 mEq/L — ABNORMAL HIGH (ref 3–11)
BUN: 19.2 mg/dL (ref 7.0–26.0)
CO2: 22 meq/L (ref 22–29)
Calcium: 9.9 mg/dL (ref 8.4–10.4)
Chloride: 103 mEq/L (ref 98–109)
Creatinine: 1.4 mg/dL — ABNORMAL HIGH (ref 0.6–1.1)
GLUCOSE: 107 mg/dL (ref 70–140)
POTASSIUM: 4.6 meq/L (ref 3.5–5.1)
SODIUM: 138 meq/L (ref 136–145)
TOTAL PROTEIN: 6.6 g/dL (ref 6.4–8.3)
Total Bilirubin: 0.25 mg/dL (ref 0.20–1.20)

## 2013-11-13 NOTE — Telephone Encounter (Signed)
per pof to sch pt trmts. labs, appt-sent MW emailt o sch trmts-sch Dr Leodis Rains pt contrast for MRI-adv [facility] will call to sch-gave pt contras

## 2013-11-13 NOTE — Progress Notes (Signed)
[facility] OFFICE PROGRESS NOTE   PCP: [name], NP GYN: SU:  OTHER MD: Tyler Pita, PatrickHung  CC: metastatic neuroendocrine lung cancer TREATMENT: receiving chemotherapy   Lung cancer history: From Dr. Rolan Bucco  07/30/2012 note:  "1. 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  2. Squamous cell carcinoma of the right lower lobe T1 resected at the  time of surgery on 08/09/2005. All resected lymph nodes were  negative.  3. Non small cell lung cancer of the right upper lobe detected in March  2012, +NABx 09/21/10, with positive PET scan treated with stereotactic  radiation 5000 cGy in 5 fractions from 10/17/2010 through 10/27/2010.  4. Lingular nodule which appears to be increasing in size. This  lesion may have been present on a CT scan of the chest going back  to 08/30/2010. A PET scan carried out on 03/14/2012 showed no evidence of metastatic disease. Core needle biopsy of the lingular nodule was carried out on 04/01/2012 and showed invasive poorly differentiated carcinoma with predominant small cell carcinoma component and minor squamous cell component. Stains for p63, chromogranin and synaptophysin were diffusely positive. Cytokeratin 5/6 was positive in the squamous cell carcinoma component, but negative in the rest of the tumor. The small cell component comprised about 90%  of the tumor. Slides were reviewed with Dr. Lynnell Chad. The lesion was  treated with stereotactic body radiotherapy (SBRT), 18 Gy per session, for  a total of 3 sessions, administered on 04/22/2012, 04/24/2012, and 04/29/2012, for a total of 54 Gy. The patient started chemotherapy with carboplatin and VP-16 on 05/19/2012 for a planned 4 cycles.  Cycle #4 was completed on 07/31/2012."  Subsequently the patient was evaluated by Dr.Khan, after the patient presented to the emergency room with a cough and chest pain and found to have several cracked ribs. A CT angiogram found a right lower lobe nodule concerning for recurrence and she was referred back to oncology for further evaluation. PET scan 07/03/2013 showed, aside from 2 right lung nodules which were nonspecific, new hypermetabolic activity within the head of the pancreas. MRI of the abdomen obtained 07/24/2013 confirmed a soft tissue mass centered at the ventral aspect of the pancreatic head measuring 3.3 cm, as well as innumerable liver lesions which were new as compared to July of 2014. On 08/28/2013 the patient underwent right liver lobe biopsy, which showed (SZ [phone]) a metastatic poorly differentiated neuroendocrine carcinoma which was positive for CD 56, chromogranin, and synaptophysin. TTF-1 was positive, consistent with a lung primary.  The patient's subsequent history is as detailed below   INTERVAL HISTORY: Severina returns today accompanied by her daughter [name] for followup of her recurrent lung cancer.  She's currently day 11 cycle 2 of carboplatin/ etoposide, the carboplatin given on day 1 and the etoposide given on days 1, 2, and 3 of each 21 day cycle, with Neulasta given on day 4 for granulocyte support. Cycle 1 was curtailed because of GI bleeding, which was evaluated by Dr. Benson Norway. The patient received all 3 days of cycle 2.  REVIEW OF SYSTEMS:   Neila tells me cycle 2 was a little bit harder on her. She "got sick this time". She vomited one time. She continues to have bleeding from her rectum. She is working closely  with GI on this and has been told if it continues she should go to the emergency room and got admitted. She sleeps poorly. She is fatigued. She has pain in her back which is stabbing and constant. She cannot hear well. Her dentures don't fit well. She has  seasonal sinus allergies. She complains of an irregular heartbeat, poor circulation, and ankle swelling, shortness of breath when walking, a dry cough, heartburn, poor appetite, and easy bruising. With all her problems she keeps a very positive and even jolly disposition. A detailed review of systems today was otherwise stable   MEDICAL HISTORY: Past Medical History  Diagnosis Date  . Cancer     lung ca, RUL  . CHF (congestive heart failure)   . Hypertension   . Hx pulmonary embolism 03/2011    on coumadin till 02/2011,stopped  due to gi bleed  . GERD (gastroesophageal reflux disease)   . GI bleed   . Colitis     history  . Chest pain 06/30/11    hx blood clot in lung  . Bell's palsy   . PVD (peripheral vascular disease)   . Lung cancer     metachronous stage I nscca RUL  . History of radiation therapy 5/8/10th,14th,16th,and 10/27/2010    RUL lung nscca  . Blood transfusion     during lumbar surgery  . History of chemotherapy 2006-207    carboplatin/VP16 last January 207  . Clotting disorder     DVT/ PE  . Lung cancer 04/01/12    biopsy-lingular nodule-inv carcinoma, minor squamous cell ca  . History of radiation therapy 11/12, 11/14, 04/29/2012    lingular -squamous cell carcinoma, nodule  . Paroxysmal atrial fibrillation   . On continuous oral anticoagulation   . Diabetes mellitus without complication     patient states "diet controlled."   ALLERGIES:  is allergic to aspirin.  MEDICATIONS: has a current medication list which includes the following prescription(s): acetaminophen, alendronate, magic mouthwash w/lidocaine, carboplatin, cimetidine, cyanocobalamin, diphenhydramine-acetaminophen, vitamin d2, etoposide, fenofibrate, garlic, glucosamine-chondroitin, hydrocortisone, lidocaine-prilocaine, methocarbamol, metoprolol tartrate, omega-3 fatty acids, ondansetron, oxycodone-acetaminophen, pantoprazole, prochlorperazine, ropinirole, sennosides-docusate sodium, simvastatin,  tiotropium, and vitamin c.  SURGICAL HISTORY:  Past Surgical History  Procedure Laterality Date  . Right lower lobe lung resection  2007  . Cataract surgery      b/l  lens implants  . Abdominal hysterectomy      partial age 30  . Shoulder open rotator cuff repair  04/2003    left  . Appendectomy    . Carpal tunnel release    . Bladder surgery    . Lumbar spine surgery      x3, L4-S1  . Right lung biopsy  2006  . Coronary angioplasty with stent placement  2001    renal stent  . Colonoscopies  2011    polyp removal   . 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 She is a widow and lives with her daughter [name], who is herself disabled. [name] has a history of breast cancer dating back to 2005 and apparently developed congestive heart failure. The patient has 2 other children, her son [name] who lives in [place] and is disabled secondary to back pain and Jasmine Awe and "has  an anger problem". The patient has 2 biological and 2 Vicente Serene the grandchildren and 4 great-grandchildren. She is a Psychologist, forensic.  ADVANCED DIRECTIVES: On her 10/02/2013 visit the patient was given healthcare power of attorney documents which she completed and notarized. She has named her daughter [name] as her healthcare power of attorney. [name] can be reached at the patient's home phone. At that same visit we also discussed end-of-life issues. The patient is very optimistic and wishes to have any treatment that may prolong her life or make her life better. She agrees that in case of a terminal event she should not be "placed on machines or life support". Her daughter was present during this discussion.  PHYSICAL EXAMINATION: Elderly white woman examined  in a wheelchair BP 79/46  Pulse 71  Temp(Src) 97.7 F (36.5 C) (Oral)  Resp 18  Ht 5\' 5"  (1.651 m)  Wt 202 lb 12.8 oz (91.989 kg)  BMI 33.75 kg/m2  SpO2 95%  ECOG PERFORMANCE STATUS: 2 - Symptomatic, <50% confined to bed  Sclerae unicteric, EOMs intact Oropharynx clear and moist No cervical or supraclavicular adenopathy Lungs no rales or rhonchi, fair excursion Heart regular rate and rhythm Abd soft, obese, nontender, positive bowel sounds MSK no focal spinal tenderness Neuro: nonfocal, well oriented, positive affect Breasts: Deferred  LABORATORY DATA: Results for orders placed in visit on 11/13/13 (from the past 48 hour(s))  CBC WITH DIFFERENTIAL     Status: Abnormal   Collection Time    11/13/13 10:30 AM      Result Value Ref Range   WBC 7.2  3.9 - 10.3 10e3/uL   NEUT# 4.0  1.5 - 6.5 10e3/uL   HGB 10.8 (*) 11.6 - 15.9 g/dL   HCT 33.1 (*) 34.8 - 46.6 %   Platelets 119 (*) 145 - 400 10e3/uL   MCV 90.7  79.5 - 101.0 fL   MCH 29.6  25.1 - 34.0 pg   MCHC 32.6  31.5 - 36.0 g/dL   RBC 3.65 (*) 3.70 - 5.45 10e6/uL   RDW 15.2 (*) 11.2 - 14.5 %   lymph# 2.1  0.9 - 3.3 10e3/uL   MONO# 1.0 (*) 0.1 - 0.9 10e3/uL   Eosinophils Absolute 0.1  0.0 - 0.5 10e3/uL   Basophils Absolute 0.1  0.0 - 0.1 10e3/uL   NEUT% 55.3  38.4 - 76.8 %   LYMPH% 29.5  14.0 - 49.7 %   MONO% 13.4  0.0 - 14.0 %   EOS% 1.0  0.0 - 7.0 %   BASO% 0.8  0.0 - 2.0 %    RADIOGRAPHIC STUDIES: Ct Chest W Contrast  11/11/2013   CLINICAL DATA:  History of lung cancer status post wedge resection in the right lung. Prior history of chemotherapy and radiation therapy.  EXAM: CT CHEST AND ABDOMEN WITH CONTRAST  TECHNIQUE: Multidetector CT imaging of the chest and abdomen was performed following the standard protocol during bolus administration of intravenous contrast.  CONTRAST:  150mL OMNIPAQUE IOHEXOL 300 MG/ML  SOLN  COMPARISON:  Chest CT 05/18/2013.  PET-CT 07/03/2013.  FINDINGS: CT CHEST FINDINGS  Mediastinum: Heart  size is mildly enlarged. There is no significant pericardial fluid, thickening or pericardial calcification. There is atherosclerosis of the thoracic aorta, the great vessels of the mediastinum and the coronary arteries, including calcified atherosclerotic plaque in the left main, left anterior descending, left circumflex and right coronary arteries. Dilatation of the pulmonic trunk (4.1 cm in diameter), which may suggest underlying pulmonary arterial hypertension. No pathologically enlarged  mediastinal or hilar lymph nodes. Small hiatal hernia.  Lungs/Pleura: Peripheral nodular appearing area of architectural distortion in the lateral aspect of the right upper lobe it is similar to the prior study, and is nonspecific. On today's examination, this region measures approximately 2.3 x 1.5 cm (image 15 of series 2), similar to the prior. There is also a similar-appearing linear opacity in the inferior segment of the lingula, and to a lesser extent in the lateral aspect of the left lower lobe, favored to be posttreatment related changes at site of prior radiation therapy given the linear appearance. 7 mm nodule in the medial aspect of the apex of the right upper lobe (image 14 of series 5) unchanged. No other definite new or enlarging suspicious appearing pulmonary nodules or masses are noted. No pleural effusions. Background of mild diffuse bronchial wall thickening with mild centrilobular and paraseptal emphysema.  Musculoskeletal: There is a mixed lytic and sclerotic lesion in the anterolateral aspect of the right second rib immediately superficial to the previously described peripheral nodular appearing area of architectural distortion in the lateral aspect of the right upper lobe. This rib lesion has a pathologic fracture through it. These findings are similar to prior examinations. There is also a healing fracture of the anterolateral aspect of the left fourth rib, and a lytic lesion with pathologic fracture in  the lateral aspect of the left fifth rib, which are new compared to prior examinations.  CT ABDOMEN AND PELVIS FINDINGS  Abdomen: Innumerable low-attenuation hepatic lesions are scattered throughout the hepatic parenchyma, compatible with widespread metastatic disease, with the largest lesion measuring up to 2.5 x 2.0 cm in the superior aspects of segments 7 and 8. The liver has a very nodular contour, which is favored to be related to diffuse metastatic disease (rather than pre-existing cirrhosis). In the head of the pancreas there is an aggressive appearing somewhat ill-defined enhancing soft tissue mass which is centrally hypoenhancing with avid peripheral enhancement, measuring approximately 6.0 x 4.5 cm, concerning for primary pancreatic neoplasm. This extends anteriorly where it is intimately associated with the undersurface of the duodenal bulb, and the medial surface of the second portion of the duodenum. This is not associated with significant pancreatic ductal dilatation at this time, nor is there evidence for common bile duct dilatation as the common bile duct and intrahepatic biliary tree are normal in caliber. Mildly enlarged peripancreatic lymph node adjacent to the third portion of the duodenum measuring 10 mm (image 73 of series 2). This pancreatic mass is intimately associated with and nearly completely encases the superior mesenteric vein, and this mass appears intimately associated with the gastroduodenal artery. At this time, this mass is in contact with the superior mesenteric artery oevr approximately 30% of its circumference. This mass appears separate from the portal vein which remains patent at this time.  The appearance of the gallbladder, spleen and bilateral adrenal glands is unremarkable. Kidneys are mildly atrophic bilaterally. Sub cm low-attenuation lesions in the kidneys bilaterally too small to definitively characterize. In addition there is a 1.6 cm low-attenuation lesion in the  lateral aspect of the interpolar region of the left kidney, compatible with a small simple cyst. Atherosclerosis throughout the abdominal and pelvic vasculature, without evidence of aneurysm. There is a small stent in the infrarenal abdominal aorta which appears grossly patent. Within the visualized portions of the peritoneal cavity there is no significant volume of ascites, no pneumoperitoneum and no pathologic distention of small bowel.  Musculoskeletal: Orthopedic fixation hardware in  the lumbar spine incompletely visualized. There are no aggressive appearing lytic or blastic lesions noted in the visualized portions of the skeleton.  IMPRESSION: 1. Today's study demonstrates progression of metastatic disease to the liver and ribs, as detailed above. It is uncertain whether or not this represents metastatic disease from lung cancer, or (more likely) represents new metastatic disease from a large mass in the head of the pancreas which is presumably a primary pancreatic malignancy. 2. Previously noted areas in the lungs appears similar to prior studies, favored to represent areas of post treatment related change. However, if the patient has undergone recent chemotherapy, the possibility of stable neoplasm is difficult to exclude. Continued attention on future examinations is recommended. 3. Additional findings similar to prior studies, as above.   Electronically Signed   By: Vinnie Langton M.D.   On: 11/11/2013 13:57   ASSESSMENT: 80 y.o. [place], [place] woman   (1) with a history of neuroendocrine lung cancer (small cell close(biopsied November of 2006, and treated with carboplatin and VP-16 between November of 2006 and January of 2007, followed by right lower lobe lobectomy March of 2007  (2) squamous cell carcinoma of the right lower lobe, T1 N0, resected at the same time as above  (3) non-small cell lung cancer of the right upper lobe diagnosed April of 2012, treated with stereotactic  radiation (50 gray in 5 fractions) completed may of 2012  (4) metastatic neuroendocrine lung cancer (recurrence from #1 above) diagnosed by liver biopsy 09/05/2013  (a) started carboplatin/etoposide 10/12/2013, day 3 omitted cycle because of unrelated problems  (b)  treatment with carboplatin/etoposide resumed on 11/03/2013   PLAN:  Lamis's restaging scan suggests progression, but her daughter points out, correctly, that the patient had received only one treatment between scans. We again discussed switching to a comfort care/ Hospice mode, but the patient and her daughter are very clear that they would like for Cathren to continue treatment "unless she can't tolerate it".  Currently the biggest problem she is having is bright red blood per rectum. Their working with TI on this and indeed she may end up getting admitted for further evaluation and treatment.  Assuming all goes well, her next cycle is due June 15. I have entered the appropriate chemotherapy orders, as well as orders for her Neulasta, which he receives on day 4, and additional visits for her fourth cycle of chemotherapy. If all goes well, she will have completed 4 cycles by the time she returns to see me in the second-half of July and she will have a repeat staging scan at that time.  Though the patient desires aggressive treatment, she is also very clear that in case of a terminal event she does not want to be on "machines for life support". I concur with her decision.  She knows to call for any problems that may develop before her next visit here.     Chauncey Cruel, MD 11/13/2013 11:24 AM

## 2013-11-13 NOTE — Telephone Encounter (Signed)
adv pt that once trnmt sch I will call wioth sch-pt stated that has MY CHART to review the sch

## 2013-11-13 NOTE — Telephone Encounter (Signed)
Per staff message and POF I have scheduled appts.  JMW  

## 2013-11-14 LAB — CEA: CEA: 85.5 ng/mL — AB (ref 0.0–5.0)

## 2013-11-14 LAB — CANCER ANTIGEN 19-9: CA 19-9: 18.3 U/mL (ref <35.0)

## 2013-11-15 ENCOUNTER — Other Ambulatory Visit: Payer: Self-pay | Admitting: Oncology

## 2013-11-23 ENCOUNTER — Ambulatory Visit (HOSPITAL_BASED_OUTPATIENT_CLINIC_OR_DEPARTMENT_OTHER): Payer: Medicare Other

## 2013-11-23 ENCOUNTER — Other Ambulatory Visit (HOSPITAL_BASED_OUTPATIENT_CLINIC_OR_DEPARTMENT_OTHER): Payer: Medicare Other

## 2013-11-23 ENCOUNTER — Encounter: Payer: Self-pay | Admitting: Oncology

## 2013-11-23 ENCOUNTER — Ambulatory Visit (HOSPITAL_BASED_OUTPATIENT_CLINIC_OR_DEPARTMENT_OTHER): Payer: Medicare Other | Admitting: Oncology

## 2013-11-23 VITALS — BP 104/65 | HR 80 | Temp 98.4°F | Resp 18 | Ht 65.0 in | Wt 199.1 lb

## 2013-11-23 DIAGNOSIS — C7A1 Malignant poorly differentiated neuroendocrine tumors: Secondary | ICD-10-CM

## 2013-11-23 DIAGNOSIS — R944 Abnormal results of kidney function studies: Secondary | ICD-10-CM

## 2013-11-23 DIAGNOSIS — C787 Secondary malignant neoplasm of liver and intrahepatic bile duct: Secondary | ICD-10-CM

## 2013-11-23 DIAGNOSIS — K625 Hemorrhage of anus and rectum: Secondary | ICD-10-CM

## 2013-11-23 DIAGNOSIS — Z86711 Personal history of pulmonary embolism: Secondary | ICD-10-CM

## 2013-11-23 DIAGNOSIS — C3490 Malignant neoplasm of unspecified part of unspecified bronchus or lung: Secondary | ICD-10-CM

## 2013-11-23 DIAGNOSIS — J90 Pleural effusion, not elsewhere classified: Secondary | ICD-10-CM

## 2013-11-23 DIAGNOSIS — C7B8 Other secondary neuroendocrine tumors: Secondary | ICD-10-CM

## 2013-11-23 DIAGNOSIS — Z5111 Encounter for antineoplastic chemotherapy: Secondary | ICD-10-CM

## 2013-11-23 LAB — CBC WITH DIFFERENTIAL/PLATELET
BASO%: 0.5 % (ref 0.0–2.0)
Basophils Absolute: 0.1 10*3/uL (ref 0.0–0.1)
EOS%: 0.4 % (ref 0.0–7.0)
Eosinophils Absolute: 0.1 10*3/uL (ref 0.0–0.5)
HEMATOCRIT: 36.3 % (ref 34.8–46.6)
HGB: 11.7 g/dL (ref 11.6–15.9)
LYMPH%: 14 % (ref 14.0–49.7)
MCH: 30.2 pg (ref 25.1–34.0)
MCHC: 32.3 g/dL (ref 31.5–36.0)
MCV: 93.5 fL (ref 79.5–101.0)
MONO#: 1.6 10*3/uL — AB (ref 0.1–0.9)
MONO%: 12.2 % (ref 0.0–14.0)
NEUT#: 9.3 10*3/uL — ABNORMAL HIGH (ref 1.5–6.5)
NEUT%: 72.9 % (ref 38.4–76.8)
Platelets: 181 10*3/uL (ref 145–400)
RBC: 3.89 10*6/uL (ref 3.70–5.45)
RDW: 17.5 % — ABNORMAL HIGH (ref 11.2–14.5)
WBC: 12.8 10*3/uL — AB (ref 3.9–10.3)
lymph#: 1.8 10*3/uL (ref 0.9–3.3)

## 2013-11-23 LAB — COMPREHENSIVE METABOLIC PANEL (CC13)
ALT: 8 U/L (ref 0–55)
AST: 68 U/L — AB (ref 5–34)
Albumin: 3.5 g/dL (ref 3.5–5.0)
Alkaline Phosphatase: 46 U/L (ref 40–150)
Anion Gap: 11 mEq/L (ref 3–11)
BILIRUBIN TOTAL: 0.27 mg/dL (ref 0.20–1.20)
BUN: 16.8 mg/dL (ref 7.0–26.0)
CO2: 28 meq/L (ref 22–29)
CREATININE: 1.3 mg/dL — AB (ref 0.6–1.1)
Calcium: 9.9 mg/dL (ref 8.4–10.4)
Chloride: 100 mEq/L (ref 98–109)
Glucose: 130 mg/dl (ref 70–140)
Potassium: 3.5 mEq/L (ref 3.5–5.1)
SODIUM: 139 meq/L (ref 136–145)
TOTAL PROTEIN: 6.8 g/dL (ref 6.4–8.3)

## 2013-11-23 MED ORDER — ETOPOSIDE CHEMO INJECTION 1 GM/50ML
80.0000 mg/m2 | Freq: Once | INTRAVENOUS | Status: AC
  Administered 2013-11-23: 170 mg via INTRAVENOUS
  Filled 2013-11-23: qty 8.5

## 2013-11-23 MED ORDER — ONDANSETRON 16 MG/50ML IVPB (CHCC)
INTRAVENOUS | Status: AC
  Filled 2013-11-23: qty 16

## 2013-11-23 MED ORDER — SODIUM CHLORIDE 0.9 % IV SOLN
290.0000 mg | Freq: Once | INTRAVENOUS | Status: AC
  Administered 2013-11-23: 290 mg via INTRAVENOUS
  Filled 2013-11-23: qty 29

## 2013-11-23 MED ORDER — HYDROCORTISONE 2.5 % RE CREA: TOPICAL_CREAM | Freq: Three times a day (TID) | RECTAL | Status: DC

## 2013-11-23 MED ORDER — SODIUM CHLORIDE 0.9 % IJ SOLN
10.0000 mL | INTRAMUSCULAR | Status: DC | PRN
  Administered 2013-11-23: 10 mL
  Filled 2013-11-23: qty 10

## 2013-11-23 MED ORDER — CARBOPLATIN CHEMO INTRADERMAL TEST DOSE 100MCG/0.02ML
100.0000 ug | Freq: Once | INTRADERMAL | Status: AC
  Administered 2013-11-23: 100 ug via INTRADERMAL
  Filled 2013-11-23: qty 0.01

## 2013-11-23 MED ORDER — DEXAMETHASONE SODIUM PHOSPHATE 20 MG/5ML IJ SOLN
INTRAMUSCULAR | Status: AC
  Filled 2013-11-23: qty 5

## 2013-11-23 MED ORDER — SODIUM CHLORIDE 0.9 % IV SOLN
Freq: Once | INTRAVENOUS | Status: AC
  Administered 2013-11-23: 09:00:00 via INTRAVENOUS

## 2013-11-23 MED ORDER — DEXAMETHASONE SODIUM PHOSPHATE 20 MG/5ML IJ SOLN
20.0000 mg | Freq: Once | INTRAMUSCULAR | Status: AC
  Administered 2013-11-23: 20 mg via INTRAVENOUS

## 2013-11-23 MED ORDER — HEPARIN SOD (PORK) LOCK FLUSH 100 UNIT/ML IV SOLN
500.0000 [IU] | Freq: Once | INTRAVENOUS | Status: AC | PRN
  Administered 2013-11-23: 500 [IU]
  Filled 2013-11-23: qty 5

## 2013-11-23 MED ORDER — PROCHLORPERAZINE MALEATE 10 MG PO TABS: 10.0000 mg | ORAL_TABLET | Freq: Four times a day (QID) | ORAL | Status: AC | PRN

## 2013-11-23 MED ORDER — ONDANSETRON 16 MG/50ML IVPB (CHCC)
16.0000 mg | Freq: Once | INTRAVENOUS | Status: AC
  Administered 2013-11-23: 16 mg via INTRAVENOUS

## 2013-11-23 MED ORDER — ONDANSETRON HCL 8 MG PO TABS: ORAL_TABLET | ORAL | Status: AC

## 2013-11-23 NOTE — Progress Notes (Signed)
OFFICE PROGRESS NOTE   11/23/2013   Physicians: G.Magrinat, M.Tammi Klippel, (D.Murinson, K.Khan), P.Hung  INTERVAL HISTORY:  Patient is seen, together with daughter, for first time by this MD. 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  She has had intermittent nausea controlled with prn zofran and prn compazine, but has vomited only once; yesterday for breakfast she ate ham, eggs, gravy, biscuit and for supper chicken soup. This nausea began prior to starting this chemoShe has had no further BRBPR since hospital admission requiring transfusions for hemorrhoidal bleeding after cycle 1 of this chemo. She has not been able to manage sitz baths at home; she uses proctofoam cream only when the hemorrhoids seem irritated. Bowels are moving every other day with senokot S and prune juice every other day "any more gives her diarrhea". She is in bed most of day, did get up to watch daughter cook yesterday. Most recent imaging was CT AP on 11-11-13, which still showed very extensive disease in liver, head of pancreas region and ribs. Patient and daughter discussed scan information with Dr Jana Hakim on 11-13-13 with plan to rescan after cycle 4.   ONCOLOGIC HISTORY 1. 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  2. Squamous cell carcinoma of the right lower lobe T1 resected at the  time of surgery on 08/09/2005. All resected lymph nodes were  negative.  3. Non small cell lung cancer of the right upper lobe detected in March  2012, +NABx 09/21/10, with positive PET scan treated with stereotactic  radiation 5000 cGy in 5 fractions from 10/17/2010 through 10/27/2010.  4. Lingular  nodule which appears to be increasing in size. This  lesion may have been present on a CT scan of the chest going back  to 08/30/2010. A PET scan carried out on 03/14/2012 showed no evidence of metastatic disease. Core needle biopsy of the lingular nodule was carried out on 04/01/2012 and showed invasive poorly differentiated carcinoma with predominant small cell carcinoma component and minor squamous cell component. Stains for p63, chromogranin and synaptophysin were diffusely positive. Cytokeratin 5/6 was positive in the squamous cell carcinoma component, but negative in the rest of the tumor. The small cell component comprised about 90%  of the tumor. Slides were reviewed with Dr. Lynnell Chad. The lesion was  treated with stereotactic body radiotherapy (SBRT), 18 Gy per session, for  a total of 3 sessions, administered on 04/22/2012, 04/24/2012, and 04/29/2012, for a total of 54 Gy. The patient started chemotherapy with carboplatin and VP-16 on 05/19/2012 for a planned 4 cycles. Cycle #4 was completed on 07/31/2012. 5.Recurrent metastatic poorly differentiated neuroendocrine carcinoma to liver and head of pancreas by PET 07-03-13 and liver biopsy 08-28-13 ([id number]). Patient desired further treatment. Carboplatin etoposide was begun 10-12-2013, day 3 held due to hospitalization for GI bleed related to hemorrhoids. She is due day 1 cycle 3 today.    Review of systems as above, also: Breathing ok on home O2 2liters continuously. Some cough NP. Pain in back felt to be from arthritis (no bony uptake on PET 06-2013 but rib involvement by CT 11-11-13). No fever or symptoms of infection. No LE swelling. No other bleeding. No problems with PAC. Remainder of 10  point Review of Systems negative.  Objective:  Vital signs in last 24 hours:  BP 104/65  Pulse 80  Temp(Src) 98.4 F (36.9 C) (Oral)  Resp 18  Ht 5\' 5"  (1.651 m)  Wt 199 lb 1.6 oz (90.311 kg)  BMI 33.13 kg/m2 Elderly lady in [facility] on oxygen,  daughter very supportive. NAD. Alert, oriented and appropriate. Partial slopecia  HEENT:PERRL, sclerae not icteric. Oral mucosa moist without lesions, posterior pharynx clear.  Neck supple. No JVD.  Lymphatics:no cervical,suraclavicular adenopathy Resp: diminished BS thruout, but no wheezes or crackles  and no dullness to percussion bilaterally Cardio: regular rate and rhythm. No gallop. GI: soft, nontender, not obviously distended, no appreciable mass or organomegaly. Diminished bowel sounds.  Musculoskeletal/ Extremities: without pitting edema, cords, tenderness Neuro: speech fluent and appropriate, Moves all extremities in WC. Otherwise nonfocal on exam just in [facility] Skin without rash, ecchymosis, petechiae Portacath-without erythema or tenderness  Lab Results:  Results for orders placed in visit on 11/23/13  CBC WITH DIFFERENTIAL      Result Value Ref Range   WBC 12.8 (*) 3.9 - 10.3 10e3/uL   NEUT# 9.3 (*) 1.5 - 6.5 10e3/uL   HGB 11.7  11.6 - 15.9 g/dL   HCT 36.3  34.8 - 46.6 %   Platelets 181  145 - 400 10e3/uL   MCV 93.5  79.5 - 101.0 fL   MCH 30.2  25.1 - 34.0 pg   MCHC 32.3  31.5 - 36.0 g/dL   RBC 3.89  3.70 - 5.45 10e6/uL   RDW 17.5 (*) 11.2 - 14.5 %   lymph# 1.8  0.9 - 3.3 10e3/uL   MONO# 1.6 (*) 0.1 - 0.9 10e3/uL   Eosinophils Absolute 0.1  0.0 - 0.5 10e3/uL   Basophils Absolute 0.1  0.0 - 0.1 10e3/uL   NEUT% 72.9  38.4 - 76.8 %   LYMPH% 14.0  14.0 - 49.7 %   MONO% 12.2  0.0 - 14.0 %   EOS% 0.4  0.0 - 7.0 %   BASO% 0.5  0.0 - 2.0 %  COMPREHENSIVE METABOLIC PANEL (GU44)      Result Value Ref Range   Sodium 139  136 - 145 mEq/L   Potassium 3.5  3.5 - 5.1 mEq/L   Chloride 100  98 - 109 mEq/L   CO2 28  22 - 29 mEq/L   Glucose 130  70 - 140 mg/dl   BUN 16.8  7.0 - 26.0 mg/dL   Creatinine 1.3 (*) 0.6 - 1.1 mg/dL   Total Bilirubin 0.27  0.20 - 1.20 mg/dL   Alkaline Phosphatase 46  40 - 150 U/L   AST 68 (*) 5 - 34 U/L   ALT 8  0 - 55 U/L   Total Protein 6.8  6.4 -  8.3 g/dL   Albumin 3.5  3.5 - 5.0 g/dL   Calcium 9.9  8.4 - 10.4 mg/dL   Anion Gap 11  3 - 11 mEq/L  creatinine is stable back to 5-26 AST is down from 512 on 10-16-13    Studies/Results:  CT CAP 11-11-13 reports reviewed by MD, with discussion in Dr Virgie Dad last note seen.  Medications: I have reviewed the patient's current medications. Zofran, compazine and proctofoam cream all renewed. Daughter is not giving the antiemetics if no nausea. She will try using the proctofoam cream daily for next several weeks to soothe hemorrhoids more. She does not use pain medication regularly, has oxycodone 5-325 prn.  DISCUSSION: since unable  to do sitz bath, I recommended soaking wet washcloth to clean after BMs. Patient and daughter are encouraged with some improvement in LFTs; patient feels that side effects from treatment are tolerable and wants to continue.   Assessment/Plan:  1. Recent diagnosis of metastatic small cell carcinoma of lung involving liver, head of pancreas and possibly ribs, in 80 yo lady with history of mixed small cell/NSC in 2006, [place] in 2012 and small cell 2013, treated with chemotherapy and stereotactic radiation previously, and now receiving carboplatin etoposide in palliative attempt. Plan is to repeat scans after cycle 4. She will have day 1 cycle 3 today, with Rx also on 6-16 and 6-17 and with neulasta on 6-18; she will see APP on 6-25 prior to cycle 4 (if stable), then scans prior to seeing Dr Jana Hakim in late July. 2.per Dr Virgie Dad note of 10-19-13, Chauncey Reading is daughter [name] and patient does not want to be on machines or life support in case of terminal event 3.PAC in 4.significant rectal bleeding from hemorrhoids in 10-2013: will try regular proctofoam and sitz bath "equivalents" as above 5.Chronic lung disease on home O2 continuous. >100 pack year smoking, DC 2005. 6.CHF, paroxysmal afib, previous PE (anticoagulation DCd with GI bleed 2012), peripheral vascular disease, hx  diet controlled DM 7.creatinine slightly higher than baseline tho stable for past 3 weeks, carbo dosed by AUC. Follow.   Chemotherapy and neulasta orders confirmed for this cycle. Patient and daughter have had all questions answered to their satisfaction and are in agreement with plan.   Dinorah Masullo P, MD   11/23/2013, 9:00 AM

## 2013-11-23 NOTE — Patient Instructions (Signed)
[name] Discharge Instructions for Patients Receiving Chemotherapy  Today you received the following chemotherapy agents: Carboplatin and Etoposide   To help prevent nausea and vomiting after your treatment, we encourage you to take your nausea medication as prescribed.    If you develop nausea and vomiting that is not controlled by your nausea medication, call the clinic.   BELOW ARE SYMPTOMS THAT SHOULD BE REPORTED IMMEDIATELY:  *FEVER GREATER THAN 100.5 F  *CHILLS WITH OR WITHOUT FEVER  NAUSEA AND VOMITING THAT IS NOT CONTROLLED WITH YOUR NAUSEA MEDICATION  *UNUSUAL SHORTNESS OF BREATH  *UNUSUAL BRUISING OR BLEEDING  TENDERNESS IN MOUTH AND THROAT WITH OR WITHOUT PRESENCE OF ULCERS  *URINARY PROBLEMS  *BOWEL PROBLEMS  UNUSUAL RASH Items with * indicate a potential emergency and should be followed up as soon as possible.  Feel free to call the clinic you have any questions or concerns. The clinic phone number is [phone].

## 2013-11-24 ENCOUNTER — Ambulatory Visit (HOSPITAL_BASED_OUTPATIENT_CLINIC_OR_DEPARTMENT_OTHER): Payer: Medicare Other

## 2013-11-24 VITALS — BP 96/41 | HR 74 | Temp 98.4°F | Resp 22

## 2013-11-24 DIAGNOSIS — Z5111 Encounter for antineoplastic chemotherapy: Secondary | ICD-10-CM

## 2013-11-24 DIAGNOSIS — C3490 Malignant neoplasm of unspecified part of unspecified bronchus or lung: Secondary | ICD-10-CM

## 2013-11-24 DIAGNOSIS — C7A1 Malignant poorly differentiated neuroendocrine tumors: Secondary | ICD-10-CM

## 2013-11-24 DIAGNOSIS — C787 Secondary malignant neoplasm of liver and intrahepatic bile duct: Secondary | ICD-10-CM

## 2013-11-24 MED ORDER — HEPARIN SOD (PORK) LOCK FLUSH 100 UNIT/ML IV SOLN
500.0000 [IU] | Freq: Once | INTRAVENOUS | Status: AC | PRN
  Administered 2013-11-24: 500 [IU]
  Filled 2013-11-24: qty 5

## 2013-11-24 MED ORDER — PROCHLORPERAZINE MALEATE 10 MG PO TABS
ORAL_TABLET | ORAL | Status: AC
  Filled 2013-11-24: qty 1

## 2013-11-24 MED ORDER — PROCHLORPERAZINE MALEATE 10 MG PO TABS
10.0000 mg | ORAL_TABLET | Freq: Once | ORAL | Status: AC
  Administered 2013-11-24: 10 mg via ORAL

## 2013-11-24 MED ORDER — SODIUM CHLORIDE 0.9 % IJ SOLN
10.0000 mL | INTRAMUSCULAR | Status: DC | PRN
  Administered 2013-11-24: 10 mL
  Filled 2013-11-24: qty 10

## 2013-11-24 MED ORDER — SODIUM CHLORIDE 0.9 % IV SOLN
80.0000 mg/m2 | Freq: Once | INTRAVENOUS | Status: AC
  Administered 2013-11-24: 170 mg via INTRAVENOUS
  Filled 2013-11-24: qty 8.5

## 2013-11-24 MED ORDER — SODIUM CHLORIDE 0.9 % IV SOLN
Freq: Once | INTRAVENOUS | Status: AC
  Administered 2013-11-24: 12:00:00 via INTRAVENOUS

## 2013-11-24 NOTE — Progress Notes (Signed)
Discharged at 1330 with daughter via wheelchair after trip to rest room.  No distress.

## 2013-11-24 NOTE — Patient Instructions (Signed)
[facility] Discharge Instructions for Patients Receiving Chemotherapy  Today you received the following chemotherapy agents VP-16/Etopiside  To help prevent nausea and vomiting after your treatment, we encourage you to take your nausea medication Compazine 10 mg every six hours as ordered by provider.   If you develop nausea and vomiting that is not controlled by your nausea medication, call the clinic.   BELOW ARE SYMPTOMS THAT SHOULD BE REPORTED IMMEDIATELY:  *FEVER GREATER THAN 100.5 F  *CHILLS WITH OR WITHOUT FEVER  NAUSEA AND VOMITING THAT IS NOT CONTROLLED WITH YOUR NAUSEA MEDICATION  *UNUSUAL SHORTNESS OF BREATH  *UNUSUAL BRUISING OR BLEEDING  TENDERNESS IN MOUTH AND THROAT WITH OR WITHOUT PRESENCE OF ULCERS  *URINARY PROBLEMS  *BOWEL PROBLEMS  UNUSUAL RASH Items with * indicate a potential emergency and should be followed up as soon as possible.  Feel free to call the clinic you have any questions or concerns. The clinic phone number is [phone].

## 2013-11-24 NOTE — Progress Notes (Signed)
Fall precautions used during today's visit.  Stable with minimal assist and able to stand.

## 2013-11-25 ENCOUNTER — Ambulatory Visit (HOSPITAL_BASED_OUTPATIENT_CLINIC_OR_DEPARTMENT_OTHER): Payer: Medicare Other

## 2013-11-25 VITALS — BP 121/45 | HR 78 | Temp 98.5°F | Resp 20

## 2013-11-25 DIAGNOSIS — C787 Secondary malignant neoplasm of liver and intrahepatic bile duct: Secondary | ICD-10-CM

## 2013-11-25 DIAGNOSIS — C7A1 Malignant poorly differentiated neuroendocrine tumors: Secondary | ICD-10-CM

## 2013-11-25 DIAGNOSIS — C3490 Malignant neoplasm of unspecified part of unspecified bronchus or lung: Secondary | ICD-10-CM

## 2013-11-25 DIAGNOSIS — Z5111 Encounter for antineoplastic chemotherapy: Secondary | ICD-10-CM

## 2013-11-25 MED ORDER — HEPARIN SOD (PORK) LOCK FLUSH 100 UNIT/ML IV SOLN
500.0000 [IU] | Freq: Once | INTRAVENOUS | Status: AC | PRN
  Administered 2013-11-25: 500 [IU]
  Filled 2013-11-25: qty 5

## 2013-11-25 MED ORDER — SODIUM CHLORIDE 0.9 % IV SOLN
Freq: Once | INTRAVENOUS | Status: AC
  Administered 2013-11-25: 13:00:00 via INTRAVENOUS

## 2013-11-25 MED ORDER — SODIUM CHLORIDE 0.9 % IV SOLN
80.0000 mg/m2 | Freq: Once | INTRAVENOUS | Status: AC
  Administered 2013-11-25: 170 mg via INTRAVENOUS
  Filled 2013-11-25: qty 8.5

## 2013-11-25 MED ORDER — SODIUM CHLORIDE 0.9 % IJ SOLN
10.0000 mL | INTRAMUSCULAR | Status: DC | PRN
  Administered 2013-11-25: 10 mL
  Filled 2013-11-25: qty 10

## 2013-11-25 MED ORDER — PROCHLORPERAZINE MALEATE 10 MG PO TABS
ORAL_TABLET | ORAL | Status: AC
  Filled 2013-11-25: qty 1

## 2013-11-25 MED ORDER — PROCHLORPERAZINE MALEATE 10 MG PO TABS
10.0000 mg | ORAL_TABLET | Freq: Once | ORAL | Status: AC
  Administered 2013-11-25: 10 mg via ORAL

## 2013-11-25 NOTE — Patient Instructions (Signed)
[facility] Discharge Instructions for Patients Receiving Chemotherapy  Today you received the following chemotherapy agents Etoposide.  To help prevent nausea and vomiting after your treatment, we encourage you to take your nausea medication as prescribed.   If you develop nausea and vomiting that is not controlled by your nausea medication, call the clinic.   BELOW ARE SYMPTOMS THAT SHOULD BE REPORTED IMMEDIATELY:  *FEVER GREATER THAN 100.5 F  *CHILLS WITH OR WITHOUT FEVER  NAUSEA AND VOMITING THAT IS NOT CONTROLLED WITH YOUR NAUSEA MEDICATION  *UNUSUAL SHORTNESS OF BREATH  *UNUSUAL BRUISING OR BLEEDING  TENDERNESS IN MOUTH AND THROAT WITH OR WITHOUT PRESENCE OF ULCERS  *URINARY PROBLEMS  *BOWEL PROBLEMS  UNUSUAL RASH Items with * indicate a potential emergency and should be followed up as soon as possible.  Feel free to call the clinic you have any questions or concerns. The clinic phone number is [phone].

## 2013-11-26 ENCOUNTER — Ambulatory Visit (HOSPITAL_BASED_OUTPATIENT_CLINIC_OR_DEPARTMENT_OTHER): Payer: Medicare Other

## 2013-11-26 VITALS — BP 100/31 | HR 75 | Temp 97.9°F

## 2013-11-26 DIAGNOSIS — C3490 Malignant neoplasm of unspecified part of unspecified bronchus or lung: Secondary | ICD-10-CM

## 2013-11-26 DIAGNOSIS — C7A1 Malignant poorly differentiated neuroendocrine tumors: Secondary | ICD-10-CM

## 2013-11-26 DIAGNOSIS — Z5189 Encounter for other specified aftercare: Secondary | ICD-10-CM

## 2013-11-26 DIAGNOSIS — C787 Secondary malignant neoplasm of liver and intrahepatic bile duct: Secondary | ICD-10-CM

## 2013-11-26 MED ORDER — PEGFILGRASTIM INJECTION 6 MG/0.6ML
6.0000 mg | Freq: Once | SUBCUTANEOUS | Status: AC
  Administered 2013-11-26: 6 mg via SUBCUTANEOUS
  Filled 2013-11-26: qty 0.6

## 2013-12-02 ENCOUNTER — Other Ambulatory Visit: Payer: Self-pay | Admitting: Physician Assistant

## 2013-12-03 ENCOUNTER — Ambulatory Visit (HOSPITAL_BASED_OUTPATIENT_CLINIC_OR_DEPARTMENT_OTHER): Payer: Medicare Other | Admitting: Adult Health

## 2013-12-03 ENCOUNTER — Encounter: Payer: Self-pay | Admitting: Adult Health

## 2013-12-03 ENCOUNTER — Telehealth: Payer: Self-pay | Admitting: Oncology

## 2013-12-03 ENCOUNTER — Other Ambulatory Visit (HOSPITAL_BASED_OUTPATIENT_CLINIC_OR_DEPARTMENT_OTHER): Payer: Medicare Other

## 2013-12-03 VITALS — BP 108/60 | HR 80 | Temp 97.7°F | Resp 18 | Ht 65.0 in | Wt 198.2 lb

## 2013-12-03 DIAGNOSIS — C787 Secondary malignant neoplasm of liver and intrahepatic bile duct: Secondary | ICD-10-CM

## 2013-12-03 DIAGNOSIS — C7B8 Other secondary neuroendocrine tumors: Secondary | ICD-10-CM

## 2013-12-03 DIAGNOSIS — C7A1 Malignant poorly differentiated neuroendocrine tumors: Secondary | ICD-10-CM

## 2013-12-03 DIAGNOSIS — C3492 Malignant neoplasm of unspecified part of left bronchus or lung: Secondary | ICD-10-CM

## 2013-12-03 DIAGNOSIS — R29898 Other symptoms and signs involving the musculoskeletal system: Secondary | ICD-10-CM

## 2013-12-03 DIAGNOSIS — C3490 Malignant neoplasm of unspecified part of unspecified bronchus or lung: Secondary | ICD-10-CM

## 2013-12-03 DIAGNOSIS — Z859 Personal history of malignant neoplasm, unspecified: Secondary | ICD-10-CM

## 2013-12-03 LAB — COMPREHENSIVE METABOLIC PANEL (CC13)
ALT: 9 U/L (ref 0–55)
AST: 31 U/L (ref 5–34)
Albumin: 3.3 g/dL — ABNORMAL LOW (ref 3.5–5.0)
Alkaline Phosphatase: 59 U/L (ref 40–150)
Anion Gap: 9 mEq/L (ref 3–11)
BILIRUBIN TOTAL: 0.2 mg/dL (ref 0.20–1.20)
BUN: 29.3 mg/dL — ABNORMAL HIGH (ref 7.0–26.0)
CO2: 26 meq/L (ref 22–29)
Calcium: 9.6 mg/dL (ref 8.4–10.4)
Chloride: 103 mEq/L (ref 98–109)
Creatinine: 1.7 mg/dL — ABNORMAL HIGH (ref 0.6–1.1)
Glucose: 120 mg/dl (ref 70–140)
POTASSIUM: 4.6 meq/L (ref 3.5–5.1)
SODIUM: 138 meq/L (ref 136–145)
Total Protein: 6.4 g/dL (ref 6.4–8.3)

## 2013-12-03 LAB — CBC WITH DIFFERENTIAL/PLATELET
BASO%: 0.5 % (ref 0.0–2.0)
Basophils Absolute: 0 10*3/uL (ref 0.0–0.1)
EOS%: 0.7 % (ref 0.0–7.0)
Eosinophils Absolute: 0.1 10*3/uL (ref 0.0–0.5)
HCT: 29.1 % — ABNORMAL LOW (ref 34.8–46.6)
HGB: 9.5 g/dL — ABNORMAL LOW (ref 11.6–15.9)
LYMPH%: 22.9 % (ref 14.0–49.7)
MCH: 30.4 pg (ref 25.1–34.0)
MCHC: 32.6 g/dL (ref 31.5–36.0)
MCV: 93 fL (ref 79.5–101.0)
MONO#: 1 10*3/uL — AB (ref 0.1–0.9)
MONO%: 11.9 % (ref 0.0–14.0)
NEUT#: 5.5 10*3/uL (ref 1.5–6.5)
NEUT%: 64 % (ref 38.4–76.8)
Platelets: 115 10*3/uL — ABNORMAL LOW (ref 145–400)
RBC: 3.13 10*6/uL — AB (ref 3.70–5.45)
RDW: 17 % — AB (ref 11.2–14.5)
WBC: 8.6 10*3/uL (ref 3.9–10.3)
lymph#: 2 10*3/uL (ref 0.9–3.3)

## 2013-12-03 NOTE — Patient Instructions (Signed)
Stroke Prevention Some medical conditions and behaviors are associated with an increased chance of having a stroke. You may prevent a stroke by making healthy choices and managing medical conditions. HOW CAN I REDUCE MY RISK OF HAVING A STROKE?   Stay physically active. Get at least 30 minutes of activity on most or all days.  Do not smoke. It may also be helpful to avoid exposure to secondhand smoke.  Limit alcohol use. Moderate alcohol use is considered to be:  No more than 2 drinks per day for men.  No more than 1 drink per day for nonpregnant women.  Eat healthy foods. This involves  Eating 5 or more servings of fruits and vegetables a day.  Following a diet that addresses high blood pressure (hypertension), high cholesterol, diabetes, or obesity.  Manage your cholesterol levels.  A diet low in saturated fat, trans fat, and cholesterol and high in fiber may control cholesterol levels.  Take any prescribed medicines to control cholesterol as directed by your health care provider.  Manage your diabetes.  A controlled-carbohydrate, controlled-sugar diet is recommended to manage diabetes.  Take any prescribed medicines to control diabetes as directed by your health care provider.  Control your hypertension.  A low-salt (sodium), low-saturated fat, low-trans fat, and low-cholesterol diet is recommended to manage hypertension.  Take any prescribed medicines to control hypertension as directed by your health care provider.  Maintain a healthy weight.  A reduced-calorie, low-sodium, low-saturated fat, low-trans fat, low-cholesterol diet is recommended to manage weight.  Stop drug abuse.  Avoid taking birth control pills.  Talk to your health care provider about the risks of taking birth control pills if you are over 35 years old, smoke, get migraines, or have ever had a blood clot.  Get evaluated for sleep disorders (sleep apnea).  Talk to your health care provider about  getting a sleep evaluation if you snore a lot or have excessive sleepiness.  Take medicines as directed by your health care provider.  For some people, aspirin or blood thinners (anticoagulants) are helpful in reducing the risk of forming abnormal blood clots that can lead to stroke. If you have the irregular heart rhythm of atrial fibrillation, you should be on a blood thinner unless there is a good reason you cannot take them.  Understand all your medicine instructions.  Make sure that other other conditions (such as anemia or atherosclerosis) are addressed. SEEK IMMEDIATE MEDICAL CARE IF:   You have sudden weakness or numbness of the face, arm, or leg, especially on one side of the body.  Your face or eyelid droops to one side.  You have sudden confusion.  You have trouble speaking (aphasia) or understanding.  You have sudden trouble seeing in one or both eyes.  You have sudden trouble walking.  You have dizziness.  You have a loss of balance or coordination.  You have a sudden, severe headache with no known cause.  You have new chest pain or an irregular heartbeat. Any of these symptoms may represent a serious problem that is an emergency. Do not wait to see if the symptoms will go away. Get medical help at once. Call your local emergency services  (911 in [place] Do not drive yourself to the hospital. Document Released: 07/05/2004 Document Revised: 03/18/2013 Document Reviewed: 11/28/2012 [employer] Patient Information 2015 [employer]. This information is not intended to replace advice given to you by your health care provider. Make sure you discuss any questions you have with your health   care provider.  

## 2013-12-03 NOTE — Telephone Encounter (Signed)
gv dtr appt schedule for july. [facility] will call pt re mri appt - dtr aware. no other orders - appts remain the same.

## 2013-12-03 NOTE — Progress Notes (Signed)
[facility] OFFICE PROGRESS NOTE   PCP: [name], NP GYN: SU:  OTHER MD: Tyler Pita, PatrickHung  CC: metastatic neuroendocrine lung cancer TREATMENT: receiving chemotherapy   Lung cancer history: From Dr. Rolan Bucco  07/30/2012 note:  "1. 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  2. Squamous cell carcinoma of the right lower lobe T1 resected at the  time of surgery on 08/09/2005. All resected lymph nodes were  negative.  3. Non small cell lung cancer of the right upper lobe detected in March  2012, +NABx 09/21/10, with positive PET scan treated with stereotactic  radiation 5000 cGy in 5 fractions from 10/17/2010 through 10/27/2010.  4. Lingular nodule which appears to be increasing in size. This  lesion may have been present on a CT scan of the chest going back  to 08/30/2010. A PET scan carried out on 03/14/2012 showed no evidence of metastatic disease. Core needle biopsy of the lingular nodule was carried out on 04/01/2012 and showed invasive poorly differentiated carcinoma with predominant small cell carcinoma component and minor squamous cell component. Stains for p63, chromogranin and synaptophysin were diffusely positive. Cytokeratin 5/6 was positive in the squamous cell carcinoma component, but negative in the rest of the tumor. The small cell component comprised about 90%  of the tumor. Slides were reviewed with Dr. Lynnell Chad. The lesion was  treated with stereotactic body radiotherapy (SBRT), 18 Gy per session, for  a total of 3 sessions, administered on 04/22/2012, 04/24/2012, and 04/29/2012, for a total of 54 Gy. The patient started chemotherapy with carboplatin and VP-16 on 05/19/2012 for a planned 4 cycles.  Cycle #4 was completed on 07/31/2012."  Subsequently the patient was evaluated by Dr.Khan, after the patient presented to the emergency room with a cough and chest pain and found to have several cracked ribs. A CT angiogram found a right lower lobe nodule concerning for recurrence and she was referred back to oncology for further evaluation. PET scan 07/03/2013 showed, aside from 2 right lung nodules which were nonspecific, new hypermetabolic activity within the head of the pancreas. MRI of the abdomen obtained 07/24/2013 confirmed a soft tissue mass centered at the ventral aspect of the pancreatic head measuring 3.3 cm, as well as innumerable liver lesions which were new as compared to July of 2014. On 08/28/2013 the patient underwent right liver lobe biopsy, which showed (SZ B [phone]) a metastatic poorly differentiated neuroendocrine carcinoma which was positive for CD 56, chromogranin, and synaptophysin. TTF-1 was positive, consistent with a lung primary.  The patient's subsequent history is as detailed below   INTERVAL HISTORY: [name] returns today accompanied by her daughter [name] for followup of her recurrent lung cancer.  She's currently day 11 cycle 3 of carboplatin/ etoposide, the carboplatin given on day 1 and the etoposide given on days 1, 2, and 3 of each 21 day cycle, with Neulasta given on day 4 for granulocyte support.   She is doing well today.  She is with her daughter, and her daughter tells me that she has been nauseated daily, but it is relieved with anti-emetics.  She has a good appetite.  She has not had any further rectal bleeding.  She is c/o numbness in her right hand.  The patient tells me that she has  noticed a recent slight worsening in her vision.  She denies any numbness anywhere else, or any weakness.  She denies slurred speech or facial expression changes.  Otherwise, she is doing well and a 10 point ROS is neg.   REVIEW OF SYSTEMS:   A 10 point review of systems was  conducted and is otherwise negative except for what is noted above.      MEDICAL HISTORY: Past Medical History  Diagnosis Date  . Cancer     lung ca, RUL  . CHF (congestive heart failure)   . Hypertension   . Hx pulmonary embolism 03/2011    on coumadin till 02/2011,stopped  due to gi bleed  . GERD (gastroesophageal reflux disease)   . GI bleed   . Colitis     history  . Chest pain 06/30/11    hx blood clot in lung  . Bell's palsy   . PVD (peripheral vascular disease)   . Lung cancer     metachronous stage I nscca RUL  . History of radiation therapy 5/8/10th,14th,16th,and 10/27/2010    RUL lung nscca  . Blood transfusion     during lumbar surgery  . History of chemotherapy 2006-207    carboplatin/VP16 last January 207  . Clotting disorder     DVT/ PE  . Lung cancer 04/01/12    biopsy-lingular nodule-inv carcinoma, minor squamous cell ca  . History of radiation therapy 11/12, 11/14, 04/29/2012    lingular -squamous cell carcinoma, nodule  . Paroxysmal atrial fibrillation   . On continuous oral anticoagulation   . Diabetes mellitus without complication     patient states "diet controlled."   ALLERGIES:  is allergic to aspirin and tape.  MEDICATIONS: has a current medication list which includes the following prescription(s): alendronate, cimetidine, cyanocobalamin, diphenhydramine-acetaminophen, vitamin d2, fenofibrate, garlic, glucosamine-chondroitin, methocarbamol, metoprolol tartrate, omega-3 fatty acids, oxycodone-acetaminophen, pantoprazole, prochlorperazine, ropinirole, simvastatin, tiotropium, vitamin c, acetaminophen, magic mouthwash w/lidocaine, hydrocortisone, lidocaine-prilocaine, ondansetron, and sennosides-docusate sodium.  SURGICAL HISTORY:  Past Surgical History  Procedure Laterality Date  . Right lower lobe lung resection  2007  . Cataract surgery      b/l  lens implants  . Abdominal hysterectomy      partial age 20  . Shoulder open rotator cuff repair   04/2003    left  . Appendectomy    . Carpal tunnel release    . Bladder surgery    . Lumbar spine surgery      x3, L4-S1  . Right lung biopsy  2006  . Coronary angioplasty with stent placement  2001    renal stent  . Colonoscopies  2011    polyp removal   . 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 She is a widow and lives with her daughter [name], who is herself disabled. [name] has a history of breast cancer dating back to 2005 and apparently developed congestive heart failure. The patient has 2 other children, her son [name] who lives in [place] and is disabled secondary to back pain and Jasmine Awe and "has an anger problem". The patient has 2 biological and 2 Vicente Serene the grandchildren and 4 great-grandchildren. She is a Psychologist, forensic.  ADVANCED DIRECTIVES: On her 10/02/2013 visit the patient was given  healthcare power of attorney documents which she completed and notarized. She has named her daughter [name] as her healthcare power of attorney. [name] can be reached at the patient's home phone. At that same visit we also discussed end-of-life issues. The patient is very optimistic and wishes to have any treatment that may prolong her life or make her life better. She agrees that in case of a terminal event she should not be "placed on machines or life support". Her daughter was present during this discussion.  PHYSICAL EXAMINATION: Elderly white woman examined in a wheelchair BP 108/60  Pulse 80  Temp(Src) 97.7 F (36.5 C) (Oral)  Resp 18  Ht 5\' 5"  (1.651 m)  Wt 198 lb 3.2 oz (89.903 kg)  BMI 32.98 kg/m2  SpO2 94%  ECOG PERFORMANCE STATUS: 2 - Symptomatic, <50% confined to bed  Sclerae unicteric, EOMs  intact Oropharynx clear and moist No cervical or supraclavicular adenopathy Lungs no rales or rhonchi, fair excursion Heart regular rate and rhythm Abd soft, obese, nontender, positive bowel sounds MSK no focal spinal tenderness Neuro: nonfocal, well oriented, positive affect Breasts: Deferred  LABORATORY DATA: Results for orders placed in visit on 12/03/13 (from the past 48 hour(s))  CBC WITH DIFFERENTIAL     Status: Abnormal   Collection Time    12/03/13  1:08 PM      Result Value Ref Range   WBC 8.6  3.9 - 10.3 10e3/uL   NEUT# 5.5  1.5 - 6.5 10e3/uL   HGB 9.5 (*) 11.6 - 15.9 g/dL   HCT 29.1 (*) 34.8 - 46.6 %   Platelets 115 (*) 145 - 400 10e3/uL   MCV 93.0  79.5 - 101.0 fL   MCH 30.4  25.1 - 34.0 pg   MCHC 32.6  31.5 - 36.0 g/dL   RBC 3.13 (*) 3.70 - 5.45 10e6/uL   RDW 17.0 (*) 11.2 - 14.5 %   lymph# 2.0  0.9 - 3.3 10e3/uL   MONO# 1.0 (*) 0.1 - 0.9 10e3/uL   Eosinophils Absolute 0.1  0.0 - 0.5 10e3/uL   Basophils Absolute 0.0  0.0 - 0.1 10e3/uL   NEUT% 64.0  38.4 - 76.8 %   LYMPH% 22.9  14.0 - 49.7 %   MONO% 11.9  0.0 - 14.0 %   EOS% 0.7  0.0 - 7.0 %   BASO% 0.5  0.0 - 2.0 %  COMPREHENSIVE METABOLIC PANEL (ON62)     Status: Abnormal   Collection Time    12/03/13  1:09 PM      Result Value Ref Range   Sodium 138  136 - 145 mEq/L   Potassium 4.6  3.5 - 5.1 mEq/L   Chloride 103  98 - 109 mEq/L   CO2 26  22 - 29 mEq/L   Glucose 120  70 - 140 mg/dl   BUN 29.3 (*) 7.0 - 26.0 mg/dL   Creatinine 1.7 (*) 0.6 - 1.1 mg/dL   Total Bilirubin 0.20  0.20 - 1.20 mg/dL   Alkaline Phosphatase 59  40 - 150 U/L   AST 31  5 - 34 U/L   ALT 9  0 - 55 U/L   Total Protein 6.4  6.4 - 8.3 g/dL   Albumin 3.3 (*) 3.5 - 5.0 g/dL   Calcium 9.6  8.4 - 10.4 mg/dL   Anion Gap 9  3 - 11 mEq/L    RADIOGRAPHIC STUDIES: Ct Chest W Contrast  11/11/2013   CLINICAL DATA:  History of lung cancer  status post wedge resection in the right lung. Prior history of chemotherapy and radiation therapy.   EXAM: CT CHEST AND ABDOMEN WITH CONTRAST  TECHNIQUE: Multidetector CT imaging of the chest and abdomen was performed following the standard protocol during bolus administration of intravenous contrast.  CONTRAST:  19mL OMNIPAQUE IOHEXOL 300 MG/ML  SOLN  COMPARISON:  Chest CT 05/18/2013.  PET-CT 07/03/2013.  FINDINGS: CT CHEST FINDINGS  Mediastinum: Heart size is mildly enlarged. There is no significant pericardial fluid, thickening or pericardial calcification. There is atherosclerosis of the thoracic aorta, the great vessels of the mediastinum and the coronary arteries, including calcified atherosclerotic plaque in the left main, left anterior descending, left circumflex and right coronary arteries. Dilatation of the pulmonic trunk (4.1 cm in diameter), which may suggest underlying pulmonary arterial hypertension. No pathologically enlarged mediastinal or hilar lymph nodes. Small hiatal hernia.  Lungs/Pleura: Peripheral nodular appearing area of architectural distortion in the lateral aspect of the right upper lobe it is similar to the prior study, and is nonspecific. On today's examination, this region measures approximately 2.3 x 1.5 cm (image 15 of series 2), similar to the prior. There is also a similar-appearing linear opacity in the inferior segment of the lingula, and to a lesser extent in the lateral aspect of the left lower lobe, favored to be posttreatment related changes at site of prior radiation therapy given the linear appearance. 7 mm nodule in the medial aspect of the apex of the right upper lobe (image 14 of series 5) unchanged. No other definite new or enlarging suspicious appearing pulmonary nodules or masses are noted. No pleural effusions. Background of mild diffuse bronchial wall thickening with mild centrilobular and paraseptal emphysema.  Musculoskeletal: There is a mixed lytic and sclerotic lesion in the anterolateral aspect of the right second rib immediately superficial to the previously  described peripheral nodular appearing area of architectural distortion in the lateral aspect of the right upper lobe. This rib lesion has a pathologic fracture through it. These findings are similar to prior examinations. There is also a healing fracture of the anterolateral aspect of the left fourth rib, and a lytic lesion with pathologic fracture in the lateral aspect of the left fifth rib, which are new compared to prior examinations.  CT ABDOMEN AND PELVIS FINDINGS  Abdomen: Innumerable low-attenuation hepatic lesions are scattered throughout the hepatic parenchyma, compatible with widespread metastatic disease, with the largest lesion measuring up to 2.5 x 2.0 cm in the superior aspects of segments 7 and 8. The liver has a very nodular contour, which is favored to be related to diffuse metastatic disease (rather than pre-existing cirrhosis). In the head of the pancreas there is an aggressive appearing somewhat ill-defined enhancing soft tissue mass which is centrally hypoenhancing with avid peripheral enhancement, measuring approximately 6.0 x 4.5 cm, concerning for primary pancreatic neoplasm. This extends anteriorly where it is intimately associated with the undersurface of the duodenal bulb, and the medial surface of the second portion of the duodenum. This is not associated with significant pancreatic ductal dilatation at this time, nor is there evidence for common bile duct dilatation as the common bile duct and intrahepatic biliary tree are normal in caliber. Mildly enlarged peripancreatic lymph node adjacent to the third portion of the duodenum measuring 10 mm (image 73 of series 2). This pancreatic mass is intimately associated with and nearly completely encases the superior mesenteric vein, and this mass appears intimately associated with the gastroduodenal artery. At this time, this mass is in  contact with the superior mesenteric artery oevr approximately 30% of its circumference. This mass appears  separate from the portal vein which remains patent at this time.  The appearance of the gallbladder, spleen and bilateral adrenal glands is unremarkable. Kidneys are mildly atrophic bilaterally. Sub cm low-attenuation lesions in the kidneys bilaterally too small to definitively characterize. In addition there is a 1.6 cm low-attenuation lesion in the lateral aspect of the interpolar region of the left kidney, compatible with a small simple cyst. Atherosclerosis throughout the abdominal and pelvic vasculature, without evidence of aneurysm. There is a small stent in the infrarenal abdominal aorta which appears grossly patent. Within the visualized portions of the peritoneal cavity there is no significant volume of ascites, no pneumoperitoneum and no pathologic distention of small bowel.  Musculoskeletal: Orthopedic fixation hardware in the lumbar spine incompletely visualized. There are no aggressive appearing lytic or blastic lesions noted in the visualized portions of the skeleton.  IMPRESSION: 1. Today's study demonstrates progression of metastatic disease to the liver and ribs, as detailed above. It is uncertain whether or not this represents metastatic disease from lung cancer, or (more likely) represents new metastatic disease from a large mass in the head of the pancreas which is presumably a primary pancreatic malignancy. 2. Previously noted areas in the lungs appears similar to prior studies, favored to represent areas of post treatment related change. However, if the patient has undergone recent chemotherapy, the possibility of stable neoplasm is difficult to exclude. Continued attention on future examinations is recommended. 3. Additional findings similar to prior studies, as above.   Electronically Signed   By: Vinnie Langton M.D.   On: 11/11/2013 13:57   ASSESSMENT: 80 y.o. [name], [place] woman   (1) with a history of neuroendocrine lung cancer (small cell close(biopsied November of  2006, and treated with carboplatin and VP-16 between November of 2006 and January of 2007, followed by right lower lobe lobectomy March of 2007  (2) squamous cell carcinoma of the right lower lobe, T1 N0, resected at the same time as above  (3) non-small cell lung cancer of the right upper lobe diagnosed April of 2012, treated with stereotactic radiation (50 gray in 5 fractions) completed may of 2012  (4) metastatic neuroendocrine lung cancer (recurrence from #1 above) diagnosed by liver biopsy 09/05/2013  (a) started carboplatin/etoposide 10/12/2013, day 3 omitted cycle because of unrelated problems  (b)  treatment with carboplatin/etoposide resumed on 11/03/2013   PLAN:   Patient is doing well today.  Her labs are stable. She tolerated chemotherapy relatively well.  I am concerned about the right hand numbness and weakness in the right arm.  I ordered an MRI of the brain to be scheduled ASAP.  I did caution the patient and her daughter that if any of these symptoms worsen in any way that she should present to the emergency room via ambulance.  I gave her detailed instructions in her AVS about this also.  The patient will return next week, for labs, evaluation and chemotherapy.   She knows to call us in the interim for any questions or concerns.  We can certainly see her sooner if needed.  I spent 25 minutes counseling the patient face to face.  The total time spent in the appointment was 30 minutes.   Minette [place], [place] [phone] 12/05/2013 8:30 AM

## 2013-12-08 ENCOUNTER — Other Ambulatory Visit: Payer: Self-pay | Admitting: *Deleted

## 2013-12-08 ENCOUNTER — Telehealth: Payer: Self-pay | Admitting: *Deleted

## 2013-12-08 DIAGNOSIS — C3490 Malignant neoplasm of unspecified part of unspecified bronchus or lung: Secondary | ICD-10-CM

## 2013-12-08 NOTE — Telephone Encounter (Signed)
Patient was scheduled for Brain MRI for 12/17/2013, however, patient was symptomatic with right arm weakness and numbness at office visit on 12/03/2013 and needed MRI to be done sooner in the event that patient may have metastatic disease, this would change plans for chemo next week. [facility] was able to find a workin spot for Thursday 12/10/2013 @ 1100. We will also have patient come to [facility] before MRI to recheck labs. Plan called and discussed with daughter [name]. She is to call our office Thursday afternoon for results given this is a holiday weekend.

## 2013-12-10 ENCOUNTER — Emergency Department (HOSPITAL_COMMUNITY): Payer: Medicare Other

## 2013-12-10 ENCOUNTER — Other Ambulatory Visit (HOSPITAL_BASED_OUTPATIENT_CLINIC_OR_DEPARTMENT_OTHER): Payer: Medicare Other

## 2013-12-10 ENCOUNTER — Ambulatory Visit (HOSPITAL_COMMUNITY)
Admission: RE | Admit: 2013-12-10 | Discharge: 2013-12-10 | Disposition: A | Discharge status: Home / Self Care | Payer: Medicare Other | Source: Ambulatory Visit | Attending: Adult Health | Admitting: Adult Health

## 2013-12-10 ENCOUNTER — Encounter (HOSPITAL_COMMUNITY): Payer: Self-pay | Admitting: Emergency Medicine

## 2013-12-10 ENCOUNTER — Telehealth: Payer: Self-pay | Admitting: *Deleted

## 2013-12-10 ENCOUNTER — Emergency Department (HOSPITAL_COMMUNITY)
Admission: EM | Admit: 2013-12-10 | Discharge: 2013-12-11 | Disposition: A | Discharge status: Home / Self Care | Payer: Medicare Other | Attending: Emergency Medicine | Admitting: Emergency Medicine

## 2013-12-10 ENCOUNTER — Other Ambulatory Visit: Payer: Self-pay | Admitting: Adult Health

## 2013-12-10 DIAGNOSIS — I509 Heart failure, unspecified: Secondary | ICD-10-CM | POA: Insufficient documentation

## 2013-12-10 DIAGNOSIS — R209 Unspecified disturbances of skin sensation: Secondary | ICD-10-CM | POA: Insufficient documentation

## 2013-12-10 DIAGNOSIS — C3490 Malignant neoplasm of unspecified part of unspecified bronchus or lung: Secondary | ICD-10-CM

## 2013-12-10 DIAGNOSIS — Z923 Personal history of irradiation: Secondary | ICD-10-CM | POA: Insufficient documentation

## 2013-12-10 DIAGNOSIS — R5381 Other malaise: Secondary | ICD-10-CM | POA: Insufficient documentation

## 2013-12-10 DIAGNOSIS — R5383 Other fatigue: Secondary | ICD-10-CM

## 2013-12-10 DIAGNOSIS — R29898 Other symptoms and signs involving the musculoskeletal system: Secondary | ICD-10-CM

## 2013-12-10 DIAGNOSIS — I10 Essential (primary) hypertension: Secondary | ICD-10-CM | POA: Insufficient documentation

## 2013-12-10 DIAGNOSIS — C7A1 Malignant poorly differentiated neuroendocrine tumors: Secondary | ICD-10-CM

## 2013-12-10 DIAGNOSIS — E119 Type 2 diabetes mellitus without complications: Secondary | ICD-10-CM | POA: Insufficient documentation

## 2013-12-10 DIAGNOSIS — Z79899 Other long term (current) drug therapy: Secondary | ICD-10-CM | POA: Insufficient documentation

## 2013-12-10 DIAGNOSIS — Z5189 Encounter for other specified aftercare: Secondary | ICD-10-CM | POA: Insufficient documentation

## 2013-12-10 DIAGNOSIS — K219 Gastro-esophageal reflux disease without esophagitis: Secondary | ICD-10-CM | POA: Insufficient documentation

## 2013-12-10 DIAGNOSIS — Z87891 Personal history of nicotine dependence: Secondary | ICD-10-CM | POA: Insufficient documentation

## 2013-12-10 DIAGNOSIS — Z86711 Personal history of pulmonary embolism: Secondary | ICD-10-CM | POA: Insufficient documentation

## 2013-12-10 DIAGNOSIS — K5900 Constipation, unspecified: Secondary | ICD-10-CM | POA: Insufficient documentation

## 2013-12-10 DIAGNOSIS — Z9221 Personal history of antineoplastic chemotherapy: Secondary | ICD-10-CM | POA: Insufficient documentation

## 2013-12-10 DIAGNOSIS — I4891 Unspecified atrial fibrillation: Secondary | ICD-10-CM | POA: Insufficient documentation

## 2013-12-10 DIAGNOSIS — Z8673 Personal history of transient ischemic attack (TIA), and cerebral infarction without residual deficits: Secondary | ICD-10-CM | POA: Insufficient documentation

## 2013-12-10 DIAGNOSIS — I739 Peripheral vascular disease, unspecified: Secondary | ICD-10-CM | POA: Insufficient documentation

## 2013-12-10 LAB — CBC WITH DIFFERENTIAL/PLATELET
BASO%: 0.3 % (ref 0.0–2.0)
BASOS ABS: 0.1 10*3/uL (ref 0.0–0.1)
BASOS ABS: 0 10*3/uL (ref 0.0–0.1)
Basophils Relative: 0 % (ref 0–1)
EOS%: 0.3 % (ref 0.0–7.0)
Eosinophils Absolute: 0.1 10*3/uL (ref 0.0–0.5)
Eosinophils Absolute: 0 10*3/uL (ref 0.0–0.7)
Eosinophils Relative: 0 % (ref 0–5)
HCT: 35.4 % (ref 34.8–46.6)
HCT: 35.1 % — ABNORMAL LOW (ref 36.0–46.0)
HEMOGLOBIN: 11.4 g/dL — AB (ref 11.6–15.9)
Hemoglobin: 11.9 g/dL — ABNORMAL LOW (ref 12.0–15.0)
LYMPH%: 8 % — ABNORMAL LOW (ref 14.0–49.7)
Lymphocytes Relative: 7 % — ABNORMAL LOW (ref 12–46)
Lymphs Abs: 2.2 10*3/uL (ref 0.7–4.0)
MCH: 30.3 pg (ref 25.1–34.0)
MCH: 31.2 pg (ref 26.0–34.0)
MCHC: 32.2 g/dL (ref 31.5–36.0)
MCHC: 33.9 g/dL (ref 30.0–36.0)
MCV: 94.1 fL (ref 79.5–101.0)
MCV: 92.1 fL (ref 78.0–100.0)
MONO ABS: 2.2 10*3/uL — AB (ref 0.1–1.0)
MONO#: 1.9 10*3/uL — ABNORMAL HIGH (ref 0.1–0.9)
MONO%: 7.1 % (ref 0.0–14.0)
Monocytes Relative: 7 % (ref 3–12)
NEUT#: 22.5 10*3/uL — ABNORMAL HIGH (ref 1.5–6.5)
NEUT%: 84.3 % — AB (ref 38.4–76.8)
NEUTROS PCT: 86 % — AB (ref 43–77)
Neutro Abs: 27.6 10*3/uL — ABNORMAL HIGH (ref 1.7–7.7)
PLATELETS: 220 10*3/uL (ref 145–400)
PLATELETS: 224 10*3/uL (ref 150–400)
RBC: 3.76 10*6/uL (ref 3.70–5.45)
RBC: 3.81 MIL/uL — ABNORMAL LOW (ref 3.87–5.11)
RDW: 19.1 % — ABNORMAL HIGH (ref 11.2–14.5)
RDW: 18.1 % — ABNORMAL HIGH (ref 11.5–15.5)
WBC: 26.6 10*3/uL — ABNORMAL HIGH (ref 3.9–10.3)
WBC: 32 10*3/uL — ABNORMAL HIGH (ref 4.0–10.5)
lymph#: 2.1 10*3/uL (ref 0.9–3.3)

## 2013-12-10 LAB — COMPREHENSIVE METABOLIC PANEL
ALT: 10 U/L (ref 0–35)
AST: 34 U/L (ref 0–37)
Albumin: 3.9 g/dL (ref 3.5–5.2)
Alkaline Phosphatase: 68 U/L (ref 39–117)
Anion gap: 19 — ABNORMAL HIGH (ref 5–15)
BUN: 21 mg/dL (ref 6–23)
CHLORIDE: 85 meq/L — AB (ref 96–112)
CO2: 25 meq/L (ref 19–32)
Calcium: 10 mg/dL (ref 8.4–10.5)
Creatinine, Ser: 1.99 mg/dL — ABNORMAL HIGH (ref 0.50–1.10)
GFR, EST AFRICAN AMERICAN: 26 mL/min — AB (ref >90)
GFR, EST NON AFRICAN AMERICAN: 23 mL/min — AB (ref >90)
GLUCOSE: 131 mg/dL — AB (ref 70–99)
Potassium: 4.1 mEq/L (ref 3.7–5.3)
SODIUM: 129 meq/L — AB (ref 137–147)
Total Bilirubin: 0.3 mg/dL (ref 0.3–1.2)
Total Protein: 7.6 g/dL (ref 6.0–8.3)

## 2013-12-10 LAB — COMPREHENSIVE METABOLIC PANEL (CC13)
ALK PHOS: 63 U/L (ref 40–150)
ALT: 11 U/L (ref 0–55)
AST: 32 U/L (ref 5–34)
Albumin: 3.7 g/dL (ref 3.5–5.0)
Anion Gap: 14 mEq/L — ABNORMAL HIGH (ref 3–11)
BUN: 20.1 mg/dL (ref 7.0–26.0)
CALCIUM: 10 mg/dL (ref 8.4–10.4)
CO2: 23 mEq/L (ref 22–29)
CREATININE: 1.9 mg/dL — AB (ref 0.6–1.1)
Chloride: 91 mEq/L — ABNORMAL LOW (ref 98–109)
GLUCOSE: 139 mg/dL (ref 70–140)
Potassium: 3.8 mEq/L (ref 3.5–5.1)
Sodium: 128 mEq/L — ABNORMAL LOW (ref 136–145)
Total Bilirubin: 0.37 mg/dL (ref 0.20–1.20)
Total Protein: 7.3 g/dL (ref 6.4–8.3)

## 2013-12-10 LAB — LIPASE, BLOOD: Lipase: 62 U/L — ABNORMAL HIGH (ref 11–59)

## 2013-12-10 LAB — I-STAT TROPONIN, ED: Troponin i, poc: 0.02 ng/mL (ref 0.00–0.08)

## 2013-12-10 LAB — I-STAT CG4 LACTIC ACID, ED: Lactic Acid, Venous: 1.37 mmol/L (ref 0.5–2.2)

## 2013-12-10 LAB — POC OCCULT BLOOD, ED: Fecal Occult Bld: NEGATIVE

## 2013-12-10 MED ORDER — MORPHINE SULFATE 4 MG/ML IJ SOLN
4.0000 mg | Freq: Once | INTRAMUSCULAR | Status: AC
  Administered 2013-12-10: 4 mg via INTRAVENOUS
  Filled 2013-12-10: qty 1

## 2013-12-10 MED ORDER — SODIUM CHLORIDE 0.9 % IV SOLN
1000.0000 mL | INTRAVENOUS | Status: DC
  Administered 2013-12-10: 1000 mL via INTRAVENOUS

## 2013-12-10 MED ORDER — SODIUM CHLORIDE 0.9 % IV SOLN
1000.0000 mL | Freq: Once | INTRAVENOUS | Status: AC
  Administered 2013-12-10: 1000 mL via INTRAVENOUS

## 2013-12-10 MED ORDER — IOHEXOL 300 MG/ML  SOLN
25.0000 mL | Freq: Once | INTRAMUSCULAR | Status: AC | PRN
  Administered 2013-12-10: 25 mL via ORAL

## 2013-12-10 MED ORDER — ONDANSETRON HCL 4 MG/2ML IJ SOLN
4.0000 mg | Freq: Once | INTRAMUSCULAR | Status: AC
  Administered 2013-12-10: 4 mg via INTRAVENOUS
  Filled 2013-12-10: qty 2

## 2013-12-10 NOTE — ED Provider Notes (Signed)
CSN: [id number]     Arrival date & time 12/10/13  2007 History   First MD Initiated Contact with Patient 12/10/13 2025     Chief Complaint  Patient presents with  . Constipation     (Consider location/radiation/quality/duration/timing/severity/associated sxs/prior Treatment) Patient is a 80 y.o. female presenting with constipation.  Constipation Severity:  Severe Time since last bowel movement:  1 week Timing:  Constant Progression:  Worsening Chronicity:  Recurrent Stool description:  Hard Relieved by:  Nothing Associated symptoms: no abdominal pain, no back pain, no diarrhea, no dysuria, no fever, no flatus, no nausea and no vomiting     Past Medical History  Diagnosis Date  . Cancer     lung ca, RUL  . CHF (congestive heart failure)   . Hypertension   . Hx pulmonary embolism 03/2011    on coumadin till 02/2011,stopped  due to gi bleed  . GERD (gastroesophageal reflux disease)   . GI bleed   . Colitis     history  . Chest pain 06/30/11    hx blood clot in lung  . Bell's palsy   . PVD (peripheral vascular disease)   . Lung cancer     metachronous stage I nscca RUL  . History of radiation therapy 5/8/10th,14th,16th,and 10/27/2010    RUL lung nscca  . Blood transfusion     during lumbar surgery  . History of chemotherapy 2006-207    carboplatin/VP16 last January 207  . Clotting disorder     DVT/ PE  . Lung cancer 04/01/12    biopsy-lingular nodule-inv carcinoma, minor squamous cell ca  . History of radiation therapy 11/12, 11/14, 04/29/2012    lingular -squamous cell carcinoma, nodule  . Paroxysmal atrial fibrillation   . On continuous oral anticoagulation   . Diabetes mellitus without complication     patient states "diet controlled."   Past Surgical History  Procedure Laterality Date  . Right lower lobe lung resection  2007  . Cataract surgery      b/l  lens implants  . Abdominal hysterectomy      partial age 44  . Shoulder open rotator cuff repair   04/2003    left  . Appendectomy    . Carpal tunnel release    . Bladder surgery    . Lumbar spine surgery      x3, L4-S1  . Right lung biopsy  2006  . Coronary angioplasty with stent placement  2001    renal stent  . Colonoscopies  2011    polyp removal   . Colonoscopy N/A 10/16/2013    Procedure: COLONOSCOPY;  Surgeon: Beryle Beams, MD;  Location: [facility];  Service: Endoscopy;  Laterality: N/A;   Family History  Problem Relation Age of Onset  . Breast cancer Mother   . Uterine cancer Sister   . Lung cancer Sister   . Breast cancer Maternal Grandmother   . Colon cancer Sister   . Uterine cancer Sister    History  Substance Use Topics  . Smoking status: Former Smoker -- 2.00 packs/day for 54 years    Types: Cigarettes    Quit date: 12/01/2003  . Smokeless tobacco: Never Used  . Alcohol Use: No   OB History   Grav Para Term Preterm Abortions TAB SAB Ect Mult Living                 Review of Systems  Constitutional: Negative for fever and chills.  HENT: Negative for sore throat.  Eyes: Negative for pain.  Respiratory: Negative for cough and shortness of breath.   Cardiovascular: Negative for chest pain.  Gastrointestinal: Positive for constipation. Negative for nausea, vomiting, abdominal pain, diarrhea and flatus.  Genitourinary: Negative for dysuria.  Musculoskeletal: Negative for back pain.  Skin: Negative for rash.  Neurological: Negative for numbness and headaches.      Allergies  Aspirin and Tape  Home Medications   Prior to Admission medications   Medication Sig Start Date End Date Taking? Authorizing Provider  acetaminophen (TYLENOL) 650 MG CR tablet Take 650 mg by mouth every 8 (eight) hours as needed. For pain   Yes Historical Provider, MD  alendronate (FOSAMAX) 70 MG tablet Take 70 mg by mouth every 7 (seven) days. Take with a full glass of water on an empty stomach. On Tuesdays   Yes Historical Provider, MD  Alum & Mag Hydroxide-Simeth  (MAGIC MOUTHWASH W/LIDOCAINE) SOLN Take 5 mLs by mouth 4 (four) times daily as needed for mouth pain. Swish and spit 11/03/13  Yes Amy G Berry, PA-C  Cyanocobalamin (B-12 PO) Take 1 tablet by mouth daily.   Yes Historical Provider, MD  diphenhydramine-acetaminophen (TYLENOL PM) 25-500 MG TABS Take 1-2 tablets by mouth at bedtime as needed (pain/sleep). For sleeplessness   Yes Historical Provider, MD  fenofibrate (TRICOR) 145 MG tablet Take 145 mg by mouth every morning.    Yes Historical Provider, MD  Garlic 2 MG CAPS Take 1 capsule by mouth daily.   Yes Historical Provider, MD  Glucosamine-Chondroitin (GLUCOSAMINE CHONDR COMPLEX PO) Take 1 tablet by mouth daily.   Yes Historical Provider, MD  guaiFENesin (ROBITUSSIN) 100 MG/5ML liquid Take 200 mg by mouth 3 (three) times daily as needed for cough.   Yes Historical Provider, MD  hydrocortisone (ANUSOL-HC) 2.5 % rectal cream Place rectally 4 (four) times daily.  11/23/13  Yes Lennis Marion Downer, MD  lidocaine-prilocaine (EMLA) cream Apply 1 application topically as needed. 10/12/13  Yes Chauncey Cruel, MD  metoprolol tartrate (LOPRESSOR) 25 MG tablet Take 25 mg by mouth every morning.    Yes Historical Provider, MD  Omega-3 Fatty Acids (OMEGA-3 FISH OIL PO) Take 1 capsule by mouth daily.   Yes Historical Provider, MD  ondansetron (ZOFRAN) 8 MG tablet Take 1 tablet every 8 hours as needed for nausea. 11/23/13  Yes Lennis Marion Downer, MD  oxyCODONE-acetaminophen (PERCOCET) 5-325 MG per tablet Take 1 tablet by mouth every 4 (four) hours as needed. For pain   Yes Historical Provider, MD  pantoprazole (PROTONIX) 40 MG tablet Take 1 tablet (40 mg total) by mouth 2 (two) times daily. 10/17/13  Yes Allie Bossier, MD  prochlorperazine (COMPAZINE) 10 MG tablet Take 1 tablet (10 mg total) by mouth every 6 (six) hours as needed for nausea or vomiting. 11/23/13  Yes Lennis Marion Downer, MD  rOPINIRole (REQUIP) 0.25 MG tablet Take 0.25 mg by mouth at bedtime.   Yes Historical  Provider, MD  sennosides-docusate sodium (SENOKOT-S) 8.6-50 MG tablet Take 2 tablets by mouth 3 (three) times daily as needed. For constipation as needed   Yes Historical Provider, MD  simvastatin (ZOCOR) 80 MG tablet Take 80 mg by mouth at bedtime.    Yes Historical Provider, MD  tiotropium (SPIRIVA) 18 MCG inhalation capsule Place 18 mcg into inhaler and inhale daily.    Yes Historical Provider, MD  vitamin C (ASCORBIC ACID) 500 MG tablet Take 500 mg by mouth daily.   Yes Historical Provider, MD  polyethylene glycol (GOLYTELY)  236 G solution Take 4,000 mLs by mouth once. 12/11/13   Freddi Che, MD   BP 112/63  Pulse 86  Temp(Src) 98.8 F (37.1 C) (Oral)  Resp 18  SpO2 95% Physical Exam  Constitutional: She is oriented to person, place, and time. She appears well-developed and well-nourished. No distress.  HENT:  Head: Normocephalic and atraumatic.  Eyes: Pupils are equal, round, and reactive to light. Right eye exhibits no discharge. Left eye exhibits no discharge.  Neck: Normal range of motion.  Cardiovascular: Normal rate, regular rhythm and normal heart sounds.   Pulmonary/Chest: Effort normal and breath sounds normal.  Abdominal: Soft. She exhibits distension. There is no tenderness. There is no rigidity, no guarding, no tenderness at McBurney's point and negative Murphy's sign.  Musculoskeletal: Normal range of motion.  Neurological: She is alert and oriented to person, place, and time.  Skin: Skin is warm. She is not diaphoretic.    ED Course  Procedures (including critical care time) Labs Review Labs Reviewed  COMPREHENSIVE METABOLIC PANEL - Abnormal; Notable for the following:    Sodium 129 (*)    Chloride 85 (*)    Glucose, Bld 131 (*)    Creatinine, Ser 1.99 (*)    GFR calc non Af Amer 23 (*)    GFR calc Af Amer 26 (*)    Anion gap 19 (*)    All other components within normal limits  CBC WITH DIFFERENTIAL - Abnormal; Notable for the following:    WBC 32.0 (*)     RBC 3.81 (*)    Hemoglobin 11.9 (*)    HCT 35.1 (*)    RDW 18.1 (*)    Neutrophils Relative % 86 (*)    Lymphocytes Relative 7 (*)    Neutro Abs 27.6 (*)    Monocytes Absolute 2.2 (*)    All other components within normal limits  LIPASE, BLOOD - Abnormal; Notable for the following:    Lipase 62 (*)    All other components within normal limits  LACTIC ACID, PLASMA  I-STAT CG4 LACTIC ACID, ED  I-STAT TROPOININ, ED  POC OCCULT BLOOD, ED    Imaging Review Ct Abdomen Pelvis Wo Contrast  12/11/2013   CLINICAL DATA:  Abdominal pain  EXAM: CT ABDOMEN AND PELVIS WITHOUT CONTRAST  TECHNIQUE: Multidetector CT imaging of the abdomen and pelvis was performed following the standard protocol without IV contrast.  COMPARISON:  10/14/2013  FINDINGS: BODY WALL: Unremarkable.  LOWER CHEST: Extensive coronary atherosclerosis. Bulky mitral and aortic valve annulus calcification. Emphysematous changes at both lung base use.  ABDOMEN/PELVIS:  Liver: Numerous ill-defined low-density masses compatible with metastatic disease. No indication of hemorrhage.  Biliary: No evidence of biliary obstruction or stone.  Pancreas: Lobulated ill-defined mass in the head of the pancreas measuring up to 5 cm. There is neighboring peripancreatic lymphadenopathy. Margins were better established on preceding enhanced CT. No evidence of acute pancreatitis.  Spleen: Unremarkable.  Adrenals: Mild thickening of the body of the left adrenal gland which is stable from priors.  Kidneys and ureters: No hydronephrosis. Hilar calcifications are likely atherosclerotic.  Bladder: Moderate distention.  Reproductive: Hysterectomy.  The ovaries are symmetric.  Bowel: There is diffuse distention and fluid filling of the colon. This is above the a large focus of desiccated stool distending the rectum to 6 cm. There is new presacral and mesorectal fat infiltration. No small bowel obstruction. No pericecal inflammation.  Peritoneum: No ascites or  pneumoperitoneum.  Vascular: Extensive aortic and branch vessel  atherosclerosis. There is a stent within the lower aorta.  OSSEOUS: No acute abnormalities. L4-5 and L5-S1 posterior fixation. There is questionable bone fusion across at the L4-5 low.  IMPRESSION: 1. Rectal stool impaction with stercoral colitis. The are obstructive changes in the more proximal colon. 2. Intra-abdominal metastatic disease which has been recently staged (12/08/2013).   Electronically Signed   By: Jorje Guild M.D.   On: 12/11/2013 00:41   Mr Brain Wo Contrast  12/10/2013   CLINICAL DATA:  80 year old female with right upper extremity numbness, weakness. Lung cancer with extracranial progression demonstrated in June. Subsequent encounter.  Renal insufficiency precludes IV contrast administration today.  EXAM: MRI HEAD WITHOUT CONTRAST  TECHNIQUE: Multiplanar, multiecho pulse sequences of the brain and surrounding structures were obtained without intravenous contrast.  COMPARISON:  Brain MRI 10/04/2010.  FINDINGS: Stable cerebral volume. Major intracranial vascular flow voids are stable.  No restricted diffusion to suggest acute infarction. No midline shift, mass effect, evidence of mass lesion, ventriculomegaly, extra-axial collection or acute intracranial hemorrhage. Cervicomedullary junction and pituitary are within normal limits. Grossly negative visualized cervical spine. Bone marrow signal is stable and within normal limits.  Scattered cerebral white matter T2 and FLAIR hyperintensity without mass effect or confluent signal abnormality to suggest vasogenic edema, has mildly progressed since 2012. No cortical encephalomalacia. Chronic lacunar infarct in the left paracentral pons is new since 2012. Deep gray matter nuclei are stable. Cerebellum Stable and within normal limits.  Visible internal auditory structures appear normal. Trace right mastoid effusion is unchanged. Chronic fluid in bubbly opacity in both sphenoid sinuses.  Stable orbits soft tissues. Visualized scalp soft tissues are within normal limits. Small T2 hyperintense superficial lobe left parotid nodule is stable and has a benign appearance.  IMPRESSION: 1. No acute or metastatic intracranial abnormality identified in the absence of IV contrast. 2. Chronic left brainstem lacunar infarct, new since 2012. 3. Mildly progressed nonspecific white matter signal changes elsewhere, most commonly due to chronic small vessel disease.   Electronically Signed   By: Lars Pinks M.D.   On: 12/10/2013 12:36     EKG Interpretation None      MDM   Final diagnoses:  Constipation, unspecified constipation type   81 yo F with hx as above presents with constipation x1 week.   On arrival, HDS, in NAD. Abdomen minimally distended, non-tender. Will obtain labs / imaging.   Labs with leukocytosis, but no fevers, no evidence of infection. CT with evidence of moderate stool burden. Patient had large BM prior to CT scan. Patient with stool impaction. Will d/c with Golytely. Patient discharged in stable condition. Strict return precautions given. Discharged in stable condition.    Freddi Che, MD 12/11/13 [phone]

## 2013-12-10 NOTE — Telephone Encounter (Signed)
This RN called pt's caregiver [name] per her call requesting results of MRI of brain.  Informed [name] of no sign of cancer.  Per conversation Adela states " I just found out ...well 2 days ago...that Latora has not had a BM for over a week "  " I have given her 2 bottles of sodium something- can't remember the name but I got it at the store " " she is just having smears of stool " " her stomach is distended and her hemorrhoids are bulging out".  Adela states " if she doesn't have a BM in 30 minutes I am going to have to call 911"  This RN informed her to proceed with the above due to concern for impacted stools and need for additional work up at the ER.  Adela verbalized understanding.

## 2013-12-10 NOTE — ED Notes (Signed)
Present with over one week of constipation and hemorrhoid pain. Taken senna, miralax, prune juice and magcitrate with no relief. Abdomen distended, nontender, + bowel sounds. Denies nasuea and vomiting.  Last BM over a week ago.

## 2013-12-10 NOTE — ED Provider Notes (Signed)
80 year old female comes in because of abdominal distention and pain and constipation. She has not had a bowel movement in approximately 10 days. She's taken a variety of laxatives at home with no relief. There's been mild associated nausea. On exam, there is mild abdominal distention and abdomen is mildly tender across the lower abdomen but there is no rebound or guarding. She did have a small bowel movement after arrival in the ED. The baby she is markedly elevated at 32,000, but lactic acid is normal. Lipase is mildly elevated but appears to be chronically elevated. Review of old records shows that she is getting chemotherapy for neuroendocrine malignancy but I do not see any evidence of her receiving Neupogen and other similar medications. CT has been ordered to evaluate her abdominal pain.  I saw and evaluated the patient, reviewed the resident's note and I agree with the findings and plan.    Delora Fuel, MD 38/18/29 [id number]

## 2013-12-11 MED ORDER — PEG 3350-KCL-NABCB-NACL-NASULF 236 G PO SOLR: 4000.0000 mL | Freq: Once | ORAL | Status: AC

## 2013-12-11 NOTE — ED Notes (Signed)
Discharge instructions reviewed with pt and family. Pt verbalized understanding.

## 2013-12-11 NOTE — Discharge Instructions (Signed)

## 2013-12-13 ENCOUNTER — Other Ambulatory Visit: Payer: Self-pay | Admitting: Oncology

## 2013-12-14 ENCOUNTER — Ambulatory Visit (HOSPITAL_BASED_OUTPATIENT_CLINIC_OR_DEPARTMENT_OTHER): Payer: Medicare Other | Admitting: Oncology

## 2013-12-14 ENCOUNTER — Ambulatory Visit (HOSPITAL_BASED_OUTPATIENT_CLINIC_OR_DEPARTMENT_OTHER): Payer: Medicare Other

## 2013-12-14 ENCOUNTER — Ambulatory Visit: Payer: Medicare Other

## 2013-12-14 ENCOUNTER — Telehealth: Payer: Self-pay

## 2013-12-14 ENCOUNTER — Other Ambulatory Visit: Payer: Medicare Other

## 2013-12-14 ENCOUNTER — Encounter: Payer: Self-pay | Admitting: Oncology

## 2013-12-14 VITALS — BP 160/71 | HR 80 | Temp 97.8°F | Resp 20 | Ht 65.0 in | Wt 199.1 lb

## 2013-12-14 DIAGNOSIS — C7A1 Malignant poorly differentiated neuroendocrine tumors: Secondary | ICD-10-CM

## 2013-12-14 DIAGNOSIS — I4891 Unspecified atrial fibrillation: Secondary | ICD-10-CM

## 2013-12-14 DIAGNOSIS — C787 Secondary malignant neoplasm of liver and intrahepatic bile duct: Secondary | ICD-10-CM

## 2013-12-14 DIAGNOSIS — Z86711 Personal history of pulmonary embolism: Secondary | ICD-10-CM

## 2013-12-14 DIAGNOSIS — C3490 Malignant neoplasm of unspecified part of unspecified bronchus or lung: Secondary | ICD-10-CM

## 2013-12-14 DIAGNOSIS — J984 Other disorders of lung: Secondary | ICD-10-CM

## 2013-12-14 DIAGNOSIS — C7B8 Other secondary neuroendocrine tumors: Secondary | ICD-10-CM

## 2013-12-14 LAB — CBC WITH DIFFERENTIAL/PLATELET
BASO%: 0.5 % (ref 0.0–2.0)
Basophils Absolute: 0.1 10*3/uL (ref 0.0–0.1)
EOS ABS: 0 10*3/uL (ref 0.0–0.5)
EOS%: 0.5 % (ref 0.0–7.0)
HEMATOCRIT: 31.1 % — AB (ref 34.8–46.6)
HEMOGLOBIN: 10.3 g/dL — AB (ref 11.6–15.9)
LYMPH%: 14.8 % (ref 14.0–49.7)
MCH: 31.6 pg (ref 25.1–34.0)
MCHC: 33.3 g/dL (ref 31.5–36.0)
MCV: 94.8 fL (ref 79.5–101.0)
MONO#: 1.3 10*3/uL — ABNORMAL HIGH (ref 0.1–0.9)
MONO%: 12.9 % (ref 0.0–14.0)
NEUT%: 71.3 % (ref 38.4–76.8)
NEUTROS ABS: 7.4 10*3/uL — AB (ref 1.5–6.5)
Platelets: 181 10*3/uL (ref 145–400)
RBC: 3.28 10*6/uL — ABNORMAL LOW (ref 3.70–5.45)
RDW: 20.1 % — AB (ref 11.2–14.5)
WBC: 10.4 10*3/uL — ABNORMAL HIGH (ref 3.9–10.3)
lymph#: 1.5 10*3/uL (ref 0.9–3.3)

## 2013-12-14 LAB — COMPREHENSIVE METABOLIC PANEL (CC13)
ALBUMIN: 3.1 g/dL — AB (ref 3.5–5.0)
ALT: 10 U/L (ref 0–55)
AST: 31 U/L (ref 5–34)
Alkaline Phosphatase: 49 U/L (ref 40–150)
Anion Gap: 9 mEq/L (ref 3–11)
BUN: 18.1 mg/dL (ref 7.0–26.0)
CALCIUM: 9.1 mg/dL (ref 8.4–10.4)
CHLORIDE: 99 meq/L (ref 98–109)
CO2: 29 mEq/L (ref 22–29)
Creatinine: 1.5 mg/dL — ABNORMAL HIGH (ref 0.6–1.1)
Glucose: 122 mg/dl (ref 70–140)
POTASSIUM: 3 meq/L — AB (ref 3.5–5.1)
Sodium: 137 mEq/L (ref 136–145)
Total Bilirubin: 0.26 mg/dL (ref 0.20–1.20)
Total Protein: 6.4 g/dL (ref 6.4–8.3)

## 2013-12-14 NOTE — Telephone Encounter (Signed)
Faxed demographic info, med list, recent office notes , imaging, and labs to [place] in admitting at [place].

## 2013-12-14 NOTE — Progress Notes (Signed)
OFFICE PROGRESS NOTE   12/14/2013   Physicians: G.Magrinat, M.Tammi Klippel, (D.Murinson, K.Khan), P.Hung   INTERVAL HISTORY:  Patient is seen, together with daughter, in continuing attention to palliative chemotherapy in process for metastatic small cell carcinoma of lung, with history of 4 primary lung cancers between 2006 and 2013. Although she was due to begin cycle 4 carboplatin and VP16 today, she had ED evaluation with CT AP on 12-10-13 that shows noimprovement in liver involvement despite chemo used to date.; ED visit was due to constipation, with no BM in 10 days then, and she additionally had MRI head with no acute metastatic disease.  Patient and daughter also tell me that she continues to do poorly and is feeling badly overall today. After all discussion, we have discontinued chemotherapy and will make referral to [facility].  Note creatinine in ED 12-10-13 up to 1.99, previously 1.3.  She has PAC, last flushed at this office on 11-25-13 and may have been used in ED on 12-10-13.  ONCOLOGIC HISTORY 1. 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  2. Squamous cell carcinoma of the right lower lobe T1 resected at the time of surgery on 08/09/2005. All resected lymph nodes were  negative.  3. Non small cell lung cancer of the right upper lobe detected in March 2012, +NABx 09/21/10, with positive PET scan treated with stereotactic radiation 5000 cGy in 5 fractions from 10/17/2010 through 10/27/2010.  4. Lingular nodule which appears to be increasing in size. This esion may have been present on a CT scan of the chest going back  to 08/30/2010. A PET scan carried out on 03/14/2012 showed no evidence of metastatic disease. Core needle biopsy of the lingular nodule  was carried out on 04/01/2012 and showed invasive poorly differentiated carcinoma with predominant small cell carcinoma component and minor squamous cell component. Stains for p63, chromogranin and synaptophysin were diffusely positive. Cytokeratin 5/6 was positive in the squamous cell carcinoma component, but negative in the rest of the tumor. The small cell component comprised about 90% of the tumor. The lesion was treated with stereotactic body radiotherapy (SBRT),  administered on 04/22/2012, 04/24/2012, and 04/29/2012, for a total of 54 Gy. The patient recieved carboplatin and VP-16 x4 cycles from  05/19/2012  Thru 07/31/2012.  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 Patient desired further treatment. Carboplatin etoposide was begun 10-12-2013, day 3 held due to hospitalization for GI bleed related to hemorrhoids. She completed cycle 3 on 11-23-13, however CT AP 12-11-13 did not show improvement in extensive hepatic involvement and other metastatic disease.  Review of systems as above, also: Chronic SOB despite continuous O2, unchanged. Cough at baseline, not productive. No particular pain. Generally weak. Bowels moved after [place] from ED and have moved well again today. Eating some and able to drink fluids. No bleeding other than small amount from rectum after severe constipation resolved. Did sleep last pm. Remainder of 10 point Review of Systems negative.  Objective:  Vital signs in last 24 hours:  BP 160/71  Pulse 80  Temp(Src) 97.8 F (36.6 C) (Oral)  Resp 20  Ht 5\' 5"  (1.651 m)  Wt 199 lb 1.6 oz (90.311 kg)  BMI 33.13 kg/m2 weight is up 1 lb.  Elderly, chronically ill appearing lady in wheelchair on [place] O2, a little drowsy but appropriate verbal responses. Daughter very supportive and tearful at times during visit. Pale, not cyanotic. Alopecia  HEENT:PERRL, sclerae not  icteric. Oral mucosa moist without lesions, posterior pharynx clear.  Neck supple. No JVD upright. Lymphatics:no cervical,suraclavicular  adenopathy Resp: diminished BS thruout otherwise clear to auscultation bilaterally. No use of accessory muscles  Cardio: regular rate and rhythm. No gallop. GI: abdomen soft, nontender, not distended, no appreciable mass or organomegaly, no rub over liver. Few bowel sounds. Wearing depends Musculoskeletal/ Extremities: without pitting edema, cords, tenderness Neuro: speech fluent and appropriate. Hard of hearing. Moves all extremities. Skin dry without rash, ecchymosis, petechiae Portacath-without erythema or tenderness  Lab Results:  CBC today with WBC 10.4, ANC 7.4, Hgb 10.3, plt 181 CMET available after visit: Na 137, K 3.0, creatinine 1.5, alb 3.1, LFTs ok, calcium 9.1  Studies/Results: CT ABDOMEN AND PELVIS WITHOUT CONTRAST  12-11-13 COMPARISON: 10/14/2013  FINDINGS:  BODY WALL: Unremarkable.  LOWER CHEST: Extensive coronary atherosclerosis. Bulky mitral and  aortic valve annulus calcification. Emphysematous changes at both  lung base use.  ABDOMEN/PELVIS:  Liver: Numerous ill-defined low-density masses compatible with  metastatic disease. No indication of hemorrhage.  Biliary: No evidence of biliary obstruction or stone.  Pancreas: Lobulated ill-defined mass in the head of the pancreas  measuring up to 5 cm. There is neighboring peripancreatic  lymphadenopathy. Margins were better established on preceding  enhanced CT. No evidence of acute pancreatitis.  Spleen: Unremarkable.  Adrenals: Mild thickening of the body of the left adrenal gland  which is stable from priors.  Kidneys and ureters: No hydronephrosis. Hilar calcifications are  likely atherosclerotic.  Bladder: Moderate distention.  Reproductive: Hysterectomy. The ovaries are symmetric.  Bowel: There is diffuse distention and fluid filling of the colon.  This is above the a large  focus of desiccated stool distending the  rectum to 6 cm. There is new presacral and mesorectal fat  infiltration. No small bowel obstruction. No pericecal inflammation.  Peritoneum: No ascites or pneumoperitoneum.  Vascular: Extensive aortic and branch vessel atherosclerosis. There  is a stent within the lower aorta.  OSSEOUS: No acute abnormalities. L4-5 and L5-S1 posterior fixation.  There is questionable bone fusion across at the L4-5 low.  IMPRESSION:  1. Rectal stool impaction with stercoral colitis. The are  obstructive changes in the more proximal colon.  2. Intra-abdominal metastatic disease which has been recently staged  (12/08/2013).    MRI HEAD WITHOUT CONTRAST 12-10-13   COMPARISON: Brain MRI 10/04/2010.  FINDINGS:  Stable cerebral volume. Major intracranial vascular flow voids are  stable.  No restricted diffusion to suggest acute infarction. No midline  shift, mass effect, evidence of mass lesion, ventriculomegaly,  extra-axial collection or acute intracranial hemorrhage.  Cervicomedullary junction and pituitary are within normal limits.  Grossly negative visualized cervical spine. Bone marrow signal is  stable and within normal limits.  Scattered cerebral white matter T2 and FLAIR hyperintensity without  mass effect or confluent signal abnormality to suggest vasogenic  edema, has mildly progressed since 2012. No cortical  encephalomalacia. Chronic lacunar infarct in the left paracentral  pons is new since 2012. Deep gray matter nuclei are stable.  Cerebellum Stable and within normal limits.  Visible internal auditory structures appear normal. Trace right  mastoid effusion is unchanged. Chronic fluid in bubbly opacity in  both sphenoid sinuses. Stable orbits soft tissues. Visualized scalp  soft tissues are within normal limits. Small T2  hyperintense  superficial lobe left parotid nodule is stable and has a benign  appearance.  IMPRESSION:  1. No acute or  metastatic intracranial abnormality identified in the  absence of IV contrast.  2. Chronic left brainstem lacunar infarct, new since 2012.  3. Mildly progressed nonspecific white matter signal changes  elsewhere, most commonly due to chronic small vessel disease.      Medications: I have reviewed the patient's current medications. She will increase senokot S to 2 tablets twice daily.  DISCUSSION:Prior to visit, radiologist and I have reviewed most recent CT AP with priors, particularly as report did not go into details comparing with baseline CT of 10-11-13 or interim CT of 11-11-13. Per our review now of most recent noncontrast scan with prior scans with contrast, at least a couple areas in liver are larger and remainder are at least stable/ not improved. I have also discussed case with Dr Jana Hakim, as he had seen patient initially during changing of MDs, and he is in agreement with changing to strictly palliative care. I have discussed results of the recent CT with patient and daughter together now, explaining that the chemotherapy is not giving any benefit and recommending that we ask Hospice to assist now. Somewhat to my surprise, they are both very open to stopping chemo and to Hospice referral. Daughter tells me that her mother initially cared for her during her own cancer several years ago; daughter does not want her mother in any facility where she cannot stay with her.  She has Advance Directives completed. Referral made to [facility]. They understand that Hospice will stay in close communication with this office and that we are glad to see the patient back at any time if needed, tho we will not schedule regular follow up visits. All of their questions have been answered and they are in agreement with plan.  Assessment/Plan: 1. metastatic small cell carcinoma of lung involving liver, head of pancreas and possibly ribs, in 80 yo lady with history of mixed small cell/NSC in 2006, [place] in  2012 and small cell 2013, treated with chemotherapy and stereotactic radiation previously, but no improvement with 3 cycles of carboplatin etoposide from 10-12-13 thru 11-23-13. Chemotherapy discontinued, referral made to [facility]. 2.HCPOA is daughter [name] and patient does not want to be on machines or life support in case of terminal event  3.PAC in, which will need to be flushed every 6-8 weeks if not otherwise used. Hospice assistance will be useful with this also. 4.significant rectal bleeding from hemorrhoids in 10-2013, and severe constipation for which she was seen in ED last week. She will increase Senokot S to 2 tablets twice daily and Hospice will continue to monitor. 5.Chronic lung disease on home O2 continuous. >100 pack year smoking, DC 2005.  6.CHF, paroxysmal afib, previous PE (anticoagulation DCd with GI bleed 2012), peripheral vascular disease, hx diet controlled DM 7.chronic kidney disease and mild hypokalemia today: related to other illnesses. Given overall situation, these do not require additional evaluation.   TIme spent 25 min including >50% counseling and coordination of care.    Sadhana Frater P, MD   12/14/2013, 3:29 PM

## 2013-12-15 ENCOUNTER — Telehealth: Payer: Self-pay

## 2013-12-15 ENCOUNTER — Telehealth: Payer: Self-pay | Admitting: Oncology

## 2013-12-15 ENCOUNTER — Ambulatory Visit: Payer: Medicare Other

## 2013-12-15 ENCOUNTER — Other Ambulatory Visit: Payer: Medicare Other

## 2013-12-15 NOTE — Telephone Encounter (Signed)
, °

## 2013-12-15 NOTE — Telephone Encounter (Signed)
Message copied by Baruch Merl on Tue Dec 15, 2013 10:34 AM ------      Message from: Gordy Levan      Created: Mon Dec 14, 2013  8:32 PM       Please fax my note from 7-6 to [facility]. I believe contact information is in phone note from [place] 7-6.      Thanks      Cc LA, TH ------

## 2013-12-15 NOTE — Telephone Encounter (Signed)
Faxed office note to [facility] as noted below by Dr. Marko Plume.

## 2013-12-16 ENCOUNTER — Other Ambulatory Visit: Payer: Medicare Other

## 2013-12-16 ENCOUNTER — Ambulatory Visit: Payer: Medicare Other

## 2013-12-17 ENCOUNTER — Ambulatory Visit (HOSPITAL_COMMUNITY): Payer: Medicare Other

## 2013-12-17 ENCOUNTER — Other Ambulatory Visit: Payer: Medicare Other

## 2013-12-18 ENCOUNTER — Ambulatory Visit: Payer: Medicare Other

## 2013-12-25 ENCOUNTER — Telehealth: Payer: Self-pay

## 2013-12-25 NOTE — Telephone Encounter (Signed)
Left a message for [name]  at [phone] stating that [employer] PA with Dr. Carolanne Grumbling can dose Ms. [name] coumadin as Dr. Marko Plume did not know she was on coumadin.

## 2013-12-25 NOTE — Telephone Encounter (Signed)
Received a PT/Inr lab result on [name] from Hospice. Noted on sheet was dose of coumadin as 3 mg M-F and 1.5 mg on Sat and Sun. Told [name] that coumadin is not on her medication list at [facility].   Dr. Marko Plume 11-23-13 note states that coumadin discontinued in 2012 due to GI Bleeding. Juliann Pulse states that daughter reported this to the nurse doing the admission assessment and the PT/INR needed repeating on 12-24-13.  Juliann Pulse will see if patient's PCP ordered coumadin and call back with an up date.

## 2013-12-29 ENCOUNTER — Ambulatory Visit (HOSPITAL_COMMUNITY): Payer: Medicare Other

## 2013-12-30 ENCOUNTER — Ambulatory Visit: Payer: Medicare Other | Admitting: Oncology

## 2013-12-30 ENCOUNTER — Other Ambulatory Visit: Payer: Medicare Other

## 2014-01-01 ENCOUNTER — Telehealth: Payer: Self-pay

## 2014-01-01 NOTE — Telephone Encounter (Signed)
Mailed signed Hospice treatment orders dated 01-01-14.  Sent copies to HIM to  be scanned into patient's EMR.

## 2014-01-08 NOTE — Telephone Encounter (Deleted)
<  ichelle

## 2014-01-08 NOTE — Telephone Encounter (Signed)
Told Wandra Arthurs RN with [facility] [phone]  That [name] PA with Dr. Melina Fiddler office doses Ms. Allen Kell coumadin as noted below in earlier July.  Sharyn Lull verbalized understanding.

## 2014-01-29 ENCOUNTER — Telehealth: Payer: Self-pay

## 2014-01-29 NOTE — Telephone Encounter (Signed)
         Jones Bales E ','<More Detail >>       Gordy Levan, MD       Sent: Fri January 29, 2014 2:34 PM    To: Baruch Merl, RN                   Message     Please let daughter know that nivolumab is not being used in small cell lung cancer, and her mother's active cancer now is the small cell type.    Tell her that I am glad she asked about this, but unfortunately it is not correct for Mrs. Murley.    ----- Message -----    From: Baruch Merl, RN    Sent: 01/29/2014 1:22 PM    To: Gordy Levan, MD

## 2014-01-29 NOTE — Telephone Encounter (Signed)
Ms. Lorne Skeens called to inquire whether or not Nivolumab would be beneficial for her mother or not at this point and time.  She stated that it was just approved by the FDA. Ms. Solorzano would be fine with revoking her hospice benefit is she were to take the medication. Told  Ms. Lorne Skeens that this note would be given to Dr. Marko Plume to review. Ms. Lorne Skeens will call back Wednesday8-26-15 if she has not heard from Dr. Mariana Kaufman office regarding medication.

## 2014-01-29 NOTE — Telephone Encounter (Signed)
Spoke with Ms. [name] and told her Dr. Mariana Kaufman response as noted below regarding the use of Nivolumab for her mother's cancer.  Daughter appreciated Dr. Marko Plume time.

## 2014-02-02 ENCOUNTER — Telehealth: Payer: Self-pay

## 2014-02-02 DIAGNOSIS — C3490 Malignant neoplasm of unspecified part of unspecified bronchus or lung: Secondary | ICD-10-CM

## 2014-02-02 MED ORDER — OXYCODONE-ACETAMINOPHEN 5-325 MG PO TABS
1.0000 | ORAL_TABLET | ORAL | Status: AC | PRN
Start: 2014-02-02 — End: ?

## 2014-02-02 NOTE — Telephone Encounter (Signed)
Hospice Nurse Wandra Arthurs  [phone]) called stating that [name] is having trouble with nausea which is decreasing her appetite.  The Zofran controlled the nausea, however, hospice will not cover the zofran.   The compazine helps but not as effectively as the Zofran.  Is there anything else that could be prescribed that may be more comparable to the Zofran?

## 2014-02-03 ENCOUNTER — Telehealth: Payer: Self-pay | Admitting: Oncology

## 2014-02-03 NOTE — Telephone Encounter (Signed)
Sent medical records to [facility] at [facility] at [facility].

## 2014-02-05 NOTE — Telephone Encounter (Signed)
         [facility] Bales E ','<More Detail >>       Gordy Levan, MD       Sent: Tue February 02, 2014 5:12 PM    To: Baruch Merl, RN                   Message     OTC ondansetron, tho I doubt this is what Hospice RN means, but otherwise nothing comparable.    I do not know how much ondansetron would cost daughter out of pocket at [facility], but could ask    I doubt any samples now, but could ask [place].        thanks    ----- Message -----    From: Baruch Merl, RN    Sent: 02/02/2014 4:49 PM    To: Gordy Levan, MD

## 2014-02-05 NOTE — Telephone Encounter (Signed)
Spoke with Wandra Arthurs RV and told her Dr. Mariana Kaufman response.   Sharyn Lull stated that Daughter probably could not pay out of pocket for Zofran. Suggested that they could discuss with their hospice doctor for recommendations.  Ms. [name] verbalized understanding.

## 2014-02-18 ENCOUNTER — Telehealth: Payer: Self-pay

## 2014-02-18 NOTE — Telephone Encounter (Signed)
Mailed signed orders dated 02-17-14 to Hospice in enclosed envelope. Sent a copy to HIM to be scanned into patient's EMR.

## 2014-03-01 ENCOUNTER — Telehealth: Payer: Self-pay

## 2014-03-01 ENCOUNTER — Encounter: Payer: Self-pay | Admitting: Gastroenterology

## 2014-03-01 NOTE — Telephone Encounter (Addendum)
Mailed supplemental orders dated 03-01-14 for hospice in return enevlope with attention to [place]. Sent a copy of the orders to HIM to be scanned into the patient's EMR.

## 2014-03-08 ENCOUNTER — Telehealth: Payer: Self-pay

## 2014-03-08 NOTE — Telephone Encounter (Signed)
Mailed  Signed orders dated 03-08-14  for care of patient passing Mar 09, 2014. Sent a copy of orders to HIM to be scanned into patient's EMR.

## 2014-03-11 DEATH — deceased

## 2014-09-28 IMAGING — CT CT CHEST W/ CM
2 of 4 series · 13 of 46 positions shown, 15 images · IV contrast (OMNIPAQUE)
Comparison: Chest CT 05/18/2013.  PET-CT 07/03/2013.

CLINICAL DATA: History of lung cancer status post wedge resection
in the right lung. Prior history of chemotherapy and radiation
therapy.

EXAM:
CT CHEST AND ABDOMEN WITH CONTRAST
TECHNIQUE: Multidetector CT imaging of the chest and abdomen was performed
following the standard protocol during bolus administration of
intravenous contrast.
CONTRAST:  100mL OMNIPAQUE IOHEXOL 300 MG/ML  SOLN

[Series 2: ca with st · axial · 0.84mm/px · z∈[-430,-104]mm · 10 of 79 slices shown, 12 images]
[im 7/79  soft-tissue]
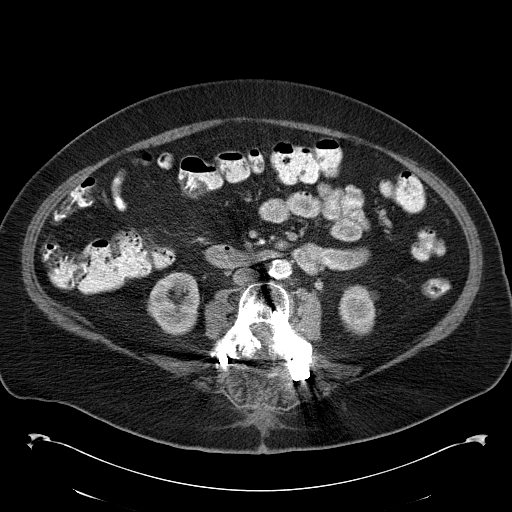
[im 7/79  bone]
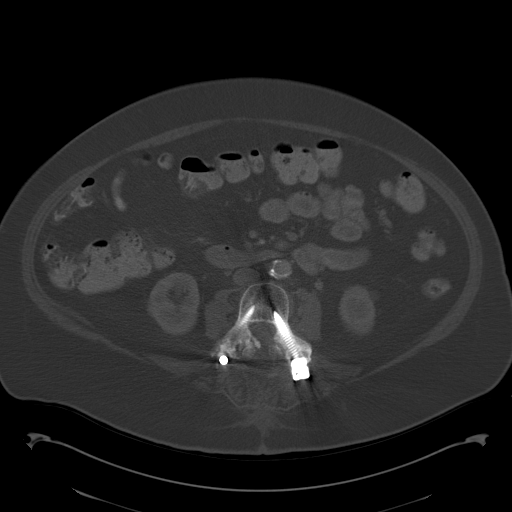
[im 14/79  soft-tissue]
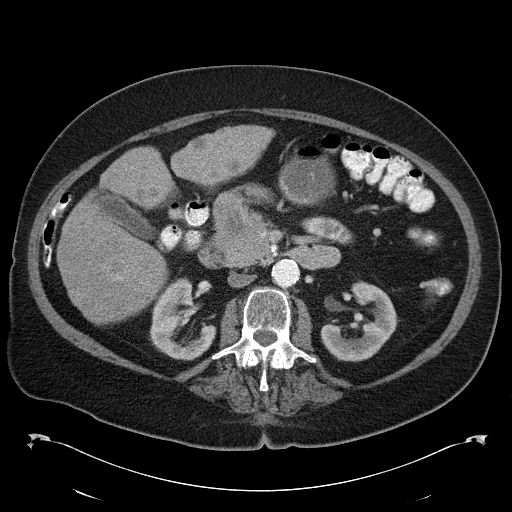
[im 21/79  soft-tissue]
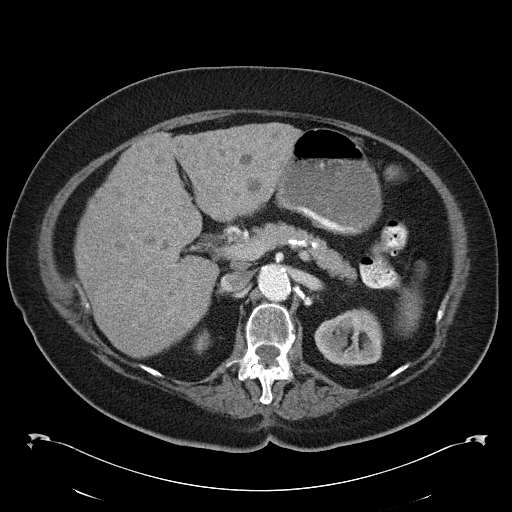
[im 28/79  soft-tissue]
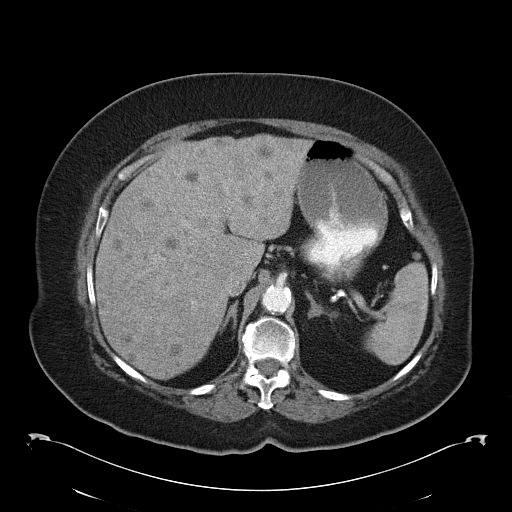
[im 34/79  soft-tissue]
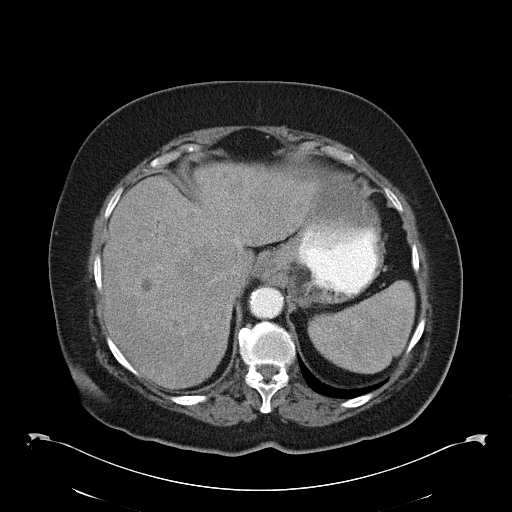
[im 45/79  soft-tissue]
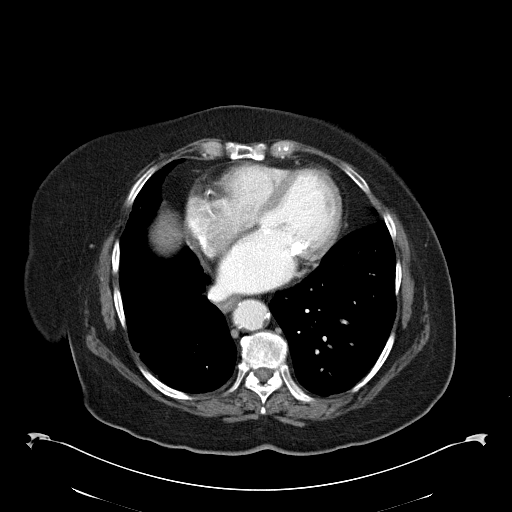
[im 51/79  soft-tissue]
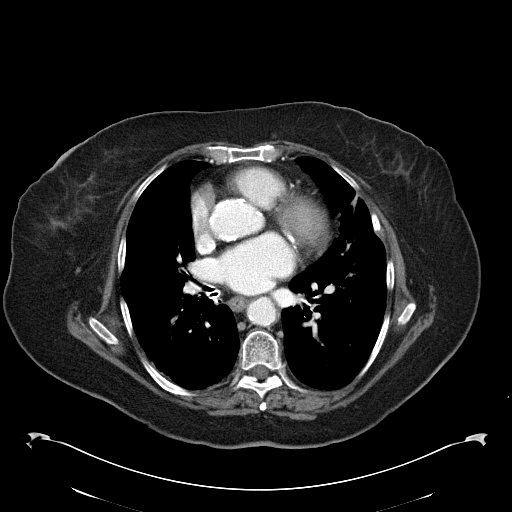
[im 58/79  soft-tissue]
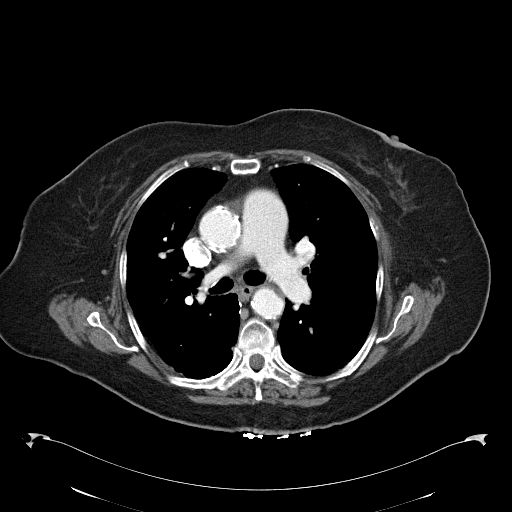
[im 65/79  soft-tissue]
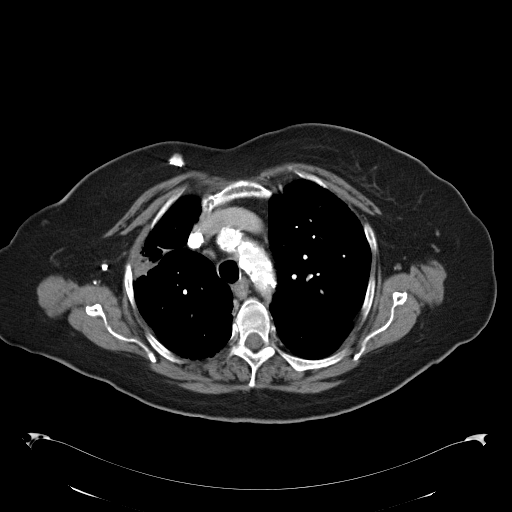
[im 65/79  bone]
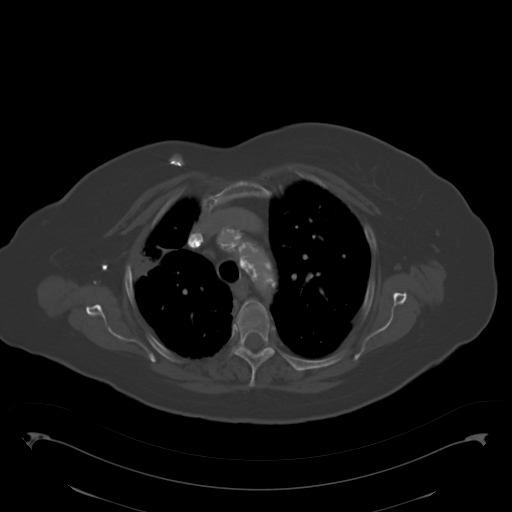
[im 72/79  soft-tissue]
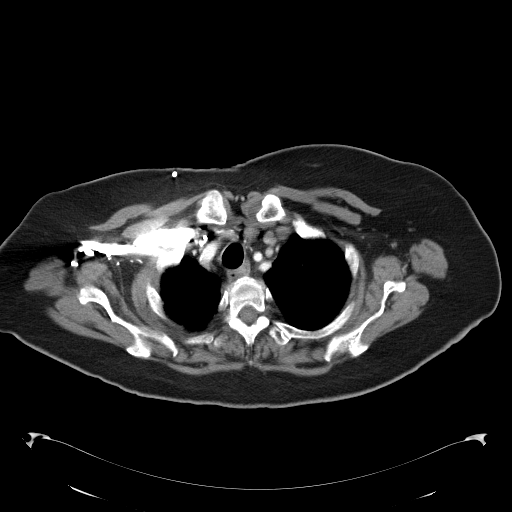

[Series 602: <mpr thick range> · coronal · 0.84mm/px · 3 of 99 slices shown]
[im 33/99  soft-tissue]
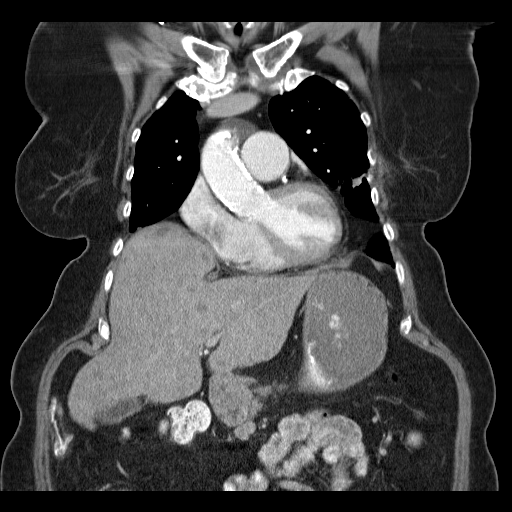
[im 44/99  soft-tissue]
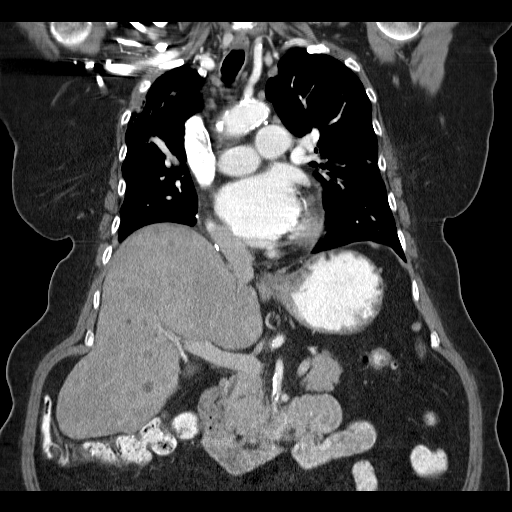
[im 55/99  soft-tissue]
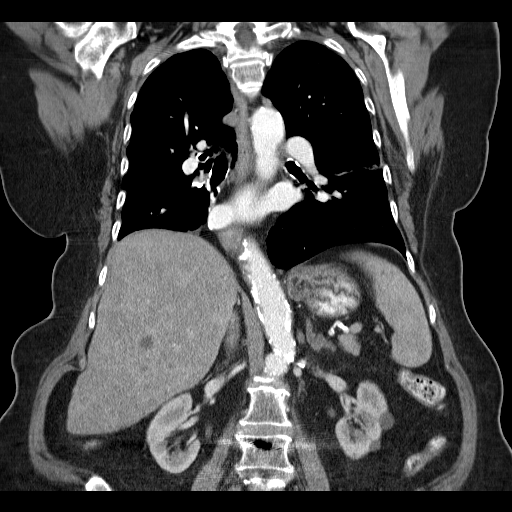

[13 of 46 positions shown; findings below may reference images not displayed]

FINDINGS: CT CHEST FINDINGS

Mediastinum: Heart size is mildly enlarged. There is no significant
pericardial fluid, thickening or pericardial calcification. There is
atherosclerosis of the thoracic aorta, the great vessels of the
mediastinum and the coronary arteries, including calcified
atherosclerotic plaque in the left main, left anterior descending,
left circumflex and right coronary arteries. Dilatation of the
pulmonic trunk (4.1 cm in diameter), which may suggest underlying
pulmonary arterial hypertension. No pathologically enlarged
mediastinal or hilar lymph nodes. Small hiatal hernia.

Lungs/Pleura: Peripheral nodular appearing area of architectural
distortion in the lateral aspect of the right upper lobe it is
similar to the prior study, and is nonspecific. On today's
examination, this region measures approximately 2.3 x 1.5 cm (image
15 of series 2), similar to the prior. There is also a
similar-appearing linear opacity in the inferior segment of the
lingula, and to a lesser extent in the lateral aspect of the left
lower lobe, favored to be posttreatment related changes at site of
prior radiation therapy given the linear appearance. 7 mm nodule in
the medial aspect of the apex of the right upper lobe (image 14 of
series 5) unchanged. No other definite new or enlarging suspicious
appearing pulmonary nodules or masses are noted. No pleural
effusions. Background of mild diffuse bronchial wall thickening with
mild centrilobular and paraseptal emphysema.

Musculoskeletal: There is a mixed lytic and sclerotic lesion in the
anterolateral aspect of the right second rib immediately superficial
to the previously described peripheral nodular appearing area of
architectural distortion in the lateral aspect of the right upper
lobe. This rib lesion has a pathologic fracture through it. These
findings are similar to prior examinations. There is also a healing
fracture of the anterolateral aspect of the left fourth rib, and a
lytic lesion with pathologic fracture in the lateral aspect of the
left fifth rib, which are new compared to prior examinations.

CT ABDOMEN AND PELVIS FINDINGS

Abdomen: Innumerable low-attenuation hepatic lesions are scattered
throughout the hepatic parenchyma, compatible with widespread
metastatic disease, with the largest lesion measuring up to 2.5 x
2.0 cm in the superior aspects of segments 7 and 8. The liver has a
very nodular contour, which is favored to be related to diffuse
metastatic disease (rather than pre-existing cirrhosis). In the head
of the pancreas there is an aggressive appearing somewhat
ill-defined enhancing soft tissue mass which is centrally
hypoenhancing with avid peripheral enhancement, measuring
approximately 6.0 x 4.5 cm, concerning for primary pancreatic
neoplasm. This extends anteriorly where it is intimately associated
with the undersurface of the duodenal bulb, and the medial surface
of the second portion of the duodenum. This is not associated with
significant pancreatic ductal dilatation at this time, nor is there
evidence for common bile duct dilatation as the common bile duct and
intrahepatic biliary tree are normal in caliber. Mildly enlarged
peripancreatic lymph node adjacent to the third portion of the
duodenum measuring 10 mm (image 73 of series 2). This pancreatic
mass is intimately associated with and nearly completely encases the
superior mesenteric vein, and this mass appears intimately
associated with the gastroduodenal artery. At this time, this mass
is in contact with the superior mesenteric artery oevr approximately
30% of its circumference. This mass appears separate from the portal
vein which remains patent at this time.

The appearance of the gallbladder, spleen and bilateral adrenal
glands is unremarkable. Kidneys are mildly atrophic bilaterally. Sub
cm low-attenuation lesions in the kidneys bilaterally too small to
definitively characterize. In addition there is a 1.6 cm
low-attenuation lesion in the lateral aspect of the interpolar
region of the left kidney, compatible with a small simple cyst.
Atherosclerosis throughout the abdominal and pelvic vasculature,
without evidence of aneurysm. There is a small stent in the
infrarenal abdominal aorta which appears grossly patent. Within the
visualized portions of the peritoneal cavity there is no significant
volume of ascites, no pneumoperitoneum and no pathologic distention
of small bowel.

Musculoskeletal: Orthopedic fixation hardware in the lumbar spine
incompletely visualized. There are no aggressive appearing lytic or
blastic lesions noted in the visualized portions of the skeleton.
IMPRESSION: 1. Today's study demonstrates progression of metastatic disease to
the liver and ribs, as detailed above. It is uncertain whether or
not this represents metastatic disease from lung cancer, or (more
likely) represents new metastatic disease from a large mass in the
head of the pancreas which is presumably a primary pancreatic
malignancy.
2. Previously noted areas in the lungs appears similar to prior
studies, favored to represent areas of post treatment related
change. However, if the patient has undergone recent chemotherapy,
the possibility of stable neoplasm is difficult to exclude.
Continued attention on future examinations is recommended.
3. Additional findings similar to prior studies, as above.

## 2015-11-26 ENCOUNTER — Other Ambulatory Visit: Payer: Self-pay | Admitting: Nurse Practitioner
